# Patient Record
Sex: Male | Born: 1970 | Race: Black or African American | Hispanic: No | Marital: Single | Smoking: Former smoker
Health system: Southern US, Community
[De-identification: ages and names within clinical notes are randomized; demographics above are authoritative.]

## PROBLEM LIST (undated history)

## (undated) DIAGNOSIS — R7989 Other specified abnormal findings of blood chemistry: Secondary | ICD-10-CM

## (undated) DIAGNOSIS — F1010 Alcohol abuse, uncomplicated: Secondary | ICD-10-CM

## (undated) DIAGNOSIS — F39 Unspecified mood [affective] disorder: Secondary | ICD-10-CM

## (undated) DIAGNOSIS — M069 Rheumatoid arthritis, unspecified: Secondary | ICD-10-CM

## (undated) DIAGNOSIS — F329 Major depressive disorder, single episode, unspecified: Secondary | ICD-10-CM

## (undated) DIAGNOSIS — S069XAA Unspecified intracranial injury with loss of consciousness status unknown, initial encounter: Secondary | ICD-10-CM

## (undated) DIAGNOSIS — F32A Depression, unspecified: Secondary | ICD-10-CM

## (undated) DIAGNOSIS — I209 Angina pectoris, unspecified: Secondary | ICD-10-CM

## (undated) DIAGNOSIS — E669 Obesity, unspecified: Secondary | ICD-10-CM

## (undated) DIAGNOSIS — S069X9A Unspecified intracranial injury with loss of consciousness of unspecified duration, initial encounter: Secondary | ICD-10-CM

## (undated) DIAGNOSIS — N529 Male erectile dysfunction, unspecified: Secondary | ICD-10-CM

## (undated) DIAGNOSIS — I2510 Atherosclerotic heart disease of native coronary artery without angina pectoris: Secondary | ICD-10-CM

## (undated) DIAGNOSIS — R945 Abnormal results of liver function studies: Secondary | ICD-10-CM

## (undated) DIAGNOSIS — E785 Hyperlipidemia, unspecified: Secondary | ICD-10-CM

## (undated) DIAGNOSIS — I10 Essential (primary) hypertension: Secondary | ICD-10-CM

## (undated) HISTORY — DX: Unspecified mood (affective) disorder: F39

## (undated) HISTORY — DX: Male erectile dysfunction, unspecified: N52.9

## (undated) HISTORY — PX: FRACTURE SURGERY: SHX138

## (undated) HISTORY — DX: Other specified abnormal findings of blood chemistry: R79.89

## (undated) HISTORY — DX: Hyperlipidemia, unspecified: E78.5

## (undated) HISTORY — DX: Angina pectoris, unspecified: I20.9

## (undated) HISTORY — PX: FOREARM FRACTURE SURGERY: SHX649

## (undated) HISTORY — DX: Alcohol abuse, uncomplicated: F10.10

## (undated) HISTORY — PX: CORONARY ANGIOPLASTY: SHX604

## (undated) HISTORY — DX: Obesity, unspecified: E66.9

## (undated) HISTORY — DX: Abnormal results of liver function studies: R94.5

---

## 2004-07-12 ENCOUNTER — Inpatient Hospital Stay (HOSPITAL_COMMUNITY): Admission: EM | Admit: 2004-07-12 | Discharge: 2004-07-22 | Payer: Self-pay

## 2004-07-22 ENCOUNTER — Ambulatory Visit: Payer: Self-pay | Admitting: Physical Medicine & Rehabilitation

## 2004-07-22 ENCOUNTER — Inpatient Hospital Stay (HOSPITAL_COMMUNITY)
Admission: RE | Admit: 2004-07-22 | Discharge: 2004-08-11 | Payer: Self-pay | Admitting: Physical Medicine & Rehabilitation

## 2004-08-18 ENCOUNTER — Encounter
Admission: RE | Admit: 2004-08-18 | Discharge: 2004-11-16 | Payer: Self-pay | Admitting: Physical Medicine & Rehabilitation

## 2004-09-15 ENCOUNTER — Encounter
Admission: RE | Admit: 2004-09-15 | Discharge: 2004-12-14 | Payer: Self-pay | Admitting: Physical Medicine & Rehabilitation

## 2004-09-17 ENCOUNTER — Ambulatory Visit: Payer: Self-pay | Admitting: Physical Medicine & Rehabilitation

## 2004-10-07 ENCOUNTER — Encounter: Admission: RE | Admit: 2004-10-07 | Discharge: 2005-01-05 | Payer: Self-pay | Admitting: Sports Medicine

## 2004-10-29 ENCOUNTER — Ambulatory Visit: Payer: Self-pay | Admitting: Physical Medicine & Rehabilitation

## 2005-01-23 ENCOUNTER — Encounter
Admission: RE | Admit: 2005-01-23 | Discharge: 2005-04-23 | Payer: Self-pay | Admitting: Physical Medicine & Rehabilitation

## 2005-01-27 ENCOUNTER — Ambulatory Visit: Payer: Self-pay | Admitting: Physical Medicine & Rehabilitation

## 2005-03-27 ENCOUNTER — Ambulatory Visit: Payer: Self-pay | Admitting: Physical Medicine & Rehabilitation

## 2005-06-24 ENCOUNTER — Encounter
Admission: RE | Admit: 2005-06-24 | Discharge: 2005-09-22 | Payer: Self-pay | Admitting: Physical Medicine & Rehabilitation

## 2005-06-24 ENCOUNTER — Ambulatory Visit: Payer: Self-pay | Admitting: Physical Medicine & Rehabilitation

## 2005-09-22 ENCOUNTER — Encounter
Admission: RE | Admit: 2005-09-22 | Discharge: 2005-12-21 | Payer: Self-pay | Admitting: Physical Medicine & Rehabilitation

## 2005-09-22 ENCOUNTER — Ambulatory Visit: Payer: Self-pay | Admitting: Physical Medicine & Rehabilitation

## 2006-03-08 ENCOUNTER — Encounter
Admission: RE | Admit: 2006-03-08 | Discharge: 2006-06-06 | Payer: Self-pay | Admitting: Physical Medicine & Rehabilitation

## 2006-03-08 ENCOUNTER — Ambulatory Visit: Payer: Self-pay | Admitting: Physical Medicine & Rehabilitation

## 2006-06-24 ENCOUNTER — Encounter
Admission: RE | Admit: 2006-06-24 | Discharge: 2006-09-22 | Payer: Self-pay | Admitting: Physical Medicine & Rehabilitation

## 2006-06-24 ENCOUNTER — Ambulatory Visit: Payer: Self-pay | Admitting: Physical Medicine & Rehabilitation

## 2006-09-24 ENCOUNTER — Encounter
Admission: RE | Admit: 2006-09-24 | Discharge: 2006-12-23 | Payer: Self-pay | Admitting: Physical Medicine & Rehabilitation

## 2006-09-24 ENCOUNTER — Ambulatory Visit: Payer: Self-pay | Admitting: Physical Medicine & Rehabilitation

## 2007-01-03 ENCOUNTER — Ambulatory Visit: Payer: Self-pay | Admitting: Physical Medicine & Rehabilitation

## 2007-01-03 ENCOUNTER — Encounter
Admission: RE | Admit: 2007-01-03 | Discharge: 2007-04-03 | Payer: Self-pay | Admitting: Physical Medicine & Rehabilitation

## 2007-02-28 ENCOUNTER — Ambulatory Visit: Payer: Self-pay | Admitting: Physical Medicine & Rehabilitation

## 2007-06-28 ENCOUNTER — Ambulatory Visit: Payer: Self-pay | Admitting: Physical Medicine & Rehabilitation

## 2007-06-28 ENCOUNTER — Encounter
Admission: RE | Admit: 2007-06-28 | Discharge: 2007-09-26 | Payer: Self-pay | Admitting: Physical Medicine & Rehabilitation

## 2007-08-22 ENCOUNTER — Ambulatory Visit: Payer: Self-pay | Admitting: Physical Medicine & Rehabilitation

## 2007-12-22 ENCOUNTER — Ambulatory Visit: Payer: Self-pay | Admitting: Physical Medicine & Rehabilitation

## 2007-12-22 ENCOUNTER — Encounter
Admission: RE | Admit: 2007-12-22 | Discharge: 2007-12-23 | Payer: Self-pay | Admitting: Physical Medicine & Rehabilitation

## 2008-06-15 ENCOUNTER — Encounter
Admission: RE | Admit: 2008-06-15 | Discharge: 2008-06-18 | Payer: Self-pay | Admitting: Physical Medicine & Rehabilitation

## 2008-06-18 ENCOUNTER — Ambulatory Visit: Payer: Self-pay | Admitting: Physical Medicine & Rehabilitation

## 2008-12-06 ENCOUNTER — Encounter
Admission: RE | Admit: 2008-12-06 | Discharge: 2008-12-07 | Payer: Self-pay | Admitting: Physical Medicine & Rehabilitation

## 2008-12-07 ENCOUNTER — Ambulatory Visit: Payer: Self-pay | Admitting: Physical Medicine & Rehabilitation

## 2009-06-12 ENCOUNTER — Ambulatory Visit: Payer: Self-pay | Admitting: Physical Medicine & Rehabilitation

## 2009-06-12 ENCOUNTER — Encounter
Admission: RE | Admit: 2009-06-12 | Discharge: 2009-06-12 | Payer: Self-pay | Admitting: Physical Medicine & Rehabilitation

## 2009-10-03 ENCOUNTER — Encounter
Admission: RE | Admit: 2009-10-03 | Discharge: 2009-11-26 | Payer: Self-pay | Admitting: Physical Medicine & Rehabilitation

## 2009-10-04 ENCOUNTER — Ambulatory Visit: Payer: Self-pay | Admitting: Physical Medicine & Rehabilitation

## 2009-12-31 ENCOUNTER — Encounter
Admission: RE | Admit: 2009-12-31 | Discharge: 2010-01-01 | Payer: Self-pay | Admitting: Physical Medicine & Rehabilitation

## 2010-01-01 ENCOUNTER — Ambulatory Visit: Payer: Self-pay | Admitting: Physical Medicine & Rehabilitation

## 2010-04-25 ENCOUNTER — Encounter
Admission: RE | Admit: 2010-04-25 | Discharge: 2010-04-30 | Payer: Self-pay | Admitting: Physical Medicine & Rehabilitation

## 2010-04-30 ENCOUNTER — Ambulatory Visit: Payer: Self-pay | Admitting: Physical Medicine & Rehabilitation

## 2010-10-14 ENCOUNTER — Encounter
Admission: RE | Admit: 2010-10-14 | Discharge: 2010-10-22 | Payer: Self-pay | Admitting: Physical Medicine & Rehabilitation

## 2010-10-22 ENCOUNTER — Ambulatory Visit: Payer: Self-pay | Admitting: Physical Medicine & Rehabilitation

## 2011-04-14 NOTE — Assessment & Plan Note (Signed)
Logan Sanchez is back regarding his brain injury.  He has been doing well from a  cognitive standpoint.  He continues taking his ritalin on a daily basis  as it helps his attention substantially.  He is on 10 mg b.i.d.  His  mood has been good.  He continues on Paxil 20 mg daily.  The patient  recently was laid off, however, he is hoping he will back at work in the  next couple of weeks.  He states he has lost some weight, but not  dramatically so.  He last worked on December 03, 2008.   REVIEW OF SYSTEMS:  Notable for occasional confusion and some weight  loss.  Other pertinent positives as above.  Full review is written out  in history section in the chart.   SOCIAL HISTORY:  The patient is single, still smokes one-pack per day.  He has a girlfriend and seems to be doing well in that area.   PHYSICAL EXAMINATION:  VITAL SIGNS:  Blood pressure is 172/95, pulse is  96, respiratory rate 18, and saturating 100% on room air.  GENERAL:  The patient is pleasant, alert, and oriented x3.  Affect is  quite appropriate.  Strength is 5/5.  He has good sensation.  Insight  and awareness are excellent.  He continues occasionally have problems  with focus, but is at baseline and seems to be compensating quite well.  HEART:  Regular.  CHEST:  Clear.  ABDOMEN:  Soft and nontender.  He has lost little bit of weight.   ASSESSMENT:  1. Traumatic brain injury.  2. Left partial rotator cuff tear.  3. Hypertension.   PLAN:  1. Continue Ritalin 10 b.i.d. and Paxil 20 mg daily.  These were      scheduled and not p.r.n. medications.  2. I will see him back in 6 months here for followup.  I have not pick      up refills in approximately 2 months' time.  I encouraged him      during his downtime to work on exercise and weight loss, which will      help lower his blood pressure.  If he has persistent blood pressure      elevation, he is to establish a PCP.      Ranelle Oyster, M.D.  Electronically  Signed     ZTS/MedQ  D:  12/07/2008 12:00:40  T:  12/08/2008 01:46:35  Job #:  [id number]

## 2011-04-14 NOTE — Assessment & Plan Note (Signed)
Logan Sanchez is here for follow up of his TBI.  He is having some problems with  anxiety and ongoing concerns of his memory.  He gets anxious over the  fact that he might have forgotten something.  He has in fact overdrafted  his bank account on several occasions.  He obsesses sometimes over  issues such whether he left his keys in the car or not or he left  something on in the house perhaps.  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  He is not having any pain at this point.  He is  employer is apparently happy with his work.   REVIEW OF SYSTEMS:  Notable for the above.  He continues to have  problems with his weight.  Full review is in the written health history  section of the chart.   SOCIAL HISTORY:  As noted above.  The patient is still smoking.  He  lives alone.   PHYSICAL EXAMINATION:  VITAL SIGNS:  Blood pressure 137/83, pulse 88,  respirations 18, sat 97% on room air.  GENERAL:  The patient is pleasant, alert and oriented x3.  NEUROLOGIC:  Affect is bright and appropriate.  Gait is generally  stable.  He is a bit anxious overall.  The sensory exam is within normal  limits.  Reflexes are 2+.  Motor functions 5/5.  The patient does have  problems with details and loses focus easily.  HEART:  Regular.  CHEST:  Clear.  ABDOMEN:  Soft and nontender.   ASSESSMENT:  1. Status post traumatic brain injury with ongoing memory and      attention deficits.  2. History of left partial rotator cuff tear.  3. Hypertension.   PLAN:  1. Continue to discuss issues to organize his life.  I think Ehsan has      to have a organization log or calendar that he keeps with him on a      regular basis.  He needs to set himself for success rather than      trying to wing things as he once did.  We discussed potential      options today.  2. Refilled Ritalin 10 mg twice a day.  3. We will begin trial Paxil 28 mg 1 p.o. q.a.m. for mood and anxiety.  4. I will see him back in about two  months.      Ranelle Oyster, M.D.  Electronically Signed     ZTS/MedQ  D:  06/29/2007 11:32:32  T:  06/30/2007 09:43:58  Job #:  [id number]
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
## 2011-04-14 NOTE — Assessment & Plan Note (Signed)
Logan Sanchez is back regarding his TBI. He is here today on 08/24/2007. Lyman  has done really well with the addition of the Paxil. It has decreased  his anxiety and depression quite significantly and it seems to be  working well with the Ritalin to give him better focus and better people  skills at work. He denies pain today. He is keeping a memory  book/calendar which is helping as well. He has had occasional diarrhea  he feels through the Paxil, but this is more than tolerable and really  minimal at this time. Cadan states that he is happy in his job and  appears to like it much more than he had previously. He is still working  30 hours per week.   REVIEW OF SYSTEMS:  Notable for the above. He does have occasional  tingling in the extremities. Full review is in the written health and  history section of the chart.   SOCIAL HISTORY:  The patient is living alone. He is still smoking a half  pack of cigarettes per day and work history is as noted above.   PHYSICAL EXAMINATION:  VITAL SIGNS:  Blood pressure 153/84, pulse 100,  respiratory rate 18. He is saturating 97% on room air.  The patient is pleasant. Awake, alert, and oriented x3. Affect is bright  and appropriate. Gait is stable. His mood is very good. Focus is  improved. He is still very pleasant to speak with.  HEART:  Regular rhythm, slightly tachycardic.  CHEST:  Clear.  ABDOMEN:  Soft and nontender.   ASSESSMENT:  1. Status post TBI.  2. History of left partial rotator cuff tear.  3. Hypertension.  4. Anxiety/depression.   PLAN:  Continue Ritalin and Paxil as ordered. I refilled Ritalin twice.  He will come back in two months to refill the Ritalin again and I will  see him in four months in the office for follow up.      Ranelle Oyster, M.D.  Electronically Signed     ZTS/MedQ  D:  08/24/2007 12:07:17  T:  08/25/2007 00:41:10  Job #:  [id number]
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
## 2012-08-15 ENCOUNTER — Encounter (HOSPITAL_COMMUNITY): Payer: Self-pay | Admitting: *Deleted

## 2012-08-15 ENCOUNTER — Emergency Department (HOSPITAL_COMMUNITY): Payer: Medicare Other

## 2012-08-15 ENCOUNTER — Emergency Department (HOSPITAL_COMMUNITY)
Admission: EM | Admit: 2012-08-15 | Discharge: 2012-08-15 | Disposition: A | Discharge status: Home / Self Care | Payer: Medicare Other | Attending: Emergency Medicine | Admitting: Emergency Medicine

## 2012-08-15 DIAGNOSIS — M79606 Pain in leg, unspecified: Secondary | ICD-10-CM

## 2012-08-15 DIAGNOSIS — F17200 Nicotine dependence, unspecified, uncomplicated: Secondary | ICD-10-CM | POA: Insufficient documentation

## 2012-08-15 DIAGNOSIS — M79609 Pain in unspecified limb: Secondary | ICD-10-CM | POA: Insufficient documentation

## 2012-08-15 DIAGNOSIS — I10 Essential (primary) hypertension: Secondary | ICD-10-CM | POA: Insufficient documentation

## 2012-08-15 HISTORY — DX: Essential (primary) hypertension: I10

## 2012-08-15 HISTORY — DX: Unspecified intracranial injury with loss of consciousness of unspecified duration, initial encounter: S06.9X9A

## 2012-08-15 HISTORY — DX: Unspecified intracranial injury with loss of consciousness status unknown, initial encounter: S06.9XAA

## 2012-08-15 MED ORDER — IBUPROFEN 400 MG PO TABS
800.0000 mg | ORAL_TABLET | Freq: Once | ORAL | Status: AC
  Administered 2012-08-15: 800 mg via ORAL
  Filled 2012-08-15: qty 2
  Filled 2012-08-15: qty 1

## 2012-08-15 NOTE — ED Provider Notes (Addendum)
History  This chart was scribed for Richardean Canal, MD by Bennett Scrape. This patient was seen in room [facility] and the patient's care was started at 2011.  CSN: [id number]  Arrival date & time 08/15/12  1745   First MD Initiated Contact with Patient 08/15/12 2011      Chief Complaint  Patient presents with  . Leg Pain    The history is provided by the patient. No language interpreter was used.    Logan Sanchez is a 41 y.o. male who presents to the Emergency Department complaining of 3 weeks of left buttock pain that radiates into his posterior left thigh, left knee and left calf that noticed at work. He denies any recent falls or injuries. He reports taking tylenol with no improvement in his symptoms. He denies having prior episodes of similar symptoms. He states that he has been going to [facility] for high cholesterol and told to come to the ED for possible DVT. He denies fever, nausea, emesis, chills and rash as associated symptoms. He has a h/o brain injury and HTN, last visit to PCP was one year ago.  He is a current everyday smoker and occasional alcohol user.  Past Medical History  Diagnosis Date  . Brain injury 2005  . Hypertension     History reviewed. No pertinent past surgical history.  No family history on file.  History  Substance Use Topics  . Smoking status: Current Every Day Smoker  . Smokeless tobacco: Not on file  . Alcohol Use: Yes      Review of Systems  Constitutional: Negative for fever and chills.  Gastrointestinal: Negative for nausea and vomiting.  Musculoskeletal: Negative for back pain.       Positive for left buttock, left thigh, left knee and left calf pain  Skin: Negative for rash.    Allergies  Review of patient's allergies indicates no known allergies.  Home Medications   Current Outpatient Rx  Name Route Sig Dispense Refill  . OMEGA-3 FATTY ACIDS 1000 MG PO CAPS Oral Take 1 g by mouth daily.    Marland Kitchen GNP GINGKO BILOBA EXTRACT  PO Oral Take 1 capsule by mouth daily.    Marland Kitchen GINSENG COMPLEX PO Oral Take 1 tablet by mouth daily.      Triage Vitals: BP 148/104  Pulse 114  Temp 98.6 F (37 C) (Oral)  Resp 18  SpO2 99%  Physical Exam  Nursing note and vitals reviewed. Constitutional: He is oriented to person, place, and time. He appears well-developed and well-nourished. No distress.  HENT:  Head: Normocephalic and atraumatic.  Eyes: EOM are normal.  Neck: Neck supple. No tracheal deviation present.  Cardiovascular: Normal rate.   Pulmonary/Chest: Effort normal. No respiratory distress.  Musculoskeletal: Normal range of motion. He exhibits tenderness.       Mild tenderness along the left posterior calf, mild tenderness along the left anterior thigh, no pain with ROM of hip or left knee  Neurological: He is alert and oriented to person, place, and time.  Skin: Skin is warm and dry.  Psychiatric: He has a normal mood and affect. His behavior is normal.    ED Course  Procedures (including critical care time)  DIAGNOSTIC STUDIES: Oxygen Saturation is 99% on room air, normal by my interpretation.    COORDINATION OF CARE: 2017-Discussed treatment plan which includes DVT procedures and x-rays with pt at bedside and pt agreed to plan.   Labs Reviewed - No data to display  Dg Hip Complete Left  08/15/2012  *RADIOLOGY REPORT*  Clinical Data: Left hip pain.  LEFT HIP - COMPLETE 2+ VIEW  Comparison: None.  Findings: Imaged bones, joints and soft tissues appear normal.  IMPRESSION: Negative exam.   Original Report Authenticated By: Bernadene Bell. D'ALESSIO, M.D.      1. Leg pain       MDM  Logan Sanchez is a 41 y.o. male here with chronic L leg pain likely MSK. Xrays showed no fracture. Low suspicion for DVT given patient has no other risk factors. Since vascular lab is closed, I ordered duplex for AM. I offered lovenox shot but patient refused. Patient will come in AM to get duplex of L leg to r/o DVT. Patient's HR  decreased to 97 on discharge as per nurse (triage HR elevated likely due to pain).   This document was completed by the scribe at my direction and I have reviewed its accuracy. I have personally examined the patient and agrees with the above document.   Chaney Malling, MD        Richardean Canal, MD 08/15/12 2134  Richardean Canal, MD 08/15/12 2209

## 2012-08-15 NOTE — ED Notes (Signed)
The pt has had lt leg pain for 2 months.  No known injury.  He has insurance so he wanted to check it out.  Past brain injury

## 2012-08-16 ENCOUNTER — Ambulatory Visit (HOSPITAL_COMMUNITY)
Admission: RE | Admit: 2012-08-16 | Discharge: 2012-08-16 | Disposition: A | Discharge status: Home / Self Care | Payer: Medicare Other | Source: Ambulatory Visit | Attending: Emergency Medicine | Admitting: Emergency Medicine

## 2012-08-16 DIAGNOSIS — M79609 Pain in unspecified limb: Secondary | ICD-10-CM

## 2012-08-16 NOTE — Progress Notes (Signed)
*  Preliminary Results* Left lower extremity venous duplex completed. Left lower extremity is negative for deep vein thrombosis.  08/16/2012 10:29 AM Gertie Fey, RDMS, RDCS

## 2012-12-28 ENCOUNTER — Ambulatory Visit: Payer: Medicare Other | Admitting: Family

## 2013-01-02 ENCOUNTER — Encounter: Payer: Self-pay | Admitting: Family

## 2013-01-02 ENCOUNTER — Ambulatory Visit (INDEPENDENT_AMBULATORY_CARE_PROVIDER_SITE_OTHER): Payer: Medicare Other | Admitting: Family

## 2013-01-02 VITALS — BP 176/102 | HR 103 | Temp 98.6°F | Ht 68.0 in | Wt 310.0 lb

## 2013-01-02 DIAGNOSIS — E669 Obesity, unspecified: Secondary | ICD-10-CM

## 2013-01-02 DIAGNOSIS — M545 Low back pain, unspecified: Secondary | ICD-10-CM

## 2013-01-02 DIAGNOSIS — M5430 Sciatica, unspecified side: Secondary | ICD-10-CM

## 2013-01-02 DIAGNOSIS — I10 Essential (primary) hypertension: Secondary | ICD-10-CM

## 2013-01-02 LAB — POCT URINALYSIS DIPSTICK
Blood, UA: NEGATIVE
Glucose, UA: NEGATIVE
Ketones, UA: NEGATIVE
Spec Grav, UA: >=1.03
Urobilinogen, UA: 1
pH, UA: 5.5

## 2013-01-02 LAB — COMPREHENSIVE METABOLIC PANEL
ALT: 43 U/L (ref 0–53)
Albumin: 3.8 g/dL (ref 3.5–5.2)
Alkaline Phosphatase: 92 U/L (ref 39–117)
BUN: 13 mg/dL (ref 6–23)
CO2: 26 mEq/L (ref 19–32)
Chloride: 102 mEq/L (ref 96–112)
GFR: 91.2 mL/min (ref >60.00)
Glucose, Bld: 112 mg/dL — ABNORMAL HIGH (ref 70–99)
Potassium: 4 mEq/L (ref 3.5–5.1)
Total Bilirubin: 0.9 mg/dL (ref 0.3–1.2)

## 2013-01-02 LAB — CBC WITH DIFFERENTIAL/PLATELET
Basophils Absolute: 0.1 10*3/uL (ref 0.0–0.1)
Basophils Relative: 1.1 % (ref 0.0–3.0)
Eosinophils Relative: 4.1 % (ref 0.0–5.0)
HCT: 44.4 % (ref 39.0–52.0)
Lymphocytes Relative: 36.9 % (ref 12.0–46.0)
Lymphs Abs: 2.5 10*3/uL (ref 0.7–4.0)
MCHC: 35 g/dL (ref 30.0–36.0)
Monocytes Absolute: 0.4 10*3/uL (ref 0.1–1.0)
Neutro Abs: 3.5 10*3/uL (ref 1.4–7.7)
Neutrophils Relative %: 51.4 % (ref 43.0–77.0)

## 2013-01-02 LAB — LIPID PANEL: Triglycerides: 597 mg/dL — ABNORMAL HIGH (ref 0.0–149.0)

## 2013-01-02 LAB — LDL CHOLESTEROL, DIRECT: Direct LDL: 112.5 mg/dL

## 2013-01-02 LAB — TSH: TSH: 1.1 u[IU]/mL (ref 0.35–5.50)

## 2013-01-02 MED ORDER — DICLOFENAC SODIUM 75 MG PO TBEC: 75.0000 mg | DELAYED_RELEASE_TABLET | Freq: Two times a day (BID) | ORAL | Status: DC

## 2013-01-02 MED ORDER — LISINOPRIL-HYDROCHLOROTHIAZIDE 20-25 MG PO TABS: 1.0000 | ORAL_TABLET | Freq: Every day | ORAL | Status: DC

## 2013-01-02 MED ORDER — CYCLOBENZAPRINE HCL 10 MG PO TABS: 10.0000 mg | ORAL_TABLET | Freq: Three times a day (TID) | ORAL | Status: DC | PRN

## 2013-01-02 NOTE — Patient Instructions (Addendum)
Sciatica Sciatica is pain, weakness, numbness, or tingling along the path of the sciatic nerve. The nerve starts in the lower back and runs down the back of each leg. The nerve controls the muscles in the lower leg and in the back of the knee, while also providing sensation to the back of the thigh, lower leg, and the sole of your foot. Sciatica is a symptom of another medical condition. For instance, nerve damage or certain conditions, such as a herniated disk or bone spur on the spine, pinch or put pressure on the sciatic nerve. This causes the pain, weakness, or other sensations normally associated with sciatica. Generally, sciatica only affects one side of the body. CAUSES   Herniated or slipped disc.  Degenerative disk disease.  A pain disorder involving the narrow muscle in the buttocks (piriformis syndrome).  Pelvic injury or fracture.  Pregnancy.  Tumor (rare). SYMPTOMS  Symptoms can vary from mild to very severe. The symptoms usually travel from the low back to the buttocks and down the back of the leg. Symptoms can include:  Mild tingling or dull aches in the lower back, leg, or hip.  Numbness in the back of the calf or sole of the foot.  Burning sensations in the lower back, leg, or hip.  Sharp pains in the lower back, leg, or hip.  Leg weakness.  Severe back pain inhibiting movement. These symptoms may get worse with coughing, sneezing, laughing, or prolonged sitting or standing. Also, being overweight may worsen symptoms. DIAGNOSIS  Your caregiver will perform a physical exam to look for common symptoms of sciatica. He or she may ask you to do certain movements or activities that would trigger sciatic nerve pain. Other tests may be performed to find the cause of the sciatica. These may include:  Blood tests.  X-rays.  Imaging tests, such as an MRI or CT scan. TREATMENT  Treatment is directed at the cause of the sciatic pain. Sometimes, treatment is not necessary  and the pain and discomfort goes away on its own. If treatment is needed, your caregiver may suggest:  Over-the-counter medicines to relieve pain.  Prescription medicines, such as anti-inflammatory medicine, muscle relaxants, or narcotics.  Applying heat or ice to the painful area.  Steroid injections to lessen pain, irritation, and inflammation around the nerve.  Reducing activity during periods of pain.  Exercising and stretching to strengthen your abdomen and improve flexibility of your spine. Your caregiver may suggest losing weight if the extra weight makes the back pain worse.  Physical therapy.  Surgery to eliminate what is pressing or pinching the nerve, such as a bone spur or part of a herniated disk. HOME CARE INSTRUCTIONS   Only take over-the-counter or prescription medicines for pain or discomfort as directed by your caregiver.  Apply ice to the affected area for 20 minutes, 3 4 times a day for the first 48 72 hours. Then try heat in the same way.  Exercise, stretch, or perform your usual activities if these do not aggravate your pain.  Attend physical therapy sessions as directed by your caregiver.  Keep all follow-up appointments as directed by your caregiver.  Do not wear high heels or shoes that do not provide proper support.  Check your mattress to see if it is too soft. A firm mattress may lessen your pain and discomfort. SEEK IMMEDIATE MEDICAL CARE IF:   You lose control of your bowel or bladder (incontinence).  You have increasing weakness in the lower back,   pelvis, buttocks, or legs.  You have redness or swelling of your back.  You have a burning sensation when you urinate.  You have pain that gets worse when you lie down or awakens you at night.  Your pain is worse than you have experienced in the past.  Your pain is lasting longer than 4 weeks.  You are suddenly losing weight without reason. MAKE SURE YOU:  Understand these  instructions.  Will watch your condition.  Will get help right away if you are not doing well or get worse. Document Released: 11/10/2001 Document Revised: 05/17/2012 Document Reviewed: 03/27/2012 [facility] Patient Information 2013 [place], [place].   Exercise to Lose Weight Exercise and a healthy diet may help you lose weight. Your doctor may suggest specific exercises. EXERCISE IDEAS AND TIPS  Choose low-cost things you enjoy doing, such as walking, bicycling, or exercising to workout videos.  Take stairs instead of the elevator.  Walk during your lunch break.  Park your car further away from work or school.  Go to a gym or an exercise class.  Start with 5 to 10 minutes of exercise each day. Build up to 30 minutes of exercise 4 to 6 days a week.  Wear shoes with good support and comfortable clothes.  Stretch before and after working out.  Work out until you breathe harder and your heart beats faster.  Drink extra water when you exercise.  Do not do so much that you hurt yourself, feel dizzy, or get very short of breath. Exercises that burn about 150 calories:  Running 1  miles in 15 minutes.  Playing volleyball for 45 to 60 minutes.  Washing and waxing a car for 45 to 60 minutes.  Playing touch football for 45 minutes.  Walking 1  miles in 35 minutes.  Pushing a stroller 1  miles in 30 minutes.  Playing basketball for 30 minutes.  Raking leaves for 30 minutes.  Bicycling 5 miles in 30 minutes.  Walking 2 miles in 30 minutes.  Dancing for 30 minutes.  Shoveling snow for 15 minutes.  Swimming laps for 20 minutes.  Walking up stairs for 15 minutes.  Bicycling 4 miles in 15 minutes.  Gardening for 30 to 45 minutes.  Jumping rope for 15 minutes.  Washing windows or floors for 45 to 60 minutes. Document Released: 12/19/2010 Document Revised: 02/08/2012 Document Reviewed: 12/19/2010 [facility] Patient Information 2013 [place], [place].

## 2013-01-02 NOTE — Progress Notes (Signed)
  Subjective:    Patient ID: Logan Sanchez, male    DOB: [birth year], 42 y.o.   MRN: [id number]  HPI 42 year old AAM, nonsmoker, is in today to be established. He has a history of Hypertension and Obesity. Has been on blood pressure meds in the past but has been off x 2 years. He has c/o low back pain that radiates down his left leg x 1-2 months. Has been taking Aleve with no relief.  Rates the pain as 7/10, constant. Worse with movement. Reports weight gain over the last several months.    Review of Systems  Constitutional: Negative.   Respiratory: Negative.   Cardiovascular: Negative.   Musculoskeletal: Positive for back pain.       Left leg pain  Skin: Negative.   Neurological: Negative.   Hematological: Negative.   Psychiatric/Behavioral: Negative.    Past Medical History  Diagnosis Date  . Brain injury   . Hypertension     History   Social History  . Marital Status: Single    Spouse Name: N/A    Number of Children: N/A  . Years of Education: N/A   Occupational History  . Not on file.   Social History Main Topics  . Smoking status: Current Every Day Smoker  . Smokeless tobacco: Not on file  . Alcohol Use: Yes  . Drug Use:   . Sexually Active:    Other Topics Concern  . Not on file   Social History Narrative  . No narrative on file    No past surgical history on file.  No family history on file.  No Known Allergies  Current Outpatient Prescriptions on File Prior to Visit  Medication Sig Dispense Refill  . fish oil-omega-3 fatty acids 1000 MG capsule Take 1 g by mouth daily.      . Ginkgo Biloba (GNP GINGKO BILOBA EXTRACT PO) Take 1 capsule by mouth daily.      Marland Kitchen lisinopril-hydrochlorothiazide (PRINZIDE,ZESTORETIC) 20-25 MG per tablet Take 1 tablet by mouth daily.  30 tablet  3  . Misc Natural Products (GINSENG COMPLEX PO) Take 1 tablet by mouth daily.        BP 176/102  Pulse 103  Temp 98.6 F (37 C) (Oral)  Ht 5\' 8"  (1.727 m)  Wt 310 lb (140.615  kg)  BMI 47.14 kg/m2  SpO2 98%chart    Objective:   Physical Exam  Constitutional: He is oriented to person, place, and time. He appears well-developed and well-nourished.       Obese  HENT:  Right Ear: External ear normal.  Left Ear: External ear normal.  Nose: Nose normal.  Mouth/Throat: Oropharynx is clear and moist.  Neck: Normal range of motion. Neck supple.  Cardiovascular: Normal rate, regular rhythm and normal heart sounds.   Pulmonary/Chest: Effort normal and breath sounds normal.  Abdominal: Soft. Bowel sounds are normal.  Musculoskeletal: Normal range of motion.  Neurological: He is alert and oriented to person, place, and time. He has normal reflexes.  Skin: Skin is warm and dry.  Psychiatric: He has a normal mood and affect.          Assessment & Plan:  Assessment:  1. Hypertension-Uncontrolled 2. Obesity 3. Low back pain 4. Sciatica  Plan: Flexeril 10mg  three times a day. Voltaren 75mg  twice a day with food. Xray lspine.  Start Lisinopril HCT 20/25 once a day. Exercise to reduce weight. Labs sent. Return in 3 weeks for CPX

## 2013-01-04 ENCOUNTER — Other Ambulatory Visit: Payer: Self-pay | Admitting: Family

## 2013-01-04 MED ORDER — OMEGA-3-ACID ETHYL ESTERS 1 G PO CAPS: 2.0000 g | ORAL_CAPSULE | Freq: Two times a day (BID) | ORAL | Status: DC

## 2013-01-09 ENCOUNTER — Ambulatory Visit (HOSPITAL_COMMUNITY)
Admission: RE | Admit: 2013-01-09 | Discharge: 2013-01-09 | Disposition: A | Discharge status: Home / Self Care | Payer: Medicare Other | Source: Ambulatory Visit | Attending: Family | Admitting: Family

## 2013-01-09 DIAGNOSIS — M545 Low back pain, unspecified: Secondary | ICD-10-CM | POA: Insufficient documentation

## 2013-01-09 DIAGNOSIS — M5430 Sciatica, unspecified side: Secondary | ICD-10-CM

## 2013-01-23 ENCOUNTER — Ambulatory Visit (INDEPENDENT_AMBULATORY_CARE_PROVIDER_SITE_OTHER): Payer: Medicare Other | Admitting: Family

## 2013-01-23 ENCOUNTER — Encounter: Payer: Self-pay | Admitting: Family

## 2013-01-23 VITALS — BP 114/80 | HR 108 | Wt 299.5 lb

## 2013-01-23 DIAGNOSIS — M545 Low back pain: Secondary | ICD-10-CM

## 2013-01-23 MED ORDER — LOSARTAN POTASSIUM-HCTZ 100-12.5 MG PO TABS: 1.0000 | ORAL_TABLET | Freq: Every day | ORAL | Status: DC

## 2013-01-23 NOTE — Progress Notes (Signed)
Subjective:    Patient ID: Logan Sanchez, male    DOB: 05/16/1971, 42 y.o.   MRN: [id number]  HPI 42 year old [place] American male, nonsmoker is in for recheck of hypertension. He's currently on hydrochlorothiazide and lisinopril 20/25 once daily. He tolerates the medication well except having erectile dysfunction. He now has difficulty achieving and maintaining an erection. And denies any lightheadedness, dizziness, chest pain, palpitations, shortness of breath or edema.   Review of Systems  Constitutional: Negative.   HENT: Negative.   Respiratory: Negative.   Cardiovascular: Negative.   Endocrine: Negative.   Genitourinary: Negative.   Musculoskeletal: Negative.   Skin: Negative.   Neurological: Negative.   Hematological: Negative.   Psychiatric/Behavioral: Negative.    Past Medical History  Diagnosis Date  . Brain injury   . Hypertension     History   Social History  . Marital Status: Single    Spouse Name: N/A    Number of Children: N/A  . Years of Education: N/A   Occupational History  . Not on file.   Social History Main Topics  . Smoking status: Current Every Day Smoker  . Smokeless tobacco: Not on file  . Alcohol Use: Yes  . Drug Use:   . Sexually Active:    Other Topics Concern  . Not on file   Social History Narrative  . No narrative on file    History reviewed. No pertinent past surgical history.  No family history on file.  No Known Allergies  Current Outpatient Prescriptions on File Prior to Visit  Medication Sig Dispense Refill  . cyclobenzaprine (FLEXERIL) 10 MG tablet Take 1 tablet (10 mg total) by mouth 3 (three) times daily as needed for muscle spasms.  30 tablet  0  . diclofenac (VOLTAREN) 75 MG EC tablet Take 1 tablet (75 mg total) by mouth 2 (two) times daily.  60 tablet  1  . fish oil-omega-3 fatty acids 1000 MG capsule Take 1 g by mouth daily.      . Ginkgo Biloba (GNP GINGKO BILOBA EXTRACT PO) Take 1 capsule by mouth daily.       . Misc Natural Products (GINSENG COMPLEX PO) Take 1 tablet by mouth daily.      Marland Kitchen omega-3 acid ethyl esters (LOVAZA) 1 G capsule Take 2 capsules (2 g total) by mouth 2 (two) times daily.  120 capsule  3   No current facility-administered medications on file prior to visit.    BP 114/80  Pulse 108  Wt 299 lb 8 oz (135.852 kg)  BMI 45.55 kg/m2  SpO2 94%chart    Objective:   Physical Exam  Constitutional: He is oriented to person, place, and time. He appears well-developed and well-nourished.  HENT:  Right Ear: External ear normal.  Left Ear: External ear normal.  Nose: Nose normal.  Mouth/Throat: Oropharynx is clear and moist.  Neck: Normal range of motion. Neck supple. No thyromegaly present.  Cardiovascular: Normal rate, regular rhythm and normal heart sounds.   Pulmonary/Chest: Effort normal and breath sounds normal.  Abdominal: Soft. Bowel sounds are normal.  Musculoskeletal: Normal range of motion.  Neurological: He is alert and oriented to person, place, and time.  Skin: Skin is warm and dry.  Psychiatric: He has a normal mood and affect.          Assessment & Plan:  Assessment: 1. Hypertension 2. Erectile Dysfunction-uncontrolled  Plan: Testosterone level sent. DC lisinopril HCT. Try Hyzaar 100/12.5 once daily. Recheck in 6 weeks since  he was to continue on his current prescription. Sample of Cialis 5 mg once daily was given. We'll follow with patient in 6 weeks and sooner as needed.

## 2013-01-23 NOTE — Patient Instructions (Addendum)

## 2013-02-08 ENCOUNTER — Other Ambulatory Visit: Payer: Self-pay

## 2013-02-08 MED ORDER — CYCLOBENZAPRINE HCL 10 MG PO TABS: 10.0000 mg | ORAL_TABLET | Freq: Three times a day (TID) | ORAL | Status: DC | PRN

## 2013-03-06 ENCOUNTER — Ambulatory Visit (INDEPENDENT_AMBULATORY_CARE_PROVIDER_SITE_OTHER): Payer: Medicare Other | Admitting: Family

## 2013-03-06 ENCOUNTER — Encounter: Payer: Self-pay | Admitting: Family

## 2013-03-06 VITALS — BP 122/88 | HR 108 | Wt 303.0 lb

## 2013-03-06 DIAGNOSIS — N529 Male erectile dysfunction, unspecified: Secondary | ICD-10-CM

## 2013-03-06 DIAGNOSIS — M5430 Sciatica, unspecified side: Secondary | ICD-10-CM

## 2013-03-06 DIAGNOSIS — I10 Essential (primary) hypertension: Secondary | ICD-10-CM

## 2013-03-06 DIAGNOSIS — M5432 Sciatica, left side: Secondary | ICD-10-CM

## 2013-03-06 MED ORDER — LOSARTAN POTASSIUM-HCTZ 100-25 MG PO TABS: 1.0000 | ORAL_TABLET | Freq: Every day | ORAL | Status: DC

## 2013-03-06 NOTE — Progress Notes (Signed)
Subjective:    Patient ID: Logan Sanchez, male    DOB: 10-13-1971, 42 y.o.   MRN: [id number]  HPI 42 year old [place] American male, patient of Dr. Orvan Falconer that is in today for a blood pressure follow-up. He verbalizes he has gained a few pounds since last visit, attributing it it to cheating a little on his diet. Reports he has not refrained from adding salt to foods and has started drinking sodas again. He reports he consumes a bottle of red wine weekly. Denies headaches or edema in extremities. Plans to engage in walking activities when weather breaks.  Tolerating BP medications well, ED stable.  He has a history of sciatica and reports that the pain in his left buttocks and leg have improved with medications, reports pain only in the buttocks region, taking Voltaren 75mg  as needed,     Review of Systems  Constitutional: Negative.   HENT: Negative.   Eyes: Negative.   Respiratory: Negative.   Cardiovascular: Negative.   Gastrointestinal: Negative.   Endocrine: Negative.   Genitourinary: Negative.   Musculoskeletal: Positive for back pain.       Sciatica pain improved, pain only in buttocks region, no longer having pain in left leg,   Skin: Negative.   Allergic/Immunologic: Negative.   Neurological: Negative.   Hematological: Negative.   Psychiatric/Behavioral: Negative.    Past Medical History  Diagnosis Date  . Brain injury   . Hypertension     History   Social History  . Marital Status: Single    Spouse Name: N/A    Number of Children: N/A  . Years of Education: N/A   Occupational History  . Not on file.   Social History Main Topics  . Smoking status: Current Every Day Smoker  . Smokeless tobacco: Not on file  . Alcohol Use: Yes  . Drug Use:   . Sexually Active:    Other Topics Concern  . Not on file   Social History Narrative  . No narrative on file    History reviewed. No pertinent past surgical history.  No family history on file.  No Known  Allergies  Current Outpatient Prescriptions on File Prior to Visit  Medication Sig Dispense Refill  . cyclobenzaprine (FLEXERIL) 10 MG tablet Take 1 tablet (10 mg total) by mouth 3 (three) times daily as needed for muscle spasms.  30 tablet  0  . diclofenac (VOLTAREN) 75 MG EC tablet Take 1 tablet (75 mg total) by mouth 2 (two) times daily.  60 tablet  1  . fish oil-omega-3 fatty acids 1000 MG capsule Take 1 g by mouth daily.      . Ginkgo Biloba (GNP GINGKO BILOBA EXTRACT PO) Take 1 capsule by mouth daily.      . Misc Natural Products (GINSENG COMPLEX PO) Take 1 tablet by mouth daily.      Marland Kitchen omega-3 acid ethyl esters (LOVAZA) 1 G capsule Take 2 capsules (2 g total) by mouth 2 (two) times daily.  120 capsule  3   No current facility-administered medications on file prior to visit.    BP 122/88  Pulse 108  Wt 303 lb (137.44 kg)  BMI 46.08 kg/m2  SpO2 94%chart     Objective:   Physical Exam  Vitals reviewed. Constitutional: He is oriented to person, place, and time. He appears well-nourished. No distress.  HENT:  Head: Normocephalic.  Right Ear: External ear normal.  Left Ear: External ear normal.  Nose: Nose normal.  Mouth/Throat:  No oropharyngeal exudate.  Eyes: Pupils are equal, round, and reactive to light.  Neck: Normal range of motion. Neck supple. No JVD present. No tracheal deviation present. No thyromegaly present.  Cardiovascular: Normal rate, regular rhythm, normal heart sounds and intact distal pulses.   Pulmonary/Chest: Effort normal and breath sounds normal. No stridor.  Abdominal: Soft. Bowel sounds are normal.  Neurological: He is alert and oriented to person, place, and time.  Skin: Skin is warm and dry. He is not diaphoretic.  Psychiatric: He has a normal mood and affect. His behavior is normal. Judgment and thought content normal.          Assessment & Plan:  Assessment: 1. Essential Hypertension 2.  Sciatica left buttocks 3. Erectile  Dysfunction  Plan: Discussed weight loss needs with patient, encouraged decreased sodium intake by not adding salt to foods, cutting out sodas with high sodium content. Encouraged increase in exercise starting with increasing walking for 30 minutes a day. Increased losartan-hydrochlorothiazide to 100/25mg  po daily. Follow up in 3 mos for blood pressure and medication re-check  or sooner if needed.

## 2013-03-06 NOTE — Patient Instructions (Addendum)

## 2013-04-17 ENCOUNTER — Encounter: Payer: Self-pay | Admitting: Family

## 2013-04-17 ENCOUNTER — Ambulatory Visit (INDEPENDENT_AMBULATORY_CARE_PROVIDER_SITE_OTHER): Payer: Medicare Other | Admitting: Family

## 2013-04-17 VITALS — BP 122/84 | HR 91 | Wt 297.0 lb

## 2013-04-17 DIAGNOSIS — R739 Hyperglycemia, unspecified: Secondary | ICD-10-CM

## 2013-04-17 DIAGNOSIS — I10 Essential (primary) hypertension: Secondary | ICD-10-CM

## 2013-04-17 DIAGNOSIS — N529 Male erectile dysfunction, unspecified: Secondary | ICD-10-CM

## 2013-04-17 DIAGNOSIS — E7800 Pure hypercholesterolemia, unspecified: Secondary | ICD-10-CM

## 2013-04-17 DIAGNOSIS — R7309 Other abnormal glucose: Secondary | ICD-10-CM

## 2013-04-17 LAB — LIPID PANEL
Total CHOL/HDL Ratio: 9
Triglycerides: 292 mg/dL — ABNORMAL HIGH (ref 0.0–149.0)

## 2013-04-17 LAB — HEPATIC FUNCTION PANEL
AST: 24 U/L (ref 0–37)
Albumin: 3.8 g/dL (ref 3.5–5.2)
Total Bilirubin: 0.5 mg/dL (ref 0.3–1.2)

## 2013-04-17 LAB — LDL CHOLESTEROL, DIRECT: Direct LDL: 135 mg/dL

## 2013-04-17 NOTE — Progress Notes (Signed)
Subjective:    Patient ID: Logan Sanchez, male    DOB: 1971/11/03, 42 y.o.   MRN: [id number]  HPI  42 year old African American male, smoker, is in for recheck of hypertension, hyperlipidemia, erectile dysfunction, and obesity. She's currently taking losartan HCT and tolerating it well. Denies any concerns. Was previously on lisinopril that caused erectile dysfunction. He feels that has improved. He currently on Lovaza for cholesterol reduction. He tolerates the medication well. He is down 6 pounds from his last office visit. Reports exercising more recently. At his most recent labs, he was found to be hyperglycemic. Denies any polyuria or polydipsia.  Review of Systems  Constitutional: Negative.   HENT: Negative.   Respiratory: Negative.   Cardiovascular: Negative.   Gastrointestinal: Negative.   Endocrine: Negative.   Musculoskeletal: Negative.   Skin: Negative.   Allergic/Immunologic: Negative.   Neurological: Negative.   Hematological: Negative.   Psychiatric/Behavioral: Negative.    Past Medical History  Diagnosis Date  . Brain injury   . Hypertension     History   Social History  . Marital Status: Single    Spouse Name: N/A    Number of Children: N/A  . Years of Education: N/A   Occupational History  . Not on file.   Social History Main Topics  . Smoking status: Current Every Day Smoker  . Smokeless tobacco: Not on file  . Alcohol Use: Yes  . Drug Use:   . Sexually Active:    Other Topics Concern  . Not on file   Social History Narrative  . No narrative on file    History reviewed. No pertinent past surgical history.  No family history on file.  No Known Allergies  Current Outpatient Prescriptions on File Prior to Visit  Medication Sig Dispense Refill  . cyclobenzaprine (FLEXERIL) 10 MG tablet Take 1 tablet (10 mg total) by mouth 3 (three) times daily as needed for muscle spasms.  30 tablet  0  . diclofenac (VOLTAREN) 75 MG EC tablet Take 1 tablet  (75 mg total) by mouth 2 (two) times daily.  60 tablet  1  . fish oil-omega-3 fatty acids 1000 MG capsule Take 1 g by mouth daily.      . Ginkgo Biloba (GNP GINGKO BILOBA EXTRACT PO) Take 1 capsule by mouth daily.      Marland Kitchen losartan-hydrochlorothiazide (HYZAAR) 100-25 MG per tablet Take 1 tablet by mouth daily.  30 tablet  3  . Misc Natural Products (GINSENG COMPLEX PO) Take 1 tablet by mouth daily.      Marland Kitchen omega-3 acid ethyl esters (LOVAZA) 1 G capsule Take 2 capsules (2 g total) by mouth 2 (two) times daily.  120 capsule  3   No current facility-administered medications on file prior to visit.    BP 122/84  Pulse 91  Wt 297 lb (134.718 kg)  BMI 45.17 kg/m2  SpO2 97%chart     Objective:   Physical Exam  Constitutional: He is oriented to person, place, and time. He appears well-developed and well-nourished.  Neck: Normal range of motion. Neck supple. No thyromegaly present.  Cardiovascular: Normal rate, regular rhythm and normal heart sounds.   Pulmonary/Chest: Effort normal and breath sounds normal.  Abdominal: Soft. Bowel sounds are normal.  Musculoskeletal: Normal range of motion.  Neurological: He is alert and oriented to person, place, and time.  Skin: Skin is warm and dry.  Psychiatric: He has a normal mood and affect.  Assessment & Plan:  Assessment:  1. hypertension 2. Erectile dysfunction-improving 3. Obesity 4. Hyperlipidemia 5. Hyperglycemia  Plan: Lab sent to include BMP, lipids, LFTs, A1c will notify patient pending results. Continue healthy diet and exercise, continue weight reduction. We'll follow with patient in 4 months and sooner as needed.

## 2013-04-17 NOTE — Patient Instructions (Addendum)

## 2013-06-07 ENCOUNTER — Other Ambulatory Visit: Payer: Self-pay | Admitting: Family

## 2013-07-04 ENCOUNTER — Other Ambulatory Visit: Payer: Self-pay | Admitting: Family

## 2013-08-18 ENCOUNTER — Ambulatory Visit (INDEPENDENT_AMBULATORY_CARE_PROVIDER_SITE_OTHER): Payer: Medicare Other | Admitting: Family

## 2013-08-18 ENCOUNTER — Telehealth: Payer: Self-pay | Admitting: Family

## 2013-08-18 ENCOUNTER — Encounter: Payer: Self-pay | Admitting: Family

## 2013-08-18 VITALS — BP 120/90 | Temp 98.5°F | Wt 301.0 lb

## 2013-08-18 DIAGNOSIS — E7800 Pure hypercholesterolemia, unspecified: Secondary | ICD-10-CM

## 2013-08-18 DIAGNOSIS — I10 Essential (primary) hypertension: Secondary | ICD-10-CM

## 2013-08-18 DIAGNOSIS — N529 Male erectile dysfunction, unspecified: Secondary | ICD-10-CM

## 2013-08-18 NOTE — Progress Notes (Signed)
  Subjective:    Patient ID: Logan Sanchez, male    DOB: Jan 22, 1971, 42 y.o.   MRN: [id number]  HPI  42 year old African American male, nonsmoker is in today for recheck of hypertension, hypercholesterolemia, obesity, and erectile dysfunction. He reports he still in well. Denies any concerns today. He is not fasting.  Review of Systems  Constitutional: Negative.   HENT: Negative.   Respiratory: Negative.   Cardiovascular: Negative.   Gastrointestinal: Negative.   Endocrine: Negative.   Genitourinary: Negative.   Musculoskeletal: Negative.   Skin: Negative.   Neurological: Negative.   Hematological: Negative.   Psychiatric/Behavioral: Negative.    Past Medical History  Diagnosis Date  . Brain injury   . Hypertension     History   Social History  . Marital Status: Single    Spouse Name: N/A    Number of Children: N/A  . Years of Education: N/A   Occupational History  . Not on file.   Social History Main Topics  . Smoking status: Current Every Day Smoker  . Smokeless tobacco: Not on file  . Alcohol Use: Yes  . Drug Use:   . Sexual Activity:    Other Topics Concern  . Not on file   Social History Narrative  . No narrative on file    History reviewed. No pertinent past surgical history.  No family history on file.  No Known Allergies  Current Outpatient Prescriptions on File Prior to Visit  Medication Sig Dispense Refill  . fish oil-omega-3 fatty acids 1000 MG capsule Take 1 g by mouth daily.      . Ginkgo Biloba (GNP GINGKO BILOBA EXTRACT PO) Take 1 capsule by mouth daily.      Marland Kitchen losartan-hydrochlorothiazide (HYZAAR) 100-25 MG per tablet TAKE 1 TABLET BY MOUTH DAILY.  30 tablet  3  . Misc Natural Products (GINSENG COMPLEX PO) Take 1 tablet by mouth daily.      Marland Kitchen omega-3 acid ethyl esters (LOVAZA) 1 G capsule Take 2 capsules (2 g total) by mouth 2 (two) times daily.  120 capsule  3   No current facility-administered medications on file prior to visit.     BP 120/90  Temp(Src) 98.5 F (36.9 C) (Oral)  Wt 301 lb (136.533 kg)  BMI 45.78 kg/m2chart    Objective:   Physical Exam  Constitutional: He is oriented to person, place, and time. He appears well-developed and well-nourished.  HENT:  Right Ear: External ear normal.  Left Ear: External ear normal.  Nose: Nose normal.  Mouth/Throat: Oropharynx is clear and moist.  Neck: Normal range of motion. Neck supple. No thyromegaly present.  Cardiovascular: Normal rate, regular rhythm and normal heart sounds.   Pulmonary/Chest: Effort normal and breath sounds normal.  Abdominal: Soft. Bowel sounds are normal.  Musculoskeletal: Normal range of motion.  Neurological: He is alert and oriented to person, place, and time.  Skin: Skin is warm and dry.  Psychiatric: He has a normal mood and affect.          Assessment & Plan:  Assessment: 1. Hypertension 2 Hypercholesterolemia 3. Morbidly obese 4. Erectile dysfunction  Plan: We'll obtain lipids and liver function tests and lab only visit. Recheck in 6 months for office visit. Encouraged healthy diet, exercise, weight reduction. We'll followup with patient as needed sooner.

## 2013-08-18 NOTE — Telephone Encounter (Signed)
Opened in error

## 2013-08-21 ENCOUNTER — Other Ambulatory Visit (INDEPENDENT_AMBULATORY_CARE_PROVIDER_SITE_OTHER): Payer: Medicare Other

## 2013-08-21 DIAGNOSIS — E7800 Pure hypercholesterolemia, unspecified: Secondary | ICD-10-CM

## 2013-08-21 LAB — LIPID PANEL
HDL: 28.7 mg/dL — ABNORMAL LOW (ref >39.00)
Triglycerides: 459 mg/dL — ABNORMAL HIGH (ref 0.0–149.0)

## 2013-09-01 ENCOUNTER — Encounter: Payer: Self-pay | Admitting: Family

## 2013-09-26 ENCOUNTER — Other Ambulatory Visit: Payer: Self-pay

## 2013-09-26 MED ORDER — OMEGA-3-ACID ETHYL ESTERS 1 G PO CAPS: 2.0000 g | ORAL_CAPSULE | Freq: Two times a day (BID) | ORAL | Status: DC

## 2013-10-05 ENCOUNTER — Other Ambulatory Visit: Payer: Self-pay

## 2014-02-15 ENCOUNTER — Ambulatory Visit: Payer: Self-pay | Admitting: Family

## 2014-02-15 ENCOUNTER — Other Ambulatory Visit: Payer: Medicare Other

## 2014-02-19 ENCOUNTER — Encounter: Payer: Self-pay | Admitting: Family

## 2014-02-19 ENCOUNTER — Ambulatory Visit (INDEPENDENT_AMBULATORY_CARE_PROVIDER_SITE_OTHER): Payer: Commercial Managed Care - HMO | Admitting: Family

## 2014-02-19 VITALS — BP 142/92 | HR 71 | Wt 302.0 lb

## 2014-02-19 DIAGNOSIS — I10 Essential (primary) hypertension: Secondary | ICD-10-CM

## 2014-02-19 DIAGNOSIS — N529 Male erectile dysfunction, unspecified: Secondary | ICD-10-CM

## 2014-02-19 DIAGNOSIS — E785 Hyperlipidemia, unspecified: Secondary | ICD-10-CM

## 2014-02-19 LAB — COMPREHENSIVE METABOLIC PANEL
ALBUMIN: 4.1 g/dL (ref 3.5–5.2)
ALT: 38 U/L (ref 0–53)
AST: 32 U/L (ref 0–37)
Alkaline Phosphatase: 69 U/L (ref 39–117)
BILIRUBIN TOTAL: 0.7 mg/dL (ref 0.3–1.2)
BUN: 12 mg/dL (ref 6–23)
CHLORIDE: 104 meq/L (ref 96–112)
CO2: 27 meq/L (ref 19–32)
CREATININE: 1 mg/dL (ref 0.4–1.5)
Calcium: 9.2 mg/dL (ref 8.4–10.5)
GFR: 109.28 mL/min (ref >60.00)
Glucose, Bld: 93 mg/dL (ref 70–99)
POTASSIUM: 3.6 meq/L (ref 3.5–5.1)
Sodium: 137 mEq/L (ref 135–145)
TOTAL PROTEIN: 7.2 g/dL (ref 6.0–8.3)

## 2014-02-19 LAB — LIPID PANEL
CHOLESTEROL: 250 mg/dL — AB (ref 0–200)
HDL: 35 mg/dL — AB (ref >39.00)
LDL CALC: 112 mg/dL — AB (ref 0–99)
Total CHOL/HDL Ratio: 7
Triglycerides: 513 mg/dL — ABNORMAL HIGH (ref 0.0–149.0)
VLDL: 102.6 mg/dL — AB (ref 0.0–40.0)

## 2014-02-19 MED ORDER — SILDENAFIL CITRATE 100 MG PO TABS: 50.0000 mg | ORAL_TABLET | Freq: Every day | ORAL | Status: DC | PRN

## 2014-02-19 NOTE — Progress Notes (Signed)
Pre visit review using our clinic review tool, if applicable. No additional management support is needed unless otherwise documented below in the visit note. 

## 2014-02-19 NOTE — Progress Notes (Signed)
Subjective:    Patient ID: Logan Sanchez, male    DOB: [birth year], 43 y.o.   MRN: [id number]  HPI 43 y.o. Black male presents today for medication follow up for hypertension and hyperlipidemia. Pt currently takes Losartan-Hydrochlorothiazide 100-25mg  po, he states that he takes his medication most days but dose miss doses occasionally. Pt states that he has a "bad diet, I eat fried food a lot" he also states he has replaced drinking sodas with drinking juice. Pt states he is very concerned about not being able to loose weight, he denies exercising as well. Acknowledges erectile dysfunction which is very distressing for him. Denies headaches, fever, chills, fatigue and change in appetite.     Review of Systems  Constitutional: Negative.   HENT: Negative.   Eyes: Negative.   Respiratory: Negative.   Cardiovascular: Negative.   Gastrointestinal: Negative.   Endocrine: Negative.   Genitourinary:       Acknowledges erectile dysfunction.   Musculoskeletal: Negative.   Allergic/Immunologic: Negative.   Neurological: Negative.   Hematological: Negative.   Psychiatric/Behavioral: Negative.    Past Medical History  Diagnosis Date  . Brain injury   . Hypertension     History   Social History  . Marital Status: Single    Spouse Name: N/A    Number of Children: N/A  . Years of Education: N/A   Occupational History  . Not on file.   Social History Main Topics  . Smoking status: Current Every Day Smoker  . Smokeless tobacco: Not on file  . Alcohol Use: Yes  . Drug Use:   . Sexual Activity:    Other Topics Concern  . Not on file   Social History Narrative  . No narrative on file    No past surgical history on file.  No family history on file.  No Known Allergies  Current Outpatient Prescriptions on File Prior to Visit  Medication Sig Dispense Refill  . fish oil-omega-3 fatty acids 1000 MG capsule Take 1 g by mouth daily.      . Ginkgo Biloba (GNP GINGKO BILOBA  EXTRACT PO) Take 1 capsule by mouth daily.      Marland Kitchen. losartan-hydrochlorothiazide (HYZAAR) 100-25 MG per tablet TAKE 1 TABLET BY MOUTH DAILY.  30 tablet  3  . Misc Natural Products (GINSENG COMPLEX PO) Take 1 tablet by mouth daily.      Marland Kitchen. omega-3 acid ethyl esters (LOVAZA) 1 G capsule Take 2 capsules (2 g total) by mouth 2 (two) times daily.  120 capsule  3   No current facility-administered medications on file prior to visit.    BP 142/92  Pulse 71  Wt 302 lb (136.986 kg)  SpO2 98%chart    Objective:   Physical Exam  Constitutional: He is oriented to person, place, and time. He appears well-developed and well-nourished. He is active.  Cardiovascular: Normal rate, regular rhythm and normal heart sounds.   Pulmonary/Chest: Effort normal and breath sounds normal.  Neurological: He is alert and oriented to person, place, and time.  Skin: Skin is warm, dry and intact.  Psychiatric: He has a normal mood and affect. His speech is normal and behavior is normal. Thought content normal.          Assessment & Plan:  Arlys JohnBrian was seen today for follow-up.  Diagnoses and associated orders for this visit:  Unspecified essential hypertension - CMP  Morbid obesity - Ambulatory referral to diabetic education - CMP - Lipid Panel  Other and  unspecified hyperlipidemia - CMP - Lipid Panel  Erectile dysfunction  Other Orders - sildenafil (VIAGRA) 100 MG tablet; Take 0.5-1 tablets (50-100 mg total) by mouth daily as needed for erectile dysfunction.   Education: Pt given extensive education on diet: eliminate juices and sodas, stop eating fried foods and start adding vegetables and fruits to meals, try shopping on outside of grocery store isles. Pt also educated about importance of exercising daily, try enrolling in group exercise for motivation.   Follow up: 6 months for physical

## 2014-02-19 NOTE — Patient Instructions (Signed)
Exercise to Lose Weight Exercise and a healthy diet may help you lose weight. Your doctor may suggest specific exercises. EXERCISE IDEAS AND TIPS Choose low-cost things you enjoy doing, such as walking, bicycling, oFat and Cholesterol Control Diet Fat and cholesterol levels in your blood and organs are influenced by your diet. High levels of fat and cholesterol may lead to diseases of the heart, small and large blood vessels, gallbladder, liver, and pancreas. CONTROLLING FAT AND CHOLESTEROL WITH DIET Although exercise and lifestyle factors are important, your diet is key. That is because certain foods are known to raise cholesterol and others to lower it. The goal is to balance foods for their effect on cholesterol and more importantly, to replace saturated and trans fat with other types of fat, such as monounsaturated fat, polyunsaturated fat, and omega-3 fatty acids. On average, a person should consume no more than 15 to 17 g of saturated fat daily. Saturated and trans fats are considered "bad" fats, and they will raise LDL cholesterol. Saturated fats are primarily found in animal products such as meats, butter, and cream. However, that does not mean you need to give up all your favorite foods. Today, there are good tasting, low-fat, low-cholesterol substitutes for most of the things you like to eat. Choose low-fat or nonfat alternatives. Choose round or loin cuts of red meat. These types of cuts are lowest in fat and cholesterol. Chicken (without the skin), fish, veal, and ground [place] breast are great choices. Eliminate fatty meats, such as hot dogs and salami. Even shellfish have little or no saturated fat. Have a 3 oz (85 g) portion when you eat lean meat, poultry, or fish. Trans fats are also called "partially hydrogenated oils." They are oils that have been scientifically manipulated so that they are solid at room temperature resulting in a longer shelf life and improved taste and texture of foods  in which they are added. Trans fats are found in stick margarine, some tub margarines, cookies, crackers, and baked goods.  When baking and cooking, oils are a great substitute for butter. The monounsaturated oils are especially beneficial since it is believed they lower LDL and raise HDL. The oils you should avoid entirely are saturated tropical oils, such as coconut and palm.  Remember to eat a lot from food groups that are naturally free of saturated and trans fat, including fish, fruit, vegetables, beans, grains (barley, rice, couscous, bulgur wheat), and pasta (without cream sauces).  IDENTIFYING FOODS THAT LOWER FAT AND CHOLESTEROL  Soluble fiber may lower your cholesterol. This type of fiber is found in fruits such as apples, vegetables such as broccoli, potatoes, and carrots, legumes such as beans, peas, and lentils, and grains such as barley. Foods fortified with plant sterols (phytosterol) may also lower cholesterol. You should eat at least 2 g per day of these foods for a cholesterol lowering effect.  Read package labels to identify low-saturated fats, trans fat free, and low-fat foods at the supermarket. Select cheeses that have only 2 to 3 g saturated fat per ounce. Use a heart-healthy tub margarine that is free of trans fats or partially hydrogenated oil. When buying baked goods (cookies, crackers), avoid partially hydrogenated oils. Breads and muffins should be made from whole grains (whole-wheat or whole oat flour, instead of "flour" or "enriched flour"). Buy non-creamy canned soups with reduced salt and no added fats.  FOOD PREPARATION TECHNIQUES  Never deep-fry. If you must fry, either stir-fry, which uses very little fat, or use non-stick  cooking sprays. When possible, broil, bake, or roast meats, and steam vegetables. Instead of putting butter or margarine on vegetables, use lemon and herbs, applesauce, and cinnamon (for squash and sweet potatoes). Use nonfat yogurt, salsa, and low-fat  dressings for salads.  LOW-SATURATED FAT / LOW-FAT FOOD SUBSTITUTES Meats / Saturated Fat (g) Avoid: Steak, marbled (3 oz/85 g) / 11 g Choose: Steak, lean (3 oz/85 g) / 4 g Avoid: Hamburger (3 oz/85 g) / 7 g Choose: Hamburger, lean (3 oz/85 g) / 5 g Avoid: Ham (3 oz/85 g) / 6 g Choose: Ham, lean cut (3 oz/85 g) / 2.4 g Avoid: Chicken, with skin, dark meat (3 oz/85 g) / 4 g Choose: Chicken, skin removed, dark meat (3 oz/85 g) / 2 g Avoid: Chicken, with skin, light meat (3 oz/85 g) / 2.5 g Choose: Chicken, skin removed, light meat (3 oz/85 g) / 1 g Dairy / Saturated Fat (g) Avoid: Whole milk (1 cup) / 5 g Choose: Low-fat milk, 2% (1 cup) / 3 g Choose: Low-fat milk, 1% (1 cup) / 1.5 g Choose: Skim milk (1 cup) / 0.3 g Avoid: Hard cheese (1 oz/28 g) / 6 g Choose: Skim milk cheese (1 oz/28 g) / 2 to 3 g Avoid: Cottage cheese, 4% fat (1 cup) / 6.5 g Choose: Low-fat cottage cheese, 1% fat (1 cup) / 1.5 g Avoid: Ice cream (1 cup) / 9 g Choose: Sherbet (1 cup) / 2.5 g Choose: Nonfat frozen yogurt (1 cup) / 0.3 g Choose: Frozen fruit bar / trace Avoid: Whipped cream (1 tbs) / 3.5 g Choose: Nondairy whipped topping (1 tbs) / 1 g Condiments / Saturated Fat (g) Avoid: Mayonnaise (1 tbs) / 2 g Choose: Low-fat mayonnaise (1 tbs) / 1 g Avoid: Butter (1 tbs) / 7 g Choose: Extra light margarine (1 tbs) / 1 g Avoid: Coconut oil (1 tbs) / 11.8 g Choose: Olive oil (1 tbs) / 1.8 g Choose: Corn oil (1 tbs) / 1.7 g Choose: Safflower oil (1 tbs) / 1.2 g Choose: Sunflower oil (1 tbs) / 1.4 g Choose: Soybean oil (1 tbs) / 2.4 g Choose: Canola oil (1 tbs) / 1 g Document Released: 11/16/2005 Document Revised: 03/13/2013 Document Reviewed: 05/07/2011 [employer] Patient Information 2014 [place], [place].    Stretch before and after working out.  Work out until you breathe harder and your heart beats faster.  Drink extra water when you exercise.  Do not do so much that you hurt yourself, feel dizzy,  or get very short of breath. Exercises that burn about 150 calories:  Running 1  miles in 15 minutes.  Playing volleyball for 45 to 60 minutes.  Washing and waxing a car for 45 to 60 minutes.  Playing touch football for 45 minutes.  Walking 1  miles in 35 minutes.  Pushing a stroller 1  miles in 30 minutes.  Playing basketball for 30 minutes.  Raking leaves for 30 minutes.  Bicycling 5 miles in 30 minutes.  Walking 2 miles in 30 minutes.  Dancing for 30 minutes.  Shoveling snow for 15 minutes.  Swimming laps for 20 minutes.  Walking up stairs for 15 minutes.  Bicycling 4 miles in 15 minutes.  Gardening for 30 to 45 minutes.  Jumping rope for 15 minutes.  Washing windows or floors for 45 to 60 minutes. Document Released: 12/19/2010 Document Revised: 02/08/2012 Document Reviewed: 12/19/2010 [facility] Patient Information 2014 [place], [place].

## 2014-02-20 ENCOUNTER — Telehealth: Payer: Self-pay | Admitting: Family

## 2014-02-20 ENCOUNTER — Other Ambulatory Visit: Payer: Self-pay | Admitting: Family

## 2014-02-20 MED ORDER — OMEGA-3-ACID ETHYL ESTERS 1 G PO CAPS
2.0000 g | ORAL_CAPSULE | Freq: Two times a day (BID) | ORAL | Status: DC
Start: 2014-02-20 — End: 2014-09-26

## 2014-02-20 NOTE — Telephone Encounter (Signed)
Relevant patient education assigned to patient using Emmi. ° °

## 2014-03-07 ENCOUNTER — Encounter: Payer: Self-pay | Admitting: Family

## 2014-03-07 ENCOUNTER — Telehealth: Payer: Self-pay | Admitting: Family

## 2014-03-07 ENCOUNTER — Ambulatory Visit (INDEPENDENT_AMBULATORY_CARE_PROVIDER_SITE_OTHER): Payer: Commercial Managed Care - HMO | Admitting: Family

## 2014-03-07 VITALS — BP 142/92 | HR 110 | Wt 300.0 lb

## 2014-03-07 DIAGNOSIS — F4010 Social phobia, unspecified: Secondary | ICD-10-CM

## 2014-03-07 DIAGNOSIS — I10 Essential (primary) hypertension: Secondary | ICD-10-CM

## 2014-03-07 DIAGNOSIS — E669 Obesity, unspecified: Secondary | ICD-10-CM

## 2014-03-07 MED ORDER — BUPROPION HCL ER (XL) 150 MG PO TB24: 150.0000 mg | ORAL_TABLET | Freq: Every day | ORAL | Status: DC

## 2014-03-07 NOTE — Patient Instructions (Signed)
Social Anxiety Disorder Social anxiety disorder, previously called social phobia, is a mental disorder. People with social anxiety disorder frequently feel nervous, afraid, or embarrassed when around other people in social situations. They constantly worry that other people are judging or criticizing them for how they look, what they say, or how they act. They may worry that other people might reject them because of their appearance or behavior. Social anxiety disorder is more than just occasional shyness or self-consciousness. It can cause severe emotional distress. It can interfere with daily life activities. Social anxiety disorder also may lead to excessive alcohol or drug use and even suicide.  Social anxiety disorder is actually one of the most common mental disorders. It can develop at any time but usually starts in the teenage years. Women are more commonly affected than men. Social anxiety disorder is also more common in people who have family members with anxiety disorders. It also is more common in people who have physical deformities or conditions with characteristics that are obvious to others, such as stuttered speech or movement abnormalities (Parkinson disease).  SYMPTOMS  In addition to feeling anxious or fearful in social situations, people with social anxiety disorder frequently have physical symptoms. Examples include:  Red face (blushing).  Racing heart.  Sweating.  Shaky hands or voice.  Confusion.  Lightheadedness.  Upset stomach and diarrhea. DIAGNOSIS  Social anxiety disorder is diagnosed through an assessment by your caregiver. Your caregiver will ask you questions about your mood, thoughts, and reactions in social situations. Your caregiver may ask you about your medical history and use of alcohol or drugs, including prescription medications. Certain medical conditions and the use of certain substances, including caffeine, can cause symptoms similar to social anxiety  disorder. Your caregiver may refer you to a mental health specialist for further evaluation or treatment. The criteria for diagnosis of social anxiety disorder are:  Marked fear or anxiety in one or more social situations in which you may be closely watched or studied by others. Examples of such situations include:  Interacting socially (having a conversation with others, going to a party, or meeting strangers).  Being observed (eating or drinking in public or being called on in class).  Performing in front of others (giving a speech).  The social situations of concern almost always cause fear or anxiety, not just occasionally.  People with social anxiety disorder fear that they will be viewed negatively in a way that will be embarrassing, will lead to rejection, or will offend others. This fear is out of proportion to the actual threat posed by the social situation.  Often the triggering social situations are avoided, or they are endured with intense fear or anxiety. The fear, anxiety, or avoidance is persistent and lasts for 6 months or longer.  The anxiety causes difficulty functioning in at least some parts of your daily life. TREATMENT  Several types of treatment are available for social anxiety disorder. These treatments are often used in combination and include:   Talk therapy. Group talk therapy allows you to see that you are not alone with these problems. Individual talk therapy helps you address your specific anxiety issues with a caring professional. The most effective forms of talk therapy for social anxiety disorder are cognitive behavioral therapy and exposure therapy. Cognitive behavioral therapy helps you to identify and change negative thoughts and beliefs that are at the root of the disorder. Exposure therapy allows you to gradually face the situations that you fear most.  Relaxation   and coping techniques. These include deep breathing, self-talk, meditation, visual imagery,  and yoga. Relaxation techniques help to keep you calm in social situations.  Social skills training.Social skills can be learned on your own or with the help of a talk therapist. They can help you feel more confident and comfortable in social situations.  Medication. For anxiety limited to performance situations (performance anxiety), medication called beta blockers can help by reducing or preventing the physical symptoms of social anxiety disorder. For more persistent and generalized social anxiety, antidepressant medication may be prescribed to help control symptoms. In severe cases of social anxiety disorder, strong antianxiety medication, called benzodiazepines, may be prescribed on a limited basis and for a short time. Document Released: 10/15/2005 Document Revised: 03/13/2013 Document Reviewed: 02/14/2013 [employer] Patient Information 2014 [employer].  

## 2014-03-07 NOTE — Progress Notes (Signed)
   Subjective:    Patient ID: Logan Sanchez, male    DOB: January 01, 1971, 43 y.o.   MRN: [id number]  HPI 43 year old [place], smoker, then today with complaints of social anxiety. Reports over the last year he found himself staying in the house because he gets anxious being around a lot of people, particularly men. Has found himself not wanting to eat and have company at home anymore. Reports drinking alcohol every one to 2 days and drinking about 1 gallon of liquor weekly. Also reports taking [employer]. John's wort to help him calm down. Has a maternal grandmother as well his mother that are currently being treated for anxiety. Does no routine exercise. Denies any helplessness hopelessness, no thoughts of death or dying.   Review of Systems  Constitutional: Negative.   Respiratory: Negative.   Cardiovascular: Negative.   Gastrointestinal: Negative.   Endocrine: Negative.   Genitourinary: Negative.   Musculoskeletal: Negative.   Skin: Negative.   Neurological: Negative.   Hematological: Negative.   Psychiatric/Behavioral: The patient is nervous/anxious.    Past Medical History  Diagnosis Date  . Brain injury   . Hypertension     History   Social History  . Marital Status: Single    Spouse Name: N/A    Number of Children: N/A  . Years of Education: N/A   Occupational History  . Not on file.   Social History Main Topics  . Smoking status: Current Every Day Smoker  . Smokeless tobacco: Not on file  . Alcohol Use: Yes  . Drug Use:   . Sexual Activity:    Other Topics Concern  . Not on file   Social History Narrative  . No narrative on file    History reviewed. No pertinent past surgical history.  No family history on file.  No Known Allergies  Current Outpatient Prescriptions on File Prior to Visit  Medication Sig Dispense Refill  . fish oil-omega-3 fatty acids 1000 MG capsule Take 1 g by mouth daily.      . Ginkgo Biloba (GNP GINGKO BILOBA EXTRACT PO)  Take 1 capsule by mouth daily.      Marland Kitchen. losartan-hydrochlorothiazide (HYZAAR) 100-25 MG per tablet TAKE 1 TABLET BY MOUTH DAILY.  30 tablet  3  . Misc Natural Products (GINSENG COMPLEX PO) Take 1 tablet by mouth daily.      Marland Kitchen. omega-3 acid ethyl esters (LOVAZA) 1 G capsule Take 2 capsules (2 g total) by mouth 2 (two) times daily.  120 capsule  3  . sildenafil (VIAGRA) 100 MG tablet Take 0.5-1 tablets (50-100 mg total) by mouth daily as needed for erectile dysfunction.  3 tablet  0-   No current facility-administered medications on file prior to visit.    BP 142/92  Pulse 110  Wt 300 lb (136.079 kg)  SpO2 95%chart    Objective:   Physical Exam  Constitutional: He is oriented to person, place, and time. He appears well-developed and well-nourished.  Neck: Normal range of motion. Neck supple.  Cardiovascular: Normal rate, regular rhythm and normal heart sounds.   Pulmonary/Chest: Effort normal and breath sounds normal.  Musculoskeletal: Normal range of motion.  Neurological: He is alert and oriented to person, place, and time.  Skin: Skin is warm and dry.  Psychiatric: He has a normal mood and affect.          Assessment & Plan:    Recommend psychotherapy. Start Wellbutrin. Recheck in 4 weeks. Exercise daily. Decrease alcohol consumption

## 2014-03-21 ENCOUNTER — Ambulatory Visit (INDEPENDENT_AMBULATORY_CARE_PROVIDER_SITE_OTHER): Payer: Medicare HMO | Admitting: Licensed Clinical Social Worker

## 2014-03-21 DIAGNOSIS — F331 Major depressive disorder, recurrent, moderate: Secondary | ICD-10-CM

## 2014-04-04 ENCOUNTER — Ambulatory Visit (INDEPENDENT_AMBULATORY_CARE_PROVIDER_SITE_OTHER): Payer: Commercial Managed Care - HMO | Admitting: Family

## 2014-04-04 ENCOUNTER — Encounter: Payer: Self-pay | Admitting: Family

## 2014-04-04 VITALS — BP 130/90 | HR 88 | Temp 99.3°F | Wt 301.0 lb

## 2014-04-04 DIAGNOSIS — F32A Depression, unspecified: Secondary | ICD-10-CM

## 2014-04-04 DIAGNOSIS — F329 Major depressive disorder, single episode, unspecified: Secondary | ICD-10-CM

## 2014-04-04 DIAGNOSIS — F3289 Other specified depressive episodes: Secondary | ICD-10-CM

## 2014-04-04 DIAGNOSIS — F411 Generalized anxiety disorder: Secondary | ICD-10-CM

## 2014-04-04 MED ORDER — BUPROPION HCL ER (XL) 300 MG PO TB24: 300.0000 mg | ORAL_TABLET | Freq: Every day | ORAL | Status: DC

## 2014-04-04 NOTE — Progress Notes (Signed)
Pre visit review using our clinic review tool, if applicable. No additional management support is needed unless otherwise documented below in the visit note. 

## 2014-04-04 NOTE — Progress Notes (Signed)
Subjective:    Patient ID: Logan Sanchez, male    DOB: May 19, 1971, 43 y.o.   MRN: [id number]  HPI 43 year old [place] male, presents today for recheck of anxiety and depression. He has been seeing Judithe ModestSusan Bond for therapy who suggested that he be on an anti-anxiety antidepressant medication for which he is tolerating well. He is taking Wellbutrin 150 mg once daily. He is able to get out and exercise more. However, still continues to battle with anxiety. Also continues to drink heavily. He feels that his mood is gradually improving. Denies any feelings of helplessness, hopelessness, but the death or dying.   Review of Systems  Constitutional: Negative.   HENT: Negative.   Respiratory: Negative.   Cardiovascular: Negative.   Gastrointestinal: Negative.   Endocrine: Negative.   Musculoskeletal: Negative.   Neurological: Negative.   Hematological: Negative.   Psychiatric/Behavioral: Positive for sleep disturbance. Negative for suicidal ideas and hallucinations. The patient is nervous/anxious. The patient is not hyperactive.    Past Medical History  Diagnosis Date  . Brain injury   . Hypertension     History   Social History  . Marital Status: Single    Spouse Name: N/A    Number of Children: N/A  . Years of Education: N/A   Occupational History  . Not on file.   Social History Main Topics  . Smoking status: Current Every Day Smoker  . Smokeless tobacco: Not on file  . Alcohol Use: Yes  . Drug Use:   . Sexual Activity:    Other Topics Concern  . Not on file   Social History Narrative  . No narrative on file    History reviewed. No pertinent past surgical history.  No family history on file.  No Known Allergies  Current Outpatient Prescriptions on File Prior to Visit  Medication Sig Dispense Refill  . fish oil-omega-3 fatty acids 1000 MG capsule Take 1 g by mouth daily.      . Ginkgo Biloba (GNP GINGKO BILOBA EXTRACT PO) Take 1 capsule by mouth daily.       Marland Kitchen. losartan-hydrochlorothiazide (HYZAAR) 100-25 MG per tablet TAKE 1 TABLET BY MOUTH DAILY.  30 tablet  3  . Misc Natural Products (GINSENG COMPLEX PO) Take 1 tablet by mouth daily.      Marland Kitchen. omega-3 acid ethyl esters (LOVAZA) 1 G capsule Take 2 capsules (2 g total) by mouth 2 (two) times daily.  120 capsule  3  . sildenafil (VIAGRA) 100 MG tablet Take 0.5-1 tablets (50-100 mg total) by mouth daily as needed for erectile dysfunction.  3 tablet  0-   No current facility-administered medications on file prior to visit.    BP 130/90  Pulse 88  Temp(Src) 99.3 F (37.4 C) (Oral)  Wt 301 lb (136.533 kg)chart    Objective:   Physical Exam  Constitutional: He is oriented to person, place, and time. He appears well-developed and well-nourished.  Neck: Normal range of motion. Neck supple.  Cardiovascular: Normal rate, regular rhythm and normal heart sounds.   Pulmonary/Chest: Effort normal and breath sounds normal.  Abdominal: Soft. Bowel sounds are normal.  Musculoskeletal: Normal range of motion.  Neurological: He is alert and oriented to person, place, and time.  Skin: Skin is warm and dry.  Psychiatric: He has a normal mood and affect.          Assessment & Plan:  Arlys JohnBrian was seen today for follow-up.  Diagnoses and associated orders for this visit:  Depression  Generalized anxiety disorder  Other Orders - buPROPion (WELLBUTRIN XL) 300 MG 24 hr tablet; Take 1 tablet (300 mg total) by mouth daily.   Call the office with any questions or concerns. Recheck in 1 months and sooner as needed.

## 2014-04-04 NOTE — Patient Instructions (Signed)

## 2014-04-05 ENCOUNTER — Ambulatory Visit (INDEPENDENT_AMBULATORY_CARE_PROVIDER_SITE_OTHER): Payer: Medicare HMO | Admitting: Licensed Clinical Social Worker

## 2014-04-05 DIAGNOSIS — F331 Major depressive disorder, recurrent, moderate: Secondary | ICD-10-CM

## 2014-04-19 ENCOUNTER — Ambulatory Visit: Payer: Commercial Managed Care - HMO | Admitting: Licensed Clinical Social Worker

## 2014-04-26 ENCOUNTER — Ambulatory Visit (INDEPENDENT_AMBULATORY_CARE_PROVIDER_SITE_OTHER): Payer: Medicare HMO | Admitting: Licensed Clinical Social Worker

## 2014-04-26 DIAGNOSIS — F331 Major depressive disorder, recurrent, moderate: Secondary | ICD-10-CM

## 2014-05-07 ENCOUNTER — Telehealth: Payer: Self-pay | Admitting: Family

## 2014-05-07 ENCOUNTER — Encounter: Payer: Self-pay | Admitting: Family

## 2014-05-07 ENCOUNTER — Ambulatory Visit (INDEPENDENT_AMBULATORY_CARE_PROVIDER_SITE_OTHER): Payer: Commercial Managed Care - HMO | Admitting: Family

## 2014-05-07 VITALS — BP 160/100 | HR 95 | Temp 99.2°F | Wt 306.0 lb

## 2014-05-07 DIAGNOSIS — F3289 Other specified depressive episodes: Secondary | ICD-10-CM

## 2014-05-07 DIAGNOSIS — I10 Essential (primary) hypertension: Secondary | ICD-10-CM

## 2014-05-07 DIAGNOSIS — F329 Major depressive disorder, single episode, unspecified: Secondary | ICD-10-CM

## 2014-05-07 DIAGNOSIS — F32A Depression, unspecified: Secondary | ICD-10-CM

## 2014-05-07 NOTE — Progress Notes (Signed)
Pre visit review using our clinic review tool, if applicable. No additional management support is needed unless otherwise documented below in the visit note. 
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
## 2014-05-07 NOTE — Telephone Encounter (Signed)
Relevant patient education assigned to patient using Emmi. ° °

## 2014-05-07 NOTE — Patient Instructions (Signed)

## 2014-05-09 ENCOUNTER — Ambulatory Visit (INDEPENDENT_AMBULATORY_CARE_PROVIDER_SITE_OTHER): Payer: Commercial Managed Care - HMO | Admitting: Licensed Clinical Social Worker

## 2014-05-09 DIAGNOSIS — F331 Major depressive disorder, recurrent, moderate: Secondary | ICD-10-CM

## 2014-05-23 ENCOUNTER — Ambulatory Visit (INDEPENDENT_AMBULATORY_CARE_PROVIDER_SITE_OTHER): Payer: Medicare HMO | Admitting: Licensed Clinical Social Worker

## 2014-05-23 DIAGNOSIS — F331 Major depressive disorder, recurrent, moderate: Secondary | ICD-10-CM

## 2014-06-06 ENCOUNTER — Ambulatory Visit (INDEPENDENT_AMBULATORY_CARE_PROVIDER_SITE_OTHER): Payer: Medicare HMO | Admitting: Licensed Clinical Social Worker

## 2014-06-06 DIAGNOSIS — F331 Major depressive disorder, recurrent, moderate: Secondary | ICD-10-CM

## 2014-06-18 ENCOUNTER — Encounter: Payer: Self-pay | Admitting: Family

## 2014-06-18 ENCOUNTER — Telehealth: Payer: Self-pay | Admitting: Family

## 2014-06-18 ENCOUNTER — Ambulatory Visit (INDEPENDENT_AMBULATORY_CARE_PROVIDER_SITE_OTHER): Payer: Commercial Managed Care - HMO | Admitting: Family

## 2014-06-18 VITALS — BP 130/82 | HR 105 | Wt 298.0 lb

## 2014-06-18 DIAGNOSIS — F329 Major depressive disorder, single episode, unspecified: Secondary | ICD-10-CM

## 2014-06-18 DIAGNOSIS — F3289 Other specified depressive episodes: Secondary | ICD-10-CM

## 2014-06-18 DIAGNOSIS — F32A Depression, unspecified: Secondary | ICD-10-CM

## 2014-06-18 DIAGNOSIS — F4010 Social phobia, unspecified: Secondary | ICD-10-CM

## 2014-06-18 DIAGNOSIS — F411 Generalized anxiety disorder: Secondary | ICD-10-CM

## 2014-06-18 NOTE — Progress Notes (Signed)
Pre visit review using our clinic review tool, if applicable. No additional management support is needed unless otherwise documented below in the visit note. 

## 2014-06-18 NOTE — Progress Notes (Signed)
Subjective:    Patient ID: Logan Sanchez, male    DOB: 09/07/1971, 43 y.o.   MRN: [id number]  HPI 43 y.o. African American male presents today for follow up for depression, obesity, anxiety and hypertension. Pt was started on 300 of Bupropion XR and encouraged to start exercising more often and modify diet. Pt reports that his mood is much better, he feels like he is having more energy and his anxiety has decreased. Pt also reports that he has joined the [facility] and is trying to go more often. Pt has decreased his eating out and eating unhealthy foods. Pt denies fever, chills, fatigue, SOB and change in appetite.     Review of Systems  Constitutional: Negative.   Respiratory: Negative.   Cardiovascular: Negative.   Gastrointestinal: Negative.   Endocrine: Negative.   Musculoskeletal: Negative.   Skin: Negative.   Neurological: Negative.   Psychiatric/Behavioral: Negative.    Past Medical History  Diagnosis Date  . Brain injury   . Hypertension     History   Social History  . Marital Status: Single    Spouse Name: N/A    Number of Children: N/A  . Years of Education: N/A   Occupational History  . Not on file.   Social History Main Topics  . Smoking status: Current Every Day Smoker  . Smokeless tobacco: Not on file  . Alcohol Use: Yes  . Drug Use:   . Sexual Activity:    Other Topics Concern  . Not on file   Social History Narrative  . No narrative on file    No past surgical history on file.  No family history on file.  No Known Allergies  Current Outpatient Prescriptions on File Prior to Visit  Medication Sig Dispense Refill  . buPROPion (WELLBUTRIN XL) 300 MG 24 hr tablet Take 1 tablet (300 mg total) by mouth daily.  30 tablet  3  . fish oil-omega-3 fatty acids 1000 MG capsule Take 1 g by mouth daily.      . Ginkgo Biloba (GNP GINGKO BILOBA EXTRACT PO) Take 1 capsule by mouth daily.      Marland Kitchen losartan-hydrochlorothiazide (HYZAAR) 100-25 MG per tablet TAKE 1  TABLET BY MOUTH DAILY.  30 tablet  3  . Misc Natural Products (GINSENG COMPLEX PO) Take 1 tablet by mouth daily.      Marland Kitchen omega-3 acid ethyl esters (LOVAZA) 1 G capsule Take 2 capsules (2 g total) by mouth 2 (two) times daily.  120 capsule  3  . sildenafil (VIAGRA) 100 MG tablet Take 0.5-1 tablets (50-100 mg total) by mouth daily as needed for erectile dysfunction.  3 tablet  0-   No current facility-administered medications on file prior to visit.    BP 130/82  Pulse 105  Wt 298 lb (135.172 kg)chart    Objective:   Physical Exam  Constitutional: He is oriented to person, place, and time. He appears well-developed and well-nourished. He is active.  Cardiovascular: Normal rate, regular rhythm, normal heart sounds and normal pulses.   Pulmonary/Chest: Effort normal and breath sounds normal.  Abdominal: Soft. Normal appearance and bowel sounds are normal.  Neurological: He is alert and oriented to person, place, and time.  Skin: Skin is warm, dry and intact.  Psychiatric: His speech is normal and behavior is normal. Thought content normal. His mood appears not anxious. He does not exhibit a depressed mood. He expresses no homicidal and no suicidal ideation.  Assessment & Plan:  Logan Sanchez was seen today for follow-up.  Diagnoses and associated orders for this visit:  Depression  Generalized anxiety disorder  Morbid obesity  Social phobia   Education: Pt encouraged to increase exercise to AT LEAST 30 minutes, 3 times per week to start. He was given information about exercise including walking, swimming and elliptical. Healthy diet also encouraged.   Follow up: Physical in 2 months.

## 2014-06-18 NOTE — Telephone Encounter (Signed)
Relevant patient education assigned to patient using Emmi. ° °

## 2014-06-18 NOTE — Patient Instructions (Signed)
Please be sure to exercise AT LEAST 30 minutes per day, 5 times per week. Doing exercise like [facility], elliptical and swimming are great exercises to increase your heart rate and help with weight loss.   Exercise to Lose Weight Exercise and a healthy diet may help you lose weight. Your doctor may suggest specific exercises. EXERCISE IDEAS AND TIPS  Choose low-cost things you enjoy doing, such as walking, bicycling, or exercising to workout videos.  Take stairs instead of the elevator.  Walk during your lunch break.  Park your car further away from work or school.  Go to a gym or an exercise class.  Start with 5 to 10 minutes of exercise each day. Build up to 30 minutes of exercise 4 to 6 days a week.  Wear shoes with good support and comfortable clothes.  Stretch before and after working out.  Work out until you breathe harder and your heart beats faster.  Drink extra water when you exercise.  Do not do so much that you hurt yourself, feel dizzy, or get very short of breath. Exercises that burn about 150 calories:  Running 1  miles in 15 minutes.  Playing volleyball for 45 to 60 minutes.  Washing and waxing a car for 45 to 60 minutes.  Playing touch football for 45 minutes.  Walking 1  miles in 35 minutes.  Pushing a stroller 1  miles in 30 minutes.  Playing basketball for 30 minutes.  Raking leaves for 30 minutes.  Bicycling 5 miles in 30 minutes.  Walking 2 miles in 30 minutes.  Dancing for 30 minutes.  Shoveling snow for 15 minutes.  Swimming laps for 20 minutes.  Walking up stairs for 15 minutes.  Bicycling 4 miles in 15 minutes.  Gardening for 30 to 45 minutes.  Jumping rope for 15 minutes.  Washing windows or floors for 45 to 60 minutes. Document Released: 12/19/2010 Document Revised: 02/08/2012 Document Reviewed: 12/19/2010 [facility] Patient Information 2015 [place]. This information is not intended to replace advice  given to you by your health care provider. Make sure you discuss any questions you have with your health care provider.

## 2014-06-20 ENCOUNTER — Ambulatory Visit (INDEPENDENT_AMBULATORY_CARE_PROVIDER_SITE_OTHER): Payer: Medicare HMO | Admitting: Licensed Clinical Social Worker

## 2014-06-20 DIAGNOSIS — F331 Major depressive disorder, recurrent, moderate: Secondary | ICD-10-CM

## 2014-07-11 ENCOUNTER — Ambulatory Visit (INDEPENDENT_AMBULATORY_CARE_PROVIDER_SITE_OTHER): Payer: Medicare HMO | Admitting: Licensed Clinical Social Worker

## 2014-07-11 DIAGNOSIS — F331 Major depressive disorder, recurrent, moderate: Secondary | ICD-10-CM

## 2014-08-02 ENCOUNTER — Ambulatory Visit (INDEPENDENT_AMBULATORY_CARE_PROVIDER_SITE_OTHER): Payer: Commercial Managed Care - HMO | Admitting: Licensed Clinical Social Worker

## 2014-08-02 DIAGNOSIS — F331 Major depressive disorder, recurrent, moderate: Secondary | ICD-10-CM

## 2014-08-06 ENCOUNTER — Other Ambulatory Visit: Payer: Self-pay | Admitting: Family

## 2014-08-13 ENCOUNTER — Encounter: Payer: Self-pay | Admitting: Family

## 2014-08-13 ENCOUNTER — Ambulatory Visit (INDEPENDENT_AMBULATORY_CARE_PROVIDER_SITE_OTHER): Payer: Commercial Managed Care - HMO | Admitting: Family

## 2014-08-13 VITALS — BP 120/90 | HR 100 | Ht 67.0 in | Wt 301.5 lb

## 2014-08-13 DIAGNOSIS — F3289 Other specified depressive episodes: Secondary | ICD-10-CM

## 2014-08-13 DIAGNOSIS — Z23 Encounter for immunization: Secondary | ICD-10-CM

## 2014-08-13 DIAGNOSIS — Z0000 Encounter for general adult medical examination without abnormal findings: Secondary | ICD-10-CM

## 2014-08-13 DIAGNOSIS — F329 Major depressive disorder, single episode, unspecified: Secondary | ICD-10-CM

## 2014-08-13 DIAGNOSIS — I10 Essential (primary) hypertension: Secondary | ICD-10-CM

## 2014-08-13 DIAGNOSIS — E7800 Pure hypercholesterolemia, unspecified: Secondary | ICD-10-CM

## 2014-08-13 DIAGNOSIS — F32A Depression, unspecified: Secondary | ICD-10-CM

## 2014-08-13 LAB — CBC WITH DIFFERENTIAL/PLATELET
Basophils Absolute: 0.1 10*3/uL (ref 0.0–0.1)
Basophils Relative: 1.4 % (ref 0.0–3.0)
EOS PCT: 4 % (ref 0.0–5.0)
Eosinophils Absolute: 0.2 10*3/uL (ref 0.0–0.7)
HEMATOCRIT: 44.5 % (ref 39.0–52.0)
Hemoglobin: 15.3 g/dL (ref 13.0–17.0)
Lymphocytes Relative: 41.6 % (ref 12.0–46.0)
Lymphs Abs: 2.5 10*3/uL (ref 0.7–4.0)
MCHC: 34.5 g/dL (ref 30.0–36.0)
MCV: 97.3 fl (ref 78.0–100.0)
Monocytes Absolute: 0.4 10*3/uL (ref 0.1–1.0)
Monocytes Relative: 6.2 % (ref 3.0–12.0)
NEUTROS ABS: 2.9 10*3/uL (ref 1.4–7.7)
Neutrophils Relative %: 46.8 % (ref 43.0–77.0)
Platelets: 279 10*3/uL (ref 150.0–400.0)
RBC: 4.57 Mil/uL (ref 4.22–5.81)
RDW: 14.2 % (ref 11.5–15.5)
WBC: 6.1 10*3/uL (ref 4.0–10.5)

## 2014-08-13 LAB — POCT URINALYSIS DIPSTICK
BILIRUBIN UA: NEGATIVE
Blood, UA: NEGATIVE
Glucose, UA: NEGATIVE
KETONES UA: NEGATIVE
LEUKOCYTES UA: NEGATIVE
Nitrite, UA: NEGATIVE
PH UA: 6.5
Protein, UA: NEGATIVE
SPEC GRAV UA: 1.01
Urobilinogen, UA: 0.2

## 2014-08-13 LAB — TSH: TSH: 0.79 u[IU]/mL (ref 0.35–4.50)

## 2014-08-13 LAB — COMPREHENSIVE METABOLIC PANEL
ALT: 80 U/L — ABNORMAL HIGH (ref 0–53)
AST: 66 U/L — AB (ref 0–37)
Albumin: 3.6 g/dL (ref 3.5–5.2)
Alkaline Phosphatase: 82 U/L (ref 39–117)
BILIRUBIN TOTAL: 1 mg/dL (ref 0.2–1.2)
BUN: 11 mg/dL (ref 6–23)
CO2: 25 mEq/L (ref 19–32)
Calcium: 8.5 mg/dL (ref 8.4–10.5)
Chloride: 99 mEq/L (ref 96–112)
Creatinine, Ser: 1.2 mg/dL (ref 0.4–1.5)
GFR: 82.89 mL/min (ref >60.00)
GLUCOSE: 94 mg/dL (ref 70–99)
Potassium: 3 mEq/L — ABNORMAL LOW (ref 3.5–5.1)
SODIUM: 136 meq/L (ref 135–145)
Total Protein: 7.1 g/dL (ref 6.0–8.3)

## 2014-08-13 LAB — LIPID PANEL
Cholesterol: 220 mg/dL — ABNORMAL HIGH (ref 0–200)
HDL: 29.1 mg/dL — AB (ref >39.00)
NonHDL: 190.9
Total CHOL/HDL Ratio: 8
Triglycerides: 478 mg/dL — ABNORMAL HIGH (ref 0.0–149.0)
VLDL: 95.6 mg/dL — ABNORMAL HIGH (ref 0.0–40.0)

## 2014-08-13 LAB — LDL CHOLESTEROL, DIRECT: Direct LDL: 129.3 mg/dL

## 2014-08-13 NOTE — Patient Instructions (Signed)
Alcohol Use Disorder Alcohol use disorder is a mental disorder. It is not a one-time incident of heavy drinking. Alcohol use disorder is the excessive and uncontrollable use of alcohol over time that leads to problems with functioning in one or more areas of daily living. People with this disorder risk harming themselves and others when they drink to excess. Alcohol use disorder also can cause other mental disorders, such as mood and anxiety disorders, and serious physical problems. People with alcohol use disorder often misuse other drugs.  Alcohol use disorder is common and widespread. Some people with this disorder drink alcohol to cope with or escape from negative life events. Others drink to relieve chronic pain or symptoms of mental illness. People with a family history of alcohol use disorder are at higher risk of losing control and using alcohol to excess.  SYMPTOMS  Signs and symptoms of alcohol use disorder may include the following:   Consumption ofalcohol inlarger amounts or over a longer period of time than intended.  Multiple unsuccessful attempts to cutdown or control alcohol use.   A great deal of time spent obtaining alcohol, using alcohol, or recovering from the effects of alcohol (hangover).  A strong desire or urge to use alcohol (cravings).   Continued use of alcohol despite problems at work, school, or home because of alcohol use.   Continued use of alcohol despite problems in relationships because of alcohol use.  Continued use of alcohol in situations when it is physically hazardous, such as driving a car.  Continued use of alcohol despite awareness of a physical or psychological problem that is likely related to alcohol use. Physical problems related to alcohol use can involve the brain, heart, liver, stomach, and intestines. Psychological problems related to alcohol use include intoxication, depression, anxiety, psychosis, delirium, and dementia.   The need for  increased amounts of alcohol to achieve the same desired effect, or a decreased effect from the consumption of the same amount of alcohol (tolerance).  Withdrawal symptoms upon reducing or stopping alcohol use, or alcohol use to reduce or avoid withdrawal symptoms. Withdrawal symptoms include:  Racing heart.  Hand tremor.  Difficulty sleeping.  Nausea.  Vomiting.  Hallucinations.  Restlessness.  Seizures. DIAGNOSIS Alcohol use disorder is diagnosed through an assessment by your health care provider. Your health care provider may start by asking three or four questions to screen for excessive or problematic alcohol use. To confirm a diagnosis of alcohol use disorder, at least two symptoms must be present within a 12-month period. The severity of alcohol use disorder depends on the number of symptoms:  Mild--two or three.  Moderate--four or five.  Severe--six or more. Your health care provider may perform a physical exam or use results from lab tests to see if you have physical problems resulting from alcohol use. Your health care provider may refer you to a mental health professional for evaluation. TREATMENT  Some people with alcohol use disorder are able to reduce their alcohol use to low-risk levels. Some people with alcohol use disorder need to quit drinking alcohol. When necessary, mental health professionals with specialized training in substance use treatment can help. Your health care provider can help you decide how severe your alcohol use disorder is and what type of treatment you need. The following forms of treatment are available:   Detoxification. Detoxification involves the use of prescription medicines to prevent alcohol withdrawal symptoms in the first week after quitting. This is important for people with a history of symptoms   of withdrawal and for heavy drinkers who are likely to have withdrawal symptoms. Alcohol withdrawal can be dangerous and, in severe cases, cause  death. Detoxification is usually provided in a hospital or in-patient substance use treatment facility.  Counseling or talk therapy. Talk therapy is provided by substance use treatment counselors. It addresses the reasons people use alcohol and ways to keep them from drinking again. The goals of talk therapy are to help people with alcohol use disorder find healthy activities and ways to cope with life stress, to identify and avoid triggers for alcohol use, and to handle cravings, which can cause relapse.  Medicines.Different medicines can help treat alcohol use disorder through the following actions:  Decrease alcohol cravings.  Decrease the positive reward response felt from alcohol use.  Produce an uncomfortable physical reaction when alcohol is used (aversion therapy).  Support groups. Support groups are run by people who have quit drinking. They provide emotional support, advice, and guidance. These forms of treatment are often combined. Some people with alcohol use disorder benefit from intensive combination treatment provided by specialized substance use treatment centers. Both inpatient and outpatient treatment programs are available. Document Released: 12/24/2004 Document Revised: 04/02/2014 Document Reviewed: 02/23/2013 [employer] Patient Information 2015 [employer]. This information is not intended to replace advice given to you by your health care provider. Make sure you discuss any questions you have with your health care provider.  

## 2014-08-13 NOTE — Progress Notes (Signed)
   Subjective:    Patient ID: Logan Sanchez, male    DOB: 01-Aug-1971, 43 y.o.   MRN: [id number]  HPI  43 year old African American male, recently stopped smoking, with a history of obesity, hypertension, hypercholesterolemia, alcohol abuse, depression is in today for complete physical exam. Not currently smoking but drinking a 40 ounce beer every other day and a fifth of liquor once a week. Weight is up 3 pounds. His routinely exercise. Is attempting to cut back on his alcohol intake.   Review of Systems  Constitutional: Negative.   HENT: Negative.   Eyes: Negative.   Respiratory: Negative.   Cardiovascular: Negative.   Gastrointestinal: Negative.   Endocrine: Negative.   Genitourinary: Negative.   Musculoskeletal: Negative.   Skin: Negative.   Allergic/Immunologic: Negative.   Neurological: Negative.   Hematological: Negative.   Psychiatric/Behavioral: Negative.        Objective:   Physical Exam  Constitutional: He is oriented to person, place, and time. He appears well-developed and well-nourished.  HENT:  Head: Normocephalic and atraumatic.  Right Ear: External ear normal.  Left Ear: External ear normal.  Nose: Nose normal.  Mouth/Throat: Oropharynx is clear and moist.  Eyes: Conjunctivae and EOM are normal. Pupils are equal, round, and reactive to light.  Neck: Normal range of motion. Neck supple. No thyromegaly present.  Cardiovascular: Normal rate, regular rhythm and normal heart sounds.   Pulmonary/Chest: Effort normal and breath sounds normal.  Abdominal: Soft. Bowel sounds are normal.  Genitourinary: Rectum normal, prostate normal and penis normal. Guaiac negative stool. No penile tenderness.  Musculoskeletal: Normal range of motion.  Neurological: He is alert and oriented to person, place, and time. He has normal reflexes. No cranial nerve deficit.  Skin: Skin is warm and dry.  Psychiatric: He has a normal mood and affect.          Assessment & Plan:    Paulette was seen today for annual exam.  Diagnoses and associated orders for this visit:  Preventative health care - CBC with Differential - TSH - POC Urinalysis Dipstick  Depression  Unspecified essential hypertension - CMP - EKG 12-Lead  Pure hypercholesterolemia - Lipid Panel - EKG 12-Lead  Morbid obesity - TSH  Need for prophylactic vaccination and inoculation against influenza - Flu Vaccine QUAD 36+ mos PF IM (Fluarix Quad PF)    call the office with any questions or concerns. Recheck pending labs, in 4 months, and sooner if needed. Encouraged exercise 3-4 times per week. Decrease alcohol consumption. Continue therapy.

## 2014-08-13 NOTE — Progress Notes (Signed)
Pre visit review using our clinic review tool, if applicable. No additional management support is needed unless otherwise documented below in the visit note. 

## 2014-08-14 ENCOUNTER — Other Ambulatory Visit: Payer: Self-pay | Admitting: Family

## 2014-08-14 MED ORDER — POTASSIUM CHLORIDE ER 10 MEQ PO TBCR: 10.0000 meq | EXTENDED_RELEASE_TABLET | Freq: Every day | ORAL | Status: DC

## 2014-08-23 ENCOUNTER — Ambulatory Visit (INDEPENDENT_AMBULATORY_CARE_PROVIDER_SITE_OTHER): Payer: Medicare HMO | Admitting: Licensed Clinical Social Worker

## 2014-08-23 DIAGNOSIS — F331 Major depressive disorder, recurrent, moderate: Secondary | ICD-10-CM

## 2014-09-12 ENCOUNTER — Ambulatory Visit (INDEPENDENT_AMBULATORY_CARE_PROVIDER_SITE_OTHER): Payer: Medicare HMO | Admitting: Licensed Clinical Social Worker

## 2014-09-12 DIAGNOSIS — F330 Major depressive disorder, recurrent, mild: Secondary | ICD-10-CM

## 2014-09-13 ENCOUNTER — Ambulatory Visit: Payer: Commercial Managed Care - HMO | Admitting: Licensed Clinical Social Worker

## 2014-09-26 ENCOUNTER — Other Ambulatory Visit: Payer: Self-pay | Admitting: Family

## 2014-10-04 ENCOUNTER — Ambulatory Visit (INDEPENDENT_AMBULATORY_CARE_PROVIDER_SITE_OTHER): Payer: Medicare HMO | Admitting: Licensed Clinical Social Worker

## 2014-10-04 DIAGNOSIS — F332 Major depressive disorder, recurrent severe without psychotic features: Secondary | ICD-10-CM

## 2015-01-13 ENCOUNTER — Other Ambulatory Visit: Payer: Self-pay | Admitting: Family

## 2015-02-11 ENCOUNTER — Ambulatory Visit: Payer: Commercial Managed Care - HMO | Admitting: Family

## 2015-04-25 ENCOUNTER — Ambulatory Visit (INDEPENDENT_AMBULATORY_CARE_PROVIDER_SITE_OTHER): Payer: Commercial Managed Care - HMO | Admitting: Family Medicine

## 2015-04-25 ENCOUNTER — Encounter: Payer: Self-pay | Admitting: Family Medicine

## 2015-04-25 VITALS — BP 142/90 | Temp 98.3°F | Ht 66.75 in | Wt 321.1 lb

## 2015-04-25 DIAGNOSIS — F39 Unspecified mood [affective] disorder: Secondary | ICD-10-CM

## 2015-04-25 DIAGNOSIS — I10 Essential (primary) hypertension: Secondary | ICD-10-CM | POA: Diagnosis not present

## 2015-04-25 DIAGNOSIS — R748 Abnormal levels of other serum enzymes: Secondary | ICD-10-CM

## 2015-04-25 DIAGNOSIS — E669 Obesity, unspecified: Secondary | ICD-10-CM | POA: Diagnosis not present

## 2015-04-25 DIAGNOSIS — E785 Hyperlipidemia, unspecified: Secondary | ICD-10-CM

## 2015-04-25 DIAGNOSIS — F1010 Alcohol abuse, uncomplicated: Secondary | ICD-10-CM

## 2015-04-25 MED ORDER — AMLODIPINE BESYLATE 5 MG PO TABS: 5.0000 mg | ORAL_TABLET | Freq: Every day | ORAL | Status: DC

## 2015-04-25 NOTE — Patient Instructions (Signed)
BEFORE YOU LEAVE: -set up lab appointment in 1 month  -follow up appointment with me in 6 weeks  Quit drinking - please see resources with provided for you if you need help and seek care at the emergency department if you have withdrawal   Please start norvasc 5 mg daily  Set up appointment with your counselor  We recommend the following healthy lifestyle measures: - eat a healthy diet consisting of lots of vegetables, fruits, beans, nuts, seeds, healthy meats such as white chicken and fish  - avoid fried foods, starches, sweet, fast food, processed foods, sodas, red meet and other fattening foods.  - get a least 150 minutes of aerobic exercise per week.

## 2015-04-25 NOTE — Progress Notes (Signed)
Pre visit review using our clinic review tool, if applicable. No additional management support is needed unless otherwise documented below in the visit note.   HPI:  Logan Sanchez is here to establish care. Used to see Logan Sanchez, whom left this practice.  Has the following chronic problems that require follow up and concerns today:  Obesity/HLD: -chronic -no exercise, poor diet -sig alcohol use  -fish oil, lovaza, not on statin of fibrate  HTN: -chronic -meds: losartan-hctz 100-25, potassium - reports he misses his BP medication 1-2 days per week -denies: CP, SOB, DOE, palpitations, swelling  Depression/GAD: -meds: wellbutrin  - take once per day -reports: sees Logan Sanchez -denies: SI, worsening depression  Elevated liver enzymes: -mild, for at least 2 years -heavy alcohol use and hld  -denies: nausea, vomiting, weight loss, bleeding, change in bowels, syncope  ED: -meds: viagra prn  Alcohol Abuse: -drinking 1 pint of vodka per day -reports he can quit easily without withdrawal symptoms, as has done in the past -lost his license due to drinking -denies: nausea, vomiting, weight loss   ROS negative for unless reported above: fevers, unintentional weight loss, hearing or vision loss, chest pain, palpitations, struggling to breath, hemoptysis, melena, hematochezia, hematuria, falls, loc, si, thoughts of self harm  Past Medical History  Diagnosis Date  . Brain injury     report on disability for this, sees Logan Sanchez  . Hypertension   . Obesity   . Hyperlipemia   . Mood disorder     depression and GAD    No past surgical history on file.  No family history on file.  History   Social History  . Marital Status: Single    Spouse Name: N/A  . Number of Children: N/A  . Years of Education: N/A   Social History Main Topics  . Smoking status: Current Every Day Smoker  . Smokeless tobacco: Not on file  . Alcohol Use: Yes  . Drug Use: Not on file  . Sexual  Activity: Not on file   Other Topics Concern  . None   Social History Narrative     Current outpatient prescriptions:  .  amLODipine (NORVASC) 5 MG tablet, Take 1 tablet (5 mg total) by mouth daily., Disp: 90 tablet, Rfl: 3 .  buPROPion (WELLBUTRIN XL) 300 MG 24 hr tablet, TAKE 1 TABLET EVERY DAY, Disp: 30 tablet, Rfl: 3 .  fish oil-omega-3 fatty acids 1000 MG capsule, Take 1 g by mouth daily., Disp: , Rfl:  .  Ginkgo Biloba (GNP GINGKO BILOBA EXTRACT PO), Take 1 capsule by mouth daily., Disp: , Rfl:  .  losartan-hydrochlorothiazide (HYZAAR) 100-25 MG per tablet, TAKE 1 TABLET BY MOUTH DAILY., Disp: 30 tablet, Rfl: 3 .  Misc Natural Products (GINSENG COMPLEX PO), Take 1 tablet by mouth daily., Disp: , Rfl:  .  omega-3 acid ethyl esters (LOVAZA) 1 G capsule, TAKE 2 CAPSULES (2 G TOTAL) BY MOUTH 2 (TWO) TIMES DAILY. (Patient not taking: Reported on 04/25/2015), Disp: 120 capsule, Rfl: 1 .  potassium chloride (K-DUR) 10 MEQ tablet, Take 1 tablet (10 mEq total) by mouth daily. (Patient not taking: Reported on 04/25/2015), Disp: 30 tablet, Rfl: 0 .  sildenafil (VIAGRA) 100 MG tablet, Take 0.5-1 tablets (50-100 mg total) by mouth daily as needed for erectile dysfunction. (Patient not taking: Reported on 04/25/2015), Disp: 3 tablet, Rfl: 0-  EXAM:  Filed Vitals:   04/25/15 1119  BP: 142/90  Temp: 98.3 F (36.8 C)    Body  mass index is 50.7 kg/(m^2).  GENERAL: vitals reviewed and listed above, alert, oriented, appears well hydrated and in no acute distress  HEENT: atraumatic, conjunttiva clear, no obvious abnormalities on inspection of external nose and ears  NECK: no obvious masses on inspection  LUNGS: clear to auscultation bilaterally, no wheezes, rales or rhonchi, good air movement  CV: HRRR, no peripheral edema  MS: moves all extremities without noticeable abnormality  PSYCH: pleasant and cooperative, no obvious depression or anxiety  ASSESSMENT AND PLAN:  Discussed the  following assessment and plan:  Hypertension, uncontrolled - Plan: amLODipine (NORVASC) 5 MG tablet, CMP  Obesity  Morbid obesity - Plan: Lipid Panel, Hemoglobin A1c  Mood disorder  Elevated liver enzymes - Plan: CMP  Hyperlipemia  Alcohol abuse  -We reviewed the PMH, PSH, FH, SH, Meds and Allergies. -We provided refills for any medications we will prescribe as needed. -We addressed current concerns per orders and patient instructions. -We have asked for records for pertinent exams, studies, vaccines and notes from previous providers. -We have advised patient to follow up per instructions below. -add norvasc after discussion risks/benefits -advised to quit drinking and to follow a healthy lifestyle - resources of outpt and inpat help provided -sees Logan Sanchez and advised he schedule appt and see psych if needed -repeat labs - advised to schedule lab vist: CMP, HIV screen?, lipids, hgba1c - discussed if LFT elevated --> RUQ US, Hep panel; if cholesterol elevated and trig under 500 start statin. -Tdap offered -follow up in 6 weeks  -Patient advised to return or notify a doctor immediately if symptoms worsen or persist or new concerns arise.  Patient Instructions  BEFORE YOU LEAVE: -set up lab appointment in 1 month  -follow up appointment with me in 6 weeks  Quit drinking - please see resources with provided for you if you need help and seek care at the emergency department if you have withdrawal   Please start norvasc 5 mg daily  Set up appointment with your counselor  We recommend the following healthy lifestyle measures: - eat a healthy diet consisting of lots of vegetables, fruits, beans, nuts, seeds, healthy meats such as white chicken and fish  - avoid fried foods, starches, sweet, fast food, processed foods, sodas, red meet and other fattening foods.  - get a least 150 minutes of aerobic exercise per week.       Kriste BasqueKIM, HANNAH R.

## 2015-05-07 ENCOUNTER — Ambulatory Visit (INDEPENDENT_AMBULATORY_CARE_PROVIDER_SITE_OTHER): Payer: Commercial Managed Care - HMO | Admitting: Licensed Clinical Social Worker

## 2015-05-07 DIAGNOSIS — F332 Major depressive disorder, recurrent severe without psychotic features: Secondary | ICD-10-CM

## 2015-05-22 ENCOUNTER — Ambulatory Visit (INDEPENDENT_AMBULATORY_CARE_PROVIDER_SITE_OTHER): Payer: Medicare HMO | Admitting: Licensed Clinical Social Worker

## 2015-05-22 DIAGNOSIS — F332 Major depressive disorder, recurrent severe without psychotic features: Secondary | ICD-10-CM

## 2015-05-27 ENCOUNTER — Other Ambulatory Visit (INDEPENDENT_AMBULATORY_CARE_PROVIDER_SITE_OTHER): Payer: Commercial Managed Care - HMO

## 2015-05-27 ENCOUNTER — Other Ambulatory Visit: Payer: Self-pay | Admitting: *Deleted

## 2015-05-27 DIAGNOSIS — R748 Abnormal levels of other serum enzymes: Secondary | ICD-10-CM | POA: Diagnosis not present

## 2015-05-27 DIAGNOSIS — I10 Essential (primary) hypertension: Secondary | ICD-10-CM

## 2015-05-27 DIAGNOSIS — R7989 Other specified abnormal findings of blood chemistry: Secondary | ICD-10-CM

## 2015-05-27 LAB — COMPREHENSIVE METABOLIC PANEL
ALT: 39 U/L (ref 0–53)
AST: 27 U/L (ref 0–37)
Albumin: 3.8 g/dL (ref 3.5–5.2)
Alkaline Phosphatase: 57 U/L (ref 39–117)
BILIRUBIN TOTAL: 0.4 mg/dL (ref 0.2–1.2)
BUN: 14 mg/dL (ref 6–23)
CO2: 29 mEq/L (ref 19–32)
Calcium: 9.1 mg/dL (ref 8.4–10.5)
Chloride: 103 mEq/L (ref 96–112)
Creatinine, Ser: 1.08 mg/dL (ref 0.40–1.50)
GFR: 95.95 mL/min (ref >60.00)
Glucose, Bld: 89 mg/dL (ref 70–99)
Potassium: 3.7 mEq/L (ref 3.5–5.1)
SODIUM: 137 meq/L (ref 135–145)
TOTAL PROTEIN: 6.8 g/dL (ref 6.0–8.3)

## 2015-05-27 LAB — LIPID PANEL
CHOLESTEROL: 197 mg/dL (ref 0–200)
HDL: 26 mg/dL — AB (ref >39.00)
NonHDL: 171
Total CHOL/HDL Ratio: 8
Triglycerides: 249 mg/dL — ABNORMAL HIGH (ref 0.0–149.0)
VLDL: 49.8 mg/dL — ABNORMAL HIGH (ref 0.0–40.0)

## 2015-05-27 LAB — HEMOGLOBIN A1C: Hgb A1c MFr Bld: 5.6 % (ref 4.6–6.5)

## 2015-05-27 LAB — LDL CHOLESTEROL, DIRECT: Direct LDL: 114 mg/dL

## 2015-05-27 MED ORDER — OMEGA-3-ACID ETHYL ESTERS 1 G PO CAPS: ORAL_CAPSULE | ORAL | Status: DC

## 2015-06-06 ENCOUNTER — Ambulatory Visit (INDEPENDENT_AMBULATORY_CARE_PROVIDER_SITE_OTHER): Payer: Commercial Managed Care - HMO | Admitting: Family Medicine

## 2015-06-06 ENCOUNTER — Encounter: Payer: Self-pay | Admitting: Family Medicine

## 2015-06-06 VITALS — BP 138/100 | HR 106 | Temp 98.5°F | Ht 66.75 in | Wt 312.1 lb

## 2015-06-06 DIAGNOSIS — F1010 Alcohol abuse, uncomplicated: Secondary | ICD-10-CM

## 2015-06-06 DIAGNOSIS — M7918 Myalgia, other site: Secondary | ICD-10-CM

## 2015-06-06 DIAGNOSIS — E669 Obesity, unspecified: Secondary | ICD-10-CM | POA: Diagnosis not present

## 2015-06-06 DIAGNOSIS — M791 Myalgia: Secondary | ICD-10-CM

## 2015-06-06 DIAGNOSIS — E785 Hyperlipidemia, unspecified: Secondary | ICD-10-CM

## 2015-06-06 DIAGNOSIS — I10 Essential (primary) hypertension: Secondary | ICD-10-CM | POA: Diagnosis not present

## 2015-06-06 MED ORDER — PRAVASTATIN SODIUM 20 MG PO TABS: 20.0000 mg | ORAL_TABLET | Freq: Every day | ORAL | Status: DC

## 2015-06-06 NOTE — Progress Notes (Signed)
HPI:  Logan Sanchez is here to establish care. Used to see [name], whom left this practice.  Has the following chronic problems that require follow up and concerns today:  Obesity/HLD: -chronic -no exercise, poor diet -sig alcohol use -fish oil, stopped the lovaza due to cost and he wants to do a statin instead  HTN: -chronic -meds: losartan-hctz 100-25, potassium - reports he misses his BP medication 1-2 days per week - missed BP meds today -denies: CP, SOB, DOE, palpitations, swelling  Depression/GAD: -meds: wellbutrin - take once per day -reports: sees susan bond -denies: SI, worsening depression  ED: -meds: viagra prn  Alcohol Abuse: -drinking 1 pint of vodka per day initially, agreed to quit or cut back last visit significantly and now reports two regular shots twice per week -reports he quit easily without withdrawal symptoms, as has done in the past -lost his license due to drinking -denies: nausea, vomiting, weight loss  R buttock pain: -started about 2 years ago, worse recently -right where he sits on a very large wallet -denies: weakness, numbness, bowel or bladder incontinence  ROS negative for unless reported above: fevers, unintentional weight loss, hearing or vision loss, chest pain, palpitations, struggling to breath, hemoptysis, melena, hematochezia, hematuria, falls, loc, si, thoughts of self harm   ROS: See pertinent positives and negatives per HPI.  Past Medical History  Diagnosis Date  . Brain injury     report on disability for this, sees Dr. Arvilla Market  . Hypertension   . Obesity   . Hyperlipemia   . Mood disorder     depression and GAD  . Alcohol abuse   . Elevated LFTs   . Erectile dysfunction     No past surgical history on file.  No family history on file.  History   Social History  . Marital Status: Single    Spouse Name: N/A  . Number of Children: N/A  . Years of Education: N/A   Social History Main Topics  .  Smoking status: Former Games developer  . Smokeless tobacco: Not on file  . Alcohol Use: 0.0 oz/week    0 Standard drinks or equivalent per week  . Drug Use: Not on file  . Sexual Activity: Not on file   Other Topics Concern  . None   Social History Narrative   Work or School: on disability for brain injury      Home Situation:       Spiritual Beliefs:      Lifestyle: no regular exercise, poor diet           Current outpatient prescriptions:  .  amLODipine (NORVASC) 5 MG tablet, Take 1 tablet (5 mg total) by mouth daily., Disp: 90 tablet, Rfl: 3 .  buPROPion (WELLBUTRIN XL) 300 MG 24 hr tablet, TAKE 1 TABLET EVERY DAY, Disp: 30 tablet, Rfl: 3 .  fish oil-omega-3 fatty acids 1000 MG capsule, Take 1 g by mouth daily., Disp: , Rfl:  .  Ginkgo Biloba (GNP GINGKO BILOBA EXTRACT PO), Take 1 capsule by mouth daily., Disp: , Rfl:  .  losartan-hydrochlorothiazide (HYZAAR) 100-25 MG per tablet, TAKE 1 TABLET BY MOUTH DAILY., Disp: 30 tablet, Rfl: 3 .  Misc Natural Products (GINSENG COMPLEX PO), Take 1 tablet by mouth daily., Disp: , Rfl:  .  omega-3 acid ethyl esters (LOVAZA) 1 G capsule, TAKE 2 CAPSULES (2 G TOTAL) BY MOUTH 2 (TWO) TIMES DAILY., Disp: 120 capsule, Rfl: 1 .  potassium chloride (K-DUR) 10 MEQ tablet, Take  1 tablet (10 mEq total) by mouth daily., Disp: 30 tablet, Rfl: 0 .  sildenafil (VIAGRA) 100 MG tablet, Take 0.5-1 tablets (50-100 mg total) by mouth daily as needed for erectile dysfunction., Disp: 3 tablet, Rfl: 0- .  pravastatin (PRAVACHOL) 20 MG tablet, Take 1 tablet (20 mg total) by mouth daily., Disp: 90 tablet, Rfl: 3  EXAM:  Filed Vitals:   06/06/15 1518  BP: 138/100  Pulse: 106  Temp: 98.5 F (36.9 C)    Body mass index is 49.27 kg/(m^2).  GENERAL: vitals reviewed and listed above, alert, oriented, appears well hydrated and in no acute distress  HEENT: atraumatic, conjunttiva clear, no obvious abnormalities on inspection of external nose and ears  NECK: no  obvious masses on inspection  LUNGS: clear to auscultation bilaterally, no wheezes, rales or rhonchi, good air movement  CV: HRRR, no peripheral edema  MS: moves all extremities without noticeable abnormality Normal Gait Normal inspection of back, no obvious scoliosis or leg length descrepancy No bony TTP Soft tissue TTP at: R glutes in area of piriformis -/+ tests: neg trendelenburg,-facet loading, -SLRT, -CLRT, -FABER, -FADIR Normal muscle strength, sensation to light touch and DTRs in LEs bilaterally  PSYCH: pleasant and cooperative, no obvious depression or anxiety  ASSESSMENT AND PLAN:  Discussed the following assessment and plan:  Hypertension, uncontrolled  Obesity  Morbid obesity  Alcohol abuse  Right buttock pain  Hyperlipemia - Plan: pravastatin (PRAVACHOL) 20 MG tablet  -Patient advised to return or notify a doctor immediately if symptoms worsen or persist or new concerns arise.  Patient Instructions  Follow in about 3 month, call in one month if the pain remains  Do not put your wallet in your back pocket  Start the cholesterol medications  Take your medications every day  We recommend the following healthy lifestyle measures: - eat a healthy diet consisting of lots of vegetables, fruits, beans, nuts, seeds, healthy meats such as white chicken and fish and whole grains.  - avoid fried foods, fast food, processed foods, sodas, red meet and other fattening foods.  - get a least 150 minutes of aerobic exercise per week.       Kriste BasqueKIM, HANNAH R.

## 2015-06-06 NOTE — Progress Notes (Signed)
Pre visit review using our clinic review tool, if applicable. No additional management support is needed unless otherwise documented below in the visit note. 

## 2015-06-06 NOTE — Patient Instructions (Addendum)
Follow in about 3 month, call in one month if the pain remains  Do not put your wallet in your back pocket  Start the cholesterol medications  Take your medications every day  We recommend the following healthy lifestyle measures: - eat a healthy diet consisting of lots of vegetables, fruits, beans, nuts, seeds, healthy meats such as white chicken and fish and whole grains.  - avoid fried foods, fast food, processed foods, sodas, red meet and other fattening foods.  - get a least 150 minutes of aerobic exercise per week.

## 2015-06-18 ENCOUNTER — Ambulatory Visit (INDEPENDENT_AMBULATORY_CARE_PROVIDER_SITE_OTHER): Payer: Medicare HMO | Admitting: Licensed Clinical Social Worker

## 2015-06-18 DIAGNOSIS — F332 Major depressive disorder, recurrent severe without psychotic features: Secondary | ICD-10-CM

## 2015-07-24 ENCOUNTER — Ambulatory Visit (INDEPENDENT_AMBULATORY_CARE_PROVIDER_SITE_OTHER): Payer: Commercial Managed Care - HMO | Admitting: Licensed Clinical Social Worker

## 2015-07-24 DIAGNOSIS — F332 Major depressive disorder, recurrent severe without psychotic features: Secondary | ICD-10-CM

## 2015-07-26 ENCOUNTER — Encounter (HOSPITAL_COMMUNITY): Payer: Self-pay | Admitting: Emergency Medicine

## 2015-07-26 ENCOUNTER — Emergency Department (HOSPITAL_COMMUNITY)
Admission: EM | Admit: 2015-07-26 | Discharge: 2015-07-26 | Disposition: A | Discharge status: Home / Self Care | Payer: Commercial Managed Care - HMO | Attending: Emergency Medicine | Admitting: Emergency Medicine

## 2015-07-26 DIAGNOSIS — Z87820 Personal history of traumatic brain injury: Secondary | ICD-10-CM | POA: Diagnosis not present

## 2015-07-26 DIAGNOSIS — M25551 Pain in right hip: Secondary | ICD-10-CM | POA: Diagnosis not present

## 2015-07-26 DIAGNOSIS — E785 Hyperlipidemia, unspecified: Secondary | ICD-10-CM | POA: Insufficient documentation

## 2015-07-26 DIAGNOSIS — Z87891 Personal history of nicotine dependence: Secondary | ICD-10-CM | POA: Diagnosis not present

## 2015-07-26 DIAGNOSIS — N529 Male erectile dysfunction, unspecified: Secondary | ICD-10-CM | POA: Insufficient documentation

## 2015-07-26 DIAGNOSIS — E669 Obesity, unspecified: Secondary | ICD-10-CM | POA: Insufficient documentation

## 2015-07-26 DIAGNOSIS — Z79899 Other long term (current) drug therapy: Secondary | ICD-10-CM | POA: Insufficient documentation

## 2015-07-26 DIAGNOSIS — I10 Essential (primary) hypertension: Secondary | ICD-10-CM | POA: Insufficient documentation

## 2015-07-26 MED ORDER — MELOXICAM 7.5 MG PO TABS: 7.5000 mg | ORAL_TABLET | Freq: Every day | ORAL | Status: DC

## 2015-07-26 NOTE — ED Notes (Signed)
Patient c/o right hip, buttock and leg pain.  Patient states his PMD told him he needs to lose weight and he understands that, but when he tries to exercise he hurts.

## 2015-07-26 NOTE — Discharge Instructions (Signed)
Arthritis, Nonspecific °Arthritis is inflammation of a joint. This usually means pain, redness, warmth or swelling are present. One or more joints may be involved. There are a number of types of arthritis. Your caregiver may not be able to tell what type of arthritis you have right away. °CAUSES  °The most common cause of arthritis is the wear and tear on the joint (osteoarthritis). This causes damage to the cartilage, which can break down over time. The knees, hips, back and neck are most often affected by this type of arthritis. °Other types of arthritis and common causes of joint pain include: °· Sprains and other injuries near the joint. Sometimes minor sprains and injuries cause pain and swelling that develop hours later. °· Rheumatoid arthritis. This affects hands, feet and knees. It usually affects both sides of your body at the same time. It is often associated with chronic ailments, fever, weight loss and general weakness. °· Crystal arthritis. Gout and pseudo gout can cause occasional acute severe pain, redness and swelling in the foot, ankle, or knee. °· Infectious arthritis. Bacteria can get into a joint through a break in overlying skin. This can cause infection of the joint. Bacteria and viruses can also spread through the blood and affect your joints. °· Drug, infectious and allergy reactions. Sometimes joints can become mildly painful and slightly swollen with these types of illnesses. °SYMPTOMS  °· Pain is the main symptom. °· Your joint or joints can also be red, swollen and warm or hot to the touch. °· You may have a fever with certain types of arthritis, or even feel overall ill. °· The joint with arthritis will hurt with movement. Stiffness is present with some types of arthritis. °DIAGNOSIS  °Your caregiver will suspect arthritis based on your description of your symptoms and on your exam. Testing may be needed to find the type of arthritis: °· Blood and sometimes urine tests. °· X-ray tests  and sometimes CT or MRI scans. °· Removal of fluid from the joint (arthrocentesis) is done to check for bacteria, crystals or other causes. Your caregiver (or a specialist) will numb the area over the joint with a local anesthetic, and use a needle to remove joint fluid for examination. This procedure is only minimally uncomfortable. °· Even with these tests, your caregiver may not be able to tell what kind of arthritis you have. Consultation with a specialist (rheumatologist) may be helpful. °TREATMENT  °Your caregiver will discuss with you treatment specific to your type of arthritis. If the specific type cannot be determined, then the following general recommendations may apply. °Treatment of severe joint pain includes: °· Rest. °· Elevation. °· Anti-inflammatory medication (for example, ibuprofen) may be prescribed. Avoiding activities that cause increased pain. °· Only take over-the-counter or prescription medicines for pain and discomfort as recommended by your caregiver. °· Cold packs over an inflamed joint may be used for 10 to 15 minutes every hour. Hot packs sometimes feel better, but do not use overnight. Do not use hot packs if you are diabetic without your caregiver's permission. °· A cortisone shot into arthritic joints may help reduce pain and swelling. °· Any acute arthritis that gets worse over the next 1 to 2 days needs to be looked at to be sure there is no joint infection. °Long-term arthritis treatment involves modifying activities and lifestyle to reduce joint stress jarring. This can include weight loss. Also, exercise is needed to nourish the joint cartilage and remove waste. This helps keep the muscles   around the joint strong. °HOME CARE INSTRUCTIONS  °· Do not take aspirin to relieve pain if gout is suspected. This elevates uric acid levels. °· Only take over-the-counter or prescription medicines for pain, discomfort or fever as directed by your caregiver. °· Rest the joint as much as  possible. °· If your joint is swollen, keep it elevated. °· Use crutches if the painful joint is in your leg. °· Drinking plenty of fluids may help for certain types of arthritis. °· Follow your caregiver's dietary instructions. °· Try low-impact exercise such as: °¨ Swimming. °¨ Water aerobics. °¨ Biking. °¨ Walking. °· Morning stiffness is often relieved by a warm shower. °· Put your joints through regular range-of-motion. °SEEK MEDICAL CARE IF:  °· You do not feel better in 24 hours or are getting worse. °· You have side effects to medications, or are not getting better with treatment. °SEEK IMMEDIATE MEDICAL CARE IF:  °· You have a fever. °· You develop severe joint pain, swelling or redness. °· Many joints are involved and become painful and swollen. °· There is severe back pain and/or leg weakness. °· You have loss of bowel or bladder control. °Document Released: 12/24/2004 Document Revised: 02/08/2012 Document Reviewed: 01/09/2009 °Exit[employer]® Patient Information ©2015 [employer]. This information is not intended to replace advice given to you by your health care provider. Make sure you discuss any questions you have with your health care provider. ° °

## 2015-07-26 NOTE — ED Provider Notes (Signed)
CSN: [id number]     Arrival date & time 07/26/15  1014 History   First MD Initiated Contact with Patient 07/26/15 1051     Chief Complaint  Patient presents with  . Hip Pain     (Consider location/radiation/quality/duration/timing/severity/associated sxs/prior Treatment) Patient is a 44 y.o. male presenting with hip pain. The history is provided by the patient. No language interpreter was used.  Hip Pain This is a new problem. The current episode started today. The problem occurs constantly. The problem has been unchanged. Pertinent negatives include no fever or joint swelling. Nothing aggravates the symptoms. He has tried nothing for the symptoms. The treatment provided moderate relief.  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  Pt reports his Md told him he needs weight loss and exercise.  Pt reports he is unable to exercise due to    Past Medical History  Diagnosis Date  . Brain injury     report on disability for this, sees Dr. Arvilla Market  . Hypertension   . Obesity   . Hyperlipemia   . Mood disorder     depression and GAD  . Alcohol abuse   . Elevated LFTs   . Erectile dysfunction    History reviewed. No pertinent past surgical history. No family history on file. Social History  Substance Use Topics  . Smoking status: Former Games developer  . Smokeless tobacco: None  . Alcohol Use: 0.0 oz/week    0 Standard drinks or equivalent per week    Review of Systems  Constitutional: Negative for fever.  Musculoskeletal: Negative for joint swelling.  All other systems reviewed and are negative.     Allergies  Review of patient's allergies indicates no known allergies.  Home Medications   Prior to Admission medications   Medication Sig Start Date End Date Taking? Authorizing Provider  amLODipine (NORVASC) 5 MG tablet Take 1 tablet (5 mg total) by mouth daily. 04/25/15   Terressa Koyanagi, DO  buPROPion (WELLBUTRIN XL) 300 MG 24 hr tablet TAKE 1 TABLET EVERY DAY 09/26/14   Eulis Foster, FNP  fish oil-omega-3 fatty acids 1000 MG capsule Take 1 g by mouth daily.    Historical Provider, MD  Ginkgo Biloba (GNP GINGKO BILOBA EXTRACT PO) Take 1 capsule by mouth daily.    Historical Provider, MD  losartan-hydrochlorothiazide (HYZAAR) 100-25 MG per tablet TAKE 1 TABLET BY MOUTH DAILY. 01/14/15   Eulis Foster, FNP  meloxicam (MOBIC) 7.5 MG tablet Take 1 tablet (7.5 mg total) by mouth daily. 07/26/15   Elson Areas, PA-C  Misc Natural Products [facility] COMPLEX PO) Take 1 tablet by mouth daily.    Historical Provider, MD  omega-3 acid ethyl esters (LOVAZA) 1 G capsule TAKE 2 CAPSULES (2 G TOTAL) BY MOUTH 2 (TWO) TIMES DAILY. 05/27/15   Terressa Koyanagi, DO  potassium chloride (K-DUR) 10 MEQ tablet Take 1 tablet (10 mEq total) by mouth daily. 08/14/14   Eulis Foster, FNP  pravastatin (PRAVACHOL) 20 MG tablet Take 1 tablet (20 mg total) by mouth daily. 06/06/15   Terressa Koyanagi, DO  sildenafil (VIAGRA) 100 MG tablet Take 0.5-1 tablets (50-100 mg total) by mouth daily as needed for erectile dysfunction. 02/19/14   Eulis Foster, FNP   BP 155/101 mmHg  Pulse 106  Temp(Src) 98.2 F (36.8 C) (Oral)  Resp 20  SpO2 95% Physical Exam  Constitutional: He is oriented to person, place, and time. He appears well-developed and well-nourished.  HENT:  Head: Normocephalic.  Eyes: Pupils are equal, round, and reactive to light.  Neck: Normal range of motion.  Cardiovascular: Normal rate and normal heart sounds.   Pulmonary/Chest: Effort normal and breath sounds normal.  Abdominal: Soft.  Neurological: He is alert and oriented to person, place, and time. He has normal reflexes.  Skin: Skin is warm.  Psychiatric: He has a normal mood and affect.  Nursing note and vitals reviewed.   ED Course  Procedures (including critical care time) Labs Review Labs Reviewed - No data to display  Imaging Review No results found. I have personally reviewed and evaluated these images and lab results as  part of my medical decision-making.   EKG Interpretation None      MDM  Pt reports he wants to try and arthritis medication again.   I will refer pt to Orthopaedist and have him try meloxicam    Final diagnoses:  Hip pain, acute, right    Results for orders placed or performed in visit on 05/27/15  Lipid Panel  Result Value Ref Range   Cholesterol 197 0 - 200 mg/dL   Triglycerides 130.8 (H) 0.0 - 149.0 mg/dL   HDL 65.78 (L) >46.96 mg/dL   VLDL 29.5 (H) 0.0 - 28.4 mg/dL   Total CHOL/HDL Ratio 8    NonHDL 171.00   CMP  Result Value Ref Range   Sodium 137 135 - 145 mEq/L   Potassium 3.7 3.5 - 5.1 mEq/L   Chloride 103 96 - 112 mEq/L   CO2 29 19 - 32 mEq/L   Glucose, Bld 89 70 - 99 mg/dL   BUN 14 6 - 23 mg/dL   Creatinine, Ser 1.32 0.40 - 1.50 mg/dL   Total Bilirubin 0.4 0.2 - 1.2 mg/dL   Alkaline Phosphatase 57 39 - 117 U/L   AST 27 0 - 37 U/L   ALT 39 0 - 53 U/L   Total Protein 6.8 6.0 - 8.3 g/dL   Albumin 3.8 3.5 - 5.2 g/dL   Calcium 9.1 8.4 - 44.0 mg/dL   GFR 10.27 >25.36 mL/min  Hemoglobin A1c  Result Value Ref Range   Hgb A1c MFr Bld 5.6 4.6 - 6.5 %  LDL cholesterol, direct  Result Value Ref Range   Direct LDL 114.0 mg/dL   No results found.     Lonia Skinner [place], PA-C 07/26/15 1558  Azalia Bilis, MD 07/26/15 [phone]

## 2015-08-20 ENCOUNTER — Ambulatory Visit (INDEPENDENT_AMBULATORY_CARE_PROVIDER_SITE_OTHER): Payer: Medicare HMO | Admitting: Licensed Clinical Social Worker

## 2015-08-20 DIAGNOSIS — F332 Major depressive disorder, recurrent severe without psychotic features: Secondary | ICD-10-CM | POA: Diagnosis not present

## 2015-09-06 ENCOUNTER — Ambulatory Visit (INDEPENDENT_AMBULATORY_CARE_PROVIDER_SITE_OTHER): Payer: Commercial Managed Care - HMO | Admitting: Family Medicine

## 2015-09-06 ENCOUNTER — Encounter: Payer: Self-pay | Admitting: Family Medicine

## 2015-09-06 VITALS — BP 120/88 | HR 90 | Temp 99.1°F | Ht 66.75 in | Wt 316.7 lb

## 2015-09-06 DIAGNOSIS — I10 Essential (primary) hypertension: Secondary | ICD-10-CM

## 2015-09-06 DIAGNOSIS — S069X9S Unspecified intracranial injury with loss of consciousness of unspecified duration, sequela: Secondary | ICD-10-CM

## 2015-09-06 DIAGNOSIS — F3342 Major depressive disorder, recurrent, in full remission: Secondary | ICD-10-CM

## 2015-09-06 DIAGNOSIS — E785 Hyperlipidemia, unspecified: Secondary | ICD-10-CM | POA: Diagnosis not present

## 2015-09-06 DIAGNOSIS — M791 Myalgia: Secondary | ICD-10-CM

## 2015-09-06 DIAGNOSIS — S069XAA Unspecified intracranial injury with loss of consciousness status unknown, initial encounter: Secondary | ICD-10-CM | POA: Insufficient documentation

## 2015-09-06 DIAGNOSIS — S069X9A Unspecified intracranial injury with loss of consciousness of unspecified duration, initial encounter: Secondary | ICD-10-CM | POA: Insufficient documentation

## 2015-09-06 DIAGNOSIS — M7918 Myalgia, other site: Secondary | ICD-10-CM

## 2015-09-06 DIAGNOSIS — F1010 Alcohol abuse, uncomplicated: Secondary | ICD-10-CM

## 2015-09-06 MED ORDER — NAPROXEN 500 MG PO TABS: ORAL_TABLET | ORAL | Status: DC

## 2015-09-06 NOTE — Progress Notes (Signed)
HPI:  Obesity/HLD: -chronic -no exercise, poor diet -sig alcohol use -fish oil, statin advised last visit  HTN: -chronic -meds: losartan-hctz 100-25, potassium - reported frequent missed doses last visit and advised to take daily -reports: now is taking his medications daily -denies: CP, SOB, DOE, palpitations, swelling  Depression/GAD: -meds: wellbutrin - take once per day -reports: sees susan bond -denies: SI, worsening depression -on disability for hx of brain injury  ED: -meds: viagra prn  Alcohol Abuse: -advised to quit and he had cut back per his report significantly at the last visit -today reports: back to 2 beers per day plus several shots several days per week -reports he quit easily without withdrawal symptoms, as has done in the past -lost his license due to drinking -denies: nausea, vomiting, weight loss -LFTs normal 05/2015  R buttock pain: -started about 2 years ago, we thought it may have been related to sitting on a very large wallet -at last visit, advised to remove wallet and call in 1 month if not resolved -on ROC wen to ED since and per notes, was started on meloxicam and referred to Ortho -plain films showed 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  Past Medical History  Diagnosis Date  . Brain injury [facility])     report on disability for this, sees Dr. Arvilla Market  . Hypertension   . Obesity   . Hyperlipemia   . Mood disorder (HCC)     depression and GAD  . Alcohol abuse   . Elevated LFTs   . Erectile dysfunction     No past surgical history on file.  No family history on file.  Social History   Social History  . Marital Status: Single    Spouse Name: N/A  . Number of  Children: N/A  . Years of Education: N/A   Social History Main Topics  . Smoking status: Former Games developer  . Smokeless tobacco: None  . Alcohol Use: 0.0 oz/week    0 Standard drinks or equivalent per week  . Drug Use: No  . Sexual Activity: Not Asked   Other Topics Concern  . None   Social History Narrative   Work or School: on disability for brain injury      Home Situation:       Spiritual Beliefs:      Lifestyle: no regular exercise, poor diet           Current outpatient prescriptions:  .  amLODipine (NORVASC) 5 MG tablet, Take 1 tablet (5 mg total) by mouth daily., Disp: 90 tablet, Rfl: 3 .  buPROPion (WELLBUTRIN XL) 300 MG 24 hr tablet, TAKE 1 TABLET EVERY DAY, Disp: 30 tablet, Rfl: 3 .  fish oil-omega-3 fatty acids 1000 MG capsule, Take 1 g by mouth daily., Disp: , Rfl:  .  Ginkgo Biloba (GNP GINGKO BILOBA EXTRACT PO), Take 1 capsule by mouth daily., Disp: , Rfl:  .  losartan-hydrochlorothiazide (HYZAAR) 100-25 MG per tablet, TAKE 1 TABLET BY MOUTH DAILY., Disp: 30 tablet, Rfl: 3 .  meloxicam (MOBIC) 7.5 MG tablet, Take 1 tablet (7.5 mg total) by mouth daily., Disp: 30 tablet, Rfl: 0 .  Misc Natural Products (GINSENG COMPLEX PO), Take 1 tablet by mouth daily., Disp: , Rfl:  .  potassium chloride (K-DUR) 10 MEQ tablet, Take 1 tablet (10 mEq total) by mouth daily., Disp: 30 tablet, Rfl: 0 .  pravastatin (PRAVACHOL) 20 MG tablet, Take 1 tablet (20 mg total) by mouth daily., Disp: 90 tablet, Rfl: 3 .  sildenafil (VIAGRA) 100 MG tablet, Take 0.5-1 tablets (50-100 mg total) by mouth daily as needed for erectile dysfunction., Disp: 3 tablet, Rfl: 0- .  naproxen (NAPROSYN) 500 MG tablet, Use on a limited basis for exercise and bad days., Disp: 30 tablet, Rfl: 0  EXAM:  Filed Vitals:   09/06/15 1331  BP: 120/88  Pulse: 90  Temp: 99.1 F (37.3 C)    Body mass index is 50 kg/(m^2).  GENERAL: vitals reviewed and listed above, alert, oriented, appears well hydrated and in  no acute distress  HEENT: atraumatic, conjunttiva clear, no obvious abnormalities on inspection of external nose and ears  NECK: no obvious masses on inspection  LUNGS: clear to auscultation bilaterally, no wheezes, rales or rhonchi, good air movement  CV: HRRR, no peripheral edema  MS: moves all extremities without noticeable abnormality  PSYCH: pleasant and cooperative, no obvious depression or anxiety  ASSESSMENT AND PLAN:  Discussed the following assessment and plan:  Recurrent major depressive disorder, in full remission (HCC)  Hypertension, uncontrolled  Morbid obesity, unspecified obesity type (HCC)  Hyperlipemia  Alcohol abuse  Brain injury, with loss of consciousness of unspecified duration, sequela (HCC)  Right buttock pain - Plan: Ambulatory referral to Orthopedic Surgery  -discussed risks/benefits of various pain treatment options - opted for naproxen for bad days -referred to ortho for further eval -lifestyle recs -advised again of serious risks with alcohol use and advised to quit -Patient advised to return or notify a doctor immediately if symptoms worsen or persist or new concerns arise.  Patient Instructions  BEFORE YOU LEAVE: -schedule follow up in 3 months  STOP drinking alcohol  Use naproxen as prescribed not more then 2 times daily for pain only on days that you exercise or on bad days, avoid daily use. Can use topical sports creams as well. Do not use any other pain medications in combination with this.  We placed a referral for you as discussed to the orthopedic doctor. It usually takes about 1-2 weeks to process and schedule this referral. If you have not heard from [place] regarding this appointment in 2 weeks please contact our office.  We recommend the following healthy lifestyle measures: - eat a healthy whole foods diet consisting of regular small meals composed of vegetables, fruits, beans, nuts, seeds, healthy meats such as white chicken and  fish and whole grains.  - avoid sweets, white starchy foods, fried foods, fast food, processed foods, sodas, red meet and other fattening foods.  - get a least 150-300 minutes of aerobic exercise per week.       Kriste Basque R.

## 2015-09-06 NOTE — Progress Notes (Signed)
Pre visit review using our clinic review tool, if applicable. No additional management support is needed unless otherwise documented below in the visit note. 

## 2015-09-06 NOTE — Patient Instructions (Signed)
BEFORE YOU LEAVE: -schedule follow up in 3 months  STOP drinking alcohol  Use naproxen as prescribed not more then 2 times daily for pain only on days that you exercise or on bad days, avoid daily use. Can use topical sports creams as well. Do not use any other pain medications in combination with this.  We placed a referral for you as discussed to the orthopedic doctor. It usually takes about 1-2 weeks to process and schedule this referral. If you have not heard from [place] regarding this appointment in 2 weeks please contact our office.  We recommend the following healthy lifestyle measures: - eat a healthy whole foods diet consisting of regular small meals composed of vegetables, fruits, beans, nuts, seeds, healthy meats such as white chicken and fish and whole grains.  - avoid sweets, white starchy foods, fried foods, fast food, processed foods, sodas, red meet and other fattening foods.  - get a least 150-300 minutes of aerobic exercise per week.

## 2015-09-12 ENCOUNTER — Other Ambulatory Visit: Payer: Self-pay | Admitting: Sports Medicine

## 2015-09-12 DIAGNOSIS — M5441 Lumbago with sciatica, right side: Secondary | ICD-10-CM

## 2015-09-17 ENCOUNTER — Ambulatory Visit (INDEPENDENT_AMBULATORY_CARE_PROVIDER_SITE_OTHER): Payer: Medicare HMO | Admitting: Licensed Clinical Social Worker

## 2015-09-17 DIAGNOSIS — F332 Major depressive disorder, recurrent severe without psychotic features: Secondary | ICD-10-CM | POA: Diagnosis not present

## 2015-09-28 ENCOUNTER — Ambulatory Visit
Admission: RE | Admit: 2015-09-28 | Discharge: 2015-09-28 | Disposition: A | Discharge status: Home / Self Care | Payer: Commercial Managed Care - HMO | Source: Ambulatory Visit | Attending: Sports Medicine | Admitting: Sports Medicine

## 2015-09-28 DIAGNOSIS — M5126 Other intervertebral disc displacement, lumbar region: Secondary | ICD-10-CM | POA: Diagnosis not present

## 2015-09-28 DIAGNOSIS — M5441 Lumbago with sciatica, right side: Secondary | ICD-10-CM

## 2015-10-02 ENCOUNTER — Other Ambulatory Visit: Payer: Self-pay | Admitting: Family

## 2015-10-03 DIAGNOSIS — M5441 Lumbago with sciatica, right side: Secondary | ICD-10-CM | POA: Diagnosis not present

## 2015-10-09 ENCOUNTER — Ambulatory Visit: Payer: Commercial Managed Care - HMO | Admitting: Licensed Clinical Social Worker

## 2015-10-16 ENCOUNTER — Ambulatory Visit (INDEPENDENT_AMBULATORY_CARE_PROVIDER_SITE_OTHER): Payer: Medicare HMO | Admitting: Licensed Clinical Social Worker

## 2015-10-16 DIAGNOSIS — F332 Major depressive disorder, recurrent severe without psychotic features: Secondary | ICD-10-CM | POA: Diagnosis not present

## 2015-12-09 ENCOUNTER — Ambulatory Visit (INDEPENDENT_AMBULATORY_CARE_PROVIDER_SITE_OTHER): Payer: Commercial Managed Care - HMO | Admitting: Interventional Cardiology

## 2015-12-09 ENCOUNTER — Encounter: Payer: Self-pay | Admitting: *Deleted

## 2015-12-09 ENCOUNTER — Ambulatory Visit (INDEPENDENT_AMBULATORY_CARE_PROVIDER_SITE_OTHER): Payer: Commercial Managed Care - HMO | Admitting: Family Medicine

## 2015-12-09 ENCOUNTER — Other Ambulatory Visit: Payer: Self-pay | Admitting: Interventional Cardiology

## 2015-12-09 ENCOUNTER — Encounter: Payer: Self-pay | Admitting: Family Medicine

## 2015-12-09 ENCOUNTER — Encounter: Payer: Self-pay | Admitting: Interventional Cardiology

## 2015-12-09 VITALS — BP 136/86 | HR 109 | Temp 98.4°F | Ht 66.75 in | Wt 322.4 lb

## 2015-12-09 DIAGNOSIS — I209 Angina pectoris, unspecified: Secondary | ICD-10-CM | POA: Insufficient documentation

## 2015-12-09 DIAGNOSIS — Z87891 Personal history of nicotine dependence: Secondary | ICD-10-CM

## 2015-12-09 DIAGNOSIS — E785 Hyperlipidemia, unspecified: Secondary | ICD-10-CM

## 2015-12-09 DIAGNOSIS — R079 Chest pain, unspecified: Secondary | ICD-10-CM

## 2015-12-09 DIAGNOSIS — S069X9S Unspecified intracranial injury with loss of consciousness of unspecified duration, sequela: Secondary | ICD-10-CM

## 2015-12-09 DIAGNOSIS — I10 Essential (primary) hypertension: Secondary | ICD-10-CM

## 2015-12-09 DIAGNOSIS — F1010 Alcohol abuse, uncomplicated: Secondary | ICD-10-CM

## 2015-12-09 DIAGNOSIS — Z720 Tobacco use: Secondary | ICD-10-CM

## 2015-12-09 HISTORY — DX: Angina pectoris, unspecified: I20.9

## 2015-12-09 LAB — CBC WITH DIFFERENTIAL/PLATELET
BASOS PCT: 1 % (ref 0–1)
Basophils Absolute: 0.1 10*3/uL (ref 0.0–0.1)
EOS ABS: 0.3 10*3/uL (ref 0.0–0.7)
Eosinophils Relative: 4 % (ref 0–5)
HCT: 41.3 % (ref 39.0–52.0)
HEMOGLOBIN: 14.6 g/dL (ref 13.0–17.0)
Lymphocytes Relative: 31 % (ref 12–46)
Lymphs Abs: 2.7 10*3/uL (ref 0.7–4.0)
MCH: 31.9 pg (ref 26.0–34.0)
MCHC: 35.4 g/dL (ref 30.0–36.0)
MCV: 90.4 fL (ref 78.0–100.0)
MONOS PCT: 9 % (ref 3–12)
MPV: 10 fL (ref 8.6–12.4)
Monocytes Absolute: 0.8 10*3/uL (ref 0.1–1.0)
NEUTROS ABS: 4.7 10*3/uL (ref 1.7–7.7)
NEUTROS PCT: 55 % (ref 43–77)
PLATELETS: 287 10*3/uL (ref 150–400)
RBC: 4.57 MIL/uL (ref 4.22–5.81)
RDW: 14.1 % (ref 11.5–15.5)
WBC: 8.6 10*3/uL (ref 4.0–10.5)

## 2015-12-09 LAB — PROTIME-INR
INR: 0.95 (ref <1.50)
PROTHROMBIN TIME: 12.8 s (ref 11.6–15.2)

## 2015-12-09 LAB — BASIC METABOLIC PANEL
BUN: 16 mg/dL (ref 7–25)
CALCIUM: 9.4 mg/dL (ref 8.6–10.3)
CO2: 27 mmol/L (ref 20–31)
CREATININE: 1.14 mg/dL (ref 0.60–1.35)
Chloride: 101 mmol/L (ref 98–110)
Glucose, Bld: 102 mg/dL — ABNORMAL HIGH (ref 65–99)
Potassium: 3.8 mmol/L (ref 3.5–5.3)
SODIUM: 138 mmol/L (ref 135–146)

## 2015-12-09 MED ORDER — ASPIRIN EC 81 MG PO TBEC: 81.0000 mg | DELAYED_RELEASE_TABLET | Freq: Every day | ORAL | Status: AC

## 2015-12-09 MED ORDER — NITROGLYCERIN 0.4 MG SL SUBL: 0.4000 mg | SUBLINGUAL_TABLET | SUBLINGUAL | Status: DC | PRN

## 2015-12-09 MED ORDER — ISOSORBIDE MONONITRATE ER 30 MG PO TB24: 30.0000 mg | ORAL_TABLET | Freq: Every day | ORAL | Status: DC

## 2015-12-09 NOTE — Progress Notes (Signed)
HPI:  Chest pain: -intermittent for last few weeks or so -dull ache in chest that occurs with activity and resolves with rest -worst episode was yesterday while walking in store -no symptoms currently -sometimes accompanied by indigestion/belching -denies: SOB, DOE, injury, malaise, indigestion following meals or at night, palpitations, radiation  Obesity/HLD: -chronic -no exercise, poor diet -sig alcohol use -meds: fish oil, statin - misses doses fairly frequently  HTN: -chronic -meds: losartan-hctz 100-25, potassium - reported frequent missed doses in the past and advised to take daily -denies:SOB, DOE, palpitations, swelling  Depression/GAD: -meds: wellbutrin - takes once per day -reports: sees susan bond for CBT -denies: SI, worsening depression -on disability for hx of brain injury  ED: -meds: viagra prn  Alcohol Abuse: -have advised to quit many time - still drinking 1-3 40s several times per week which he reports is a big improvement -reports he quits without withdrawal symptoms for several weeks, but then enjoys it som much goes back to it -lost his license due to drinking -denies: nausea, vomiting, weight loss -LFTs normal 05/2015  R buttock pain: -started about 2 years ago, we thought it may have been related to sitting on a very large wallet -referred to ortho, pars defect - reports doing much better -denies: weakness, numbness, bowel or bladder incontinence  ROS: See pertinent positives and negatives per HPI.  Past Medical History  Diagnosis Date  . Brain injury [facility](HCC)     report on disability for this, sees Dr. Arvilla MarketShwartz  . Hypertension   . Obesity   . Hyperlipemia   . Mood disorder (HCC)     depression and GAD  . Alcohol abuse   . Elevated LFTs   . Erectile dysfunction     No past surgical history on file.  No family history on file.  Social History   Social History  . Marital Status: Single    Spouse Name: N/A  . Number of Children:  N/A  . Years of Education: N/A   Social History Main Topics  . Smoking status: Former Games developermoker  . Smokeless tobacco: None  . Alcohol Use: 0.0 oz/week    0 Standard drinks or equivalent per week  . Drug Use: No  . Sexual Activity: Not Asked   Other Topics Concern  . None   Social History Narrative   Work or School: on disability for brain injury      Home Situation:       Spiritual Beliefs:      Lifestyle: no regular exercise, poor diet           Current outpatient prescriptions:  .  amLODipine (NORVASC) 5 MG tablet, Take 1 tablet (5 mg total) by mouth daily., Disp: 90 tablet, Rfl: 3 .  buPROPion (WELLBUTRIN XL) 300 MG 24 hr tablet, TAKE 1 TABLET EVERY DAY, Disp: 30 tablet, Rfl: 5 .  fish oil-omega-3 fatty acids 1000 MG capsule, Take 1 g by mouth daily., Disp: , Rfl:  .  Ginkgo Biloba (GNP GINGKO BILOBA EXTRACT PO), Take 1 capsule by mouth daily., Disp: , Rfl:  .  losartan-hydrochlorothiazide (HYZAAR) 100-25 MG per tablet, TAKE 1 TABLET BY MOUTH DAILY., Disp: 30 tablet, Rfl: 3 .  meloxicam (MOBIC) 7.5 MG tablet, Take 1 tablet (7.5 mg total) by mouth daily., Disp: 30 tablet, Rfl: 0 .  Misc Natural Products (GINSENG COMPLEX PO), Take 1 tablet by mouth daily., Disp: , Rfl:  .  naproxen (NAPROSYN) 500 MG tablet, Use on a limited basis for exercise and  bad days., Disp: 30 tablet, Rfl: 0 .  potassium chloride (K-DUR) 10 MEQ tablet, Take 1 tablet (10 mEq total) by mouth daily., Disp: 30 tablet, Rfl: 0 .  pravastatin (PRAVACHOL) 20 MG tablet, Take 1 tablet (20 mg total) by mouth daily., Disp: 90 tablet, Rfl: 3 .  sildenafil (VIAGRA) 100 MG tablet, Take 0.5-1 tablets (50-100 mg total) by mouth daily as needed for erectile dysfunction., Disp: 3 tablet, Rfl: 0-  EXAM:  Filed Vitals:   12/09/15 1343  BP: 136/86  Pulse: 109  Temp: 98.4 F (36.9 C)    Body mass index is 50.9 kg/(m^2).  GENERAL: vitals reviewed and listed above, alert, oriented, appears well hydrated and in no acute  distress  HEENT: atraumatic, conjunttiva clear, no obvious abnormalities on inspection of external nose and ears  NECK: no obvious masses on inspection  LUNGS: clear to auscultation bilaterally, no wheezes, rales or rhonchi, good air movement  CV: HRRR, no peripheral edema  MS: moves all extremities without noticeable abnormality  PSYCH: pleasant and cooperative, no obvious depression or anxiety  ASSESSMENT AND PLAN:  Discussed the following assessment and plan: > 40 minutes spent in caring for this patient with > 50% of visit spent face to face in counseling patient.  Chest pain, exertional - Plan: Ambulatory referral to Cardiology -no symptoms today, many risks factors with concerning report, EKG with NSR today - similar to prior -spoke with doc on call with cardiology - they will see pt at their office today, he is to leave here and go straight to their office - address, phone number, directions provided  Hypertension, uncontrolled - Plan: Ambulatory referral to Cardiology  Morbid obesity, unspecified obesity type (HCC) - Plan: Ambulatory referral to Cardiology -lifestyle recs  Hyperlipemia - Plan: Ambulatory referral to Cardiology -advised to take medications every day, wt loss, healthy diet, lifestyle recs - exercise once cardiology evaluation and cleared for exercise  Smoking history - Plan: Ambulatory referral to Cardiology -congratulated again that he has quit  Alcohol abuse -advised to quit, offered help, congratulated on progress, advised of sig life threatening risks of continued use and advise for resources to help  -Patient advised to return or notify a doctor immediately if symptoms worsen or persist or new concerns arise.  Patient Instructions  BEFORE YOU LEAVE: -EKG -schedule follow up in 4 weeks  -We placed a referral for you as discussed to the heart specialist. Please go straight to their offices today when you leave here.  In the interim if you are  having chest pain  please seek emergency care immediately.  -please stop drinking  -take your medications every day         Terressa Koyanagi.

## 2015-12-09 NOTE — Patient Instructions (Addendum)
BEFORE YOU LEAVE: -EKG -schedule follow up in 4 weeks  -We placed a referral for you as discussed to the heart specialist. Please go straight to their offices today when you leave here.  In the interim if you are having chest pain  please seek emergency care immediately.  -please stop drinking  -take your medications every day

## 2015-12-09 NOTE — Progress Notes (Signed)
Pre visit review using our clinic review tool, if applicable. No additional management support is needed unless otherwise documented below in the visit note. 

## 2015-12-09 NOTE — Patient Instructions (Addendum)
Medication Instructions:  Your physician has recommended you make the following change in your medication:  Start aspirin 81 mg by mouth daily. Start Imdur 30 mg daily. Use sublingual nitroglycerin as needed for chest pain.    Labwork: Lab work to be done today-BMP, CBC, PT  Testing/Procedures: Your physician has requested that you have a cardiac catheterization. Cardiac catheterization is used to diagnose and/or treat various heart conditions. Doctors may recommend this procedure for a number of different reasons. The most common reason is to evaluate chest pain. Chest pain can be a symptom of coronary artery disease (CAD), and cardiac catheterization can show whether plaque is narrowing or blocking your heart's arteries. This procedure is also used to evaluate the valves, as well as measure the blood flow and oxygen levels in different parts of your heart. For further information please visit [email or web address] Please follow instruction sheet, as given.  Scheduled for January 11,2017 with Dr. Eldridge Dace    Follow-Up: To be arranged after catheterization.  Any Other Special Instructions Will Be Listed Below (If Applicable).     If you need a refill on your cardiac medications before your next appointment, please call your pharmacy.  Coronary Angiogram A coronary angiogram, also called coronary angiography, is an X-ray procedure used to look at the arteries in the heart. In this procedure, a dye (contrast dye) is injected through a long, hollow tube (catheter). The catheter is about the size of a piece of cooked spaghetti and is inserted through your groin, wrist, or arm. The dye is injected into each artery, and X-rays are then taken to show if there is a blockage in the arteries of your heart. LET [facility] CARE PROVIDER KNOW ABOUT:  Any allergies you have, including allergies to shellfish or contrast dye.   All medicines you are taking, including vitamins, herbs, eye drops,  creams, and over-the-counter medicines.   Previous problems you or members of your family have had with the use of anesthetics.   Any blood disorders you have.   Previous surgeries you have had.  History of kidney problems or failure.   Other medical conditions you have. RISKS AND COMPLICATIONS  Generally, a coronary angiogram is a safe procedure. However, problems can occur and include:  Allergic reaction to the dye.  Bleeding from the access site or other locations.  Kidney injury, especially in people with impaired kidney function.  Stroke (rare).  Heart attack (rare). BEFORE THE PROCEDURE   Do not eat or drink anything after midnight the night before the procedure or as directed by your health care provider.   Ask your health care provider about changing or stopping your regular medicines. This is especially important if you are taking diabetes medicines or blood thinners. PROCEDURE  You may be given a medicine to help you relax (sedative) before the procedure. This medicine is given through an intravenous (IV) access tube that is inserted into one of your veins.   The area where the catheter will be inserted will be washed and shaved. This is usually done in the groin but may be done in the fold of your arm (near your elbow) or in the wrist.   A medicine will be given to numb the area where the catheter will be inserted (local anesthetic).   The health care provider will insert the catheter into an artery. The catheter will be guided by using a special type of X-ray (fluoroscopy) of the blood vessel being examined.   A special dye will  then be injected into the catheter, and X-rays will be taken. The dye will help to show where any narrowing or blockages are located in the heart arteries.  AFTER THE PROCEDURE   If the procedure is done through the leg, you will be kept in bed lying flat for several hours. You will be instructed to not bend or cross your  legs.  The insertion site will be checked frequently.   The pulse in your feet or wrist will be checked frequently.   Additional blood tests, X-rays, and an electrocardiogram may be done.    This information is not intended to replace advice given to you by your health care provider. Make sure you discuss any questions you have with your health care provider.   Document Released: 05/23/2003 Document Revised: 12/07/2014 Document Reviewed: 04/10/2013 Elsevier Interactive Patient Education 2016 Elsevier Inc.    Nitroglycerin sublingual tablets What is this medicine? NITROGLYCERIN (nye troe GLI ser in) is a type of vasodilator. It relaxes blood vessels, increasing the blood and oxygen supply to your heart. This medicine is used to relieve chest pain caused by angina. It is also used to prevent chest pain before activities like climbing stairs, going outdoors in cold weather, or sexual activity. This medicine may be used for other purposes; ask your health care provider or pharmacist if you have questions. What should I tell my health care provider before I take this medicine? They need to know if you have any of these conditions: -anemia -head injury, recent stroke, or bleeding in the brain -liver disease -previous heart attack -an unusual or allergic reaction to nitroglycerin, other medicines, foods, dyes, or preservatives -pregnant or trying to get pregnant -breast-feeding How should I use this medicine? Take this medicine by mouth as needed. At the first sign of an angina attack (chest pain or tightness) place one tablet under your tongue. You can also take this medicine 5 to 10 minutes before an event likely to produce chest pain. Follow the directions on the prescription label. Let the tablet dissolve under the tongue. Do not swallow whole. Replace the dose if you accidentally swallow it. It will help if your mouth is not dry. Saliva around the tablet will help it to dissolve more  quickly. Do not eat or drink, smoke or chew tobacco while a tablet is dissolving. If you are not better within 5 minutes after taking ONE dose of nitroglycerin, call 9-1-1 immediately to seek emergency medical care. Do not take more than 3 nitroglycerin tablets over 15 minutes. If you take this medicine often to relieve symptoms of angina, your doctor or health care professional may provide you with different instructions to manage your symptoms. If symptoms do not go away after following these instructions, it is important to call 9-1-1 immediately. Do not take more than 3 nitroglycerin tablets over 15 minutes. Talk to your pediatrician regarding the use of this medicine in children. Special care may be needed. Overdosage: If you think you have taken too much of this medicine contact a poison control center or emergency room at once. NOTE: This medicine is only for you. Do not share this medicine with others. What if I miss a dose? This does not apply. This medicine is only used as needed. What may interact with this medicine? Do not take this medicine with any of the following medications: -certain migraine medicines like ergotamine and dihydroergotamine (DHE) -medicines used to treat erectile dysfunction like sildenafil, tadalafil, and vardenafil -riociguat This medicine may  also interact with the following medications: -alteplase -aspirin -heparin -medicines for high blood pressure -medicines for mental depression -other medicines used to treat angina -phenothiazines like chlorpromazine, mesoridazine, prochlorperazine, thioridazine This list may not describe all possible interactions. Give your health care provider a list of all the medicines, herbs, non-prescription drugs, or dietary supplements you use. Also tell them if you smoke, drink alcohol, or use illegal drugs. Some items may interact with your medicine. What should I watch for while using this medicine? Tell your doctor or health  care professional if you feel your medicine is no longer working. Keep this medicine with you at all times. Sit or lie down when you take your medicine to prevent falling if you feel dizzy or faint after using it. Try to remain calm. This will help you to feel better faster. If you feel dizzy, take several deep breaths and lie down with your feet propped up, or bend forward with your head resting between your knees. You may get drowsy or dizzy. Do not drive, use machinery, or do anything that needs mental alertness until you know how this drug affects you. Do not stand or sit up quickly, especially if you are an older patient. This reduces the risk of dizzy or fainting spells. Alcohol can make you more drowsy and dizzy. Avoid alcoholic drinks. Do not treat yourself for coughs, colds, or pain while you are taking this medicine without asking your doctor or health care professional for advice. Some ingredients may increase your blood pressure. What side effects may I notice from receiving this medicine? Side effects that you should report to your doctor or health care professional as soon as possible: -blurred vision -dry mouth -skin rash -sweating -the feeling of extreme pressure in the head -unusually weak or tired Side effects that usually do not require medical attention (report to your doctor or health care professional if they continue or are bothersome): -flushing of the face or neck -headache -irregular heartbeat, palpitations -nausea, vomiting This list may not describe all possible side effects. Call your doctor for medical advice about side effects. You may report side effects to FDA at 1-800-FDA-1088. Where should I keep my medicine? Keep out of the reach of children. Store at room temperature between 20 and 25 degrees C (68 and 77 degrees F). Store in Retail buyer. Protect from light and moisture. Keep tightly closed. Throw away any unused medicine after the expiration  date. NOTE: This sheet is a summary. It may not cover all possible information. If you have questions about this medicine, talk to your doctor, pharmacist, or health care provider.    2016, Elsevier/Gold Standard. (2013-09-14 17:57:36)
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
## 2015-12-10 ENCOUNTER — Telehealth: Payer: Self-pay | Admitting: *Deleted

## 2015-12-10 NOTE — Telephone Encounter (Signed)
Pt is aware of his pre-cath labs and we will continue with cath 12/11/15 as scheduled.. Pt verbalized understanding...jw 12/10/15

## 2015-12-10 NOTE — Telephone Encounter (Signed)
-----   Message from Corky CraftsJayadeep S Varanasi, MD sent at 12/10/2015  7:30 AM EST ----- Labs ok for cath

## 2015-12-11 ENCOUNTER — Encounter (HOSPITAL_COMMUNITY)
Admission: RE | Disposition: A | Discharge status: Home / Self Care | Payer: Commercial Managed Care - HMO | Source: Ambulatory Visit | Attending: Interventional Cardiology

## 2015-12-11 ENCOUNTER — Encounter (HOSPITAL_COMMUNITY): Payer: Self-pay | Admitting: Interventional Cardiology

## 2015-12-11 ENCOUNTER — Ambulatory Visit (HOSPITAL_COMMUNITY)
Admission: RE | Admit: 2015-12-11 | Discharge: 2015-12-12 | Disposition: A | Discharge status: Home / Self Care | Payer: Commercial Managed Care - HMO | Source: Ambulatory Visit | Attending: Interventional Cardiology | Admitting: Interventional Cardiology

## 2015-12-11 DIAGNOSIS — I10 Essential (primary) hypertension: Secondary | ICD-10-CM | POA: Diagnosis not present

## 2015-12-11 DIAGNOSIS — R000 Tachycardia, unspecified: Secondary | ICD-10-CM | POA: Insufficient documentation

## 2015-12-11 DIAGNOSIS — Z8249 Family history of ischemic heart disease and other diseases of the circulatory system: Secondary | ICD-10-CM | POA: Insufficient documentation

## 2015-12-11 DIAGNOSIS — I209 Angina pectoris, unspecified: Secondary | ICD-10-CM | POA: Diagnosis present

## 2015-12-11 DIAGNOSIS — F1010 Alcohol abuse, uncomplicated: Secondary | ICD-10-CM | POA: Insufficient documentation

## 2015-12-11 DIAGNOSIS — M25551 Pain in right hip: Secondary | ICD-10-CM | POA: Diagnosis not present

## 2015-12-11 DIAGNOSIS — F411 Generalized anxiety disorder: Secondary | ICD-10-CM | POA: Insufficient documentation

## 2015-12-11 DIAGNOSIS — N529 Male erectile dysfunction, unspecified: Secondary | ICD-10-CM | POA: Diagnosis not present

## 2015-12-11 DIAGNOSIS — F329 Major depressive disorder, single episode, unspecified: Secondary | ICD-10-CM | POA: Diagnosis not present

## 2015-12-11 DIAGNOSIS — Z6841 Body Mass Index (BMI) 40.0 and over, adult: Secondary | ICD-10-CM | POA: Insufficient documentation

## 2015-12-11 DIAGNOSIS — E669 Obesity, unspecified: Secondary | ICD-10-CM | POA: Diagnosis not present

## 2015-12-11 DIAGNOSIS — E785 Hyperlipidemia, unspecified: Secondary | ICD-10-CM | POA: Insufficient documentation

## 2015-12-11 DIAGNOSIS — Z87891 Personal history of nicotine dependence: Secondary | ICD-10-CM | POA: Insufficient documentation

## 2015-12-11 DIAGNOSIS — I25119 Atherosclerotic heart disease of native coronary artery with unspecified angina pectoris: Secondary | ICD-10-CM | POA: Insufficient documentation

## 2015-12-11 DIAGNOSIS — Z87820 Personal history of traumatic brain injury: Secondary | ICD-10-CM | POA: Diagnosis not present

## 2015-12-11 DIAGNOSIS — Z955 Presence of coronary angioplasty implant and graft: Secondary | ICD-10-CM

## 2015-12-11 HISTORY — PX: CARDIAC CATHETERIZATION: SHX172

## 2015-12-11 HISTORY — DX: Rheumatoid arthritis, unspecified: M06.9

## 2015-12-11 HISTORY — DX: Depression, unspecified: F32.A

## 2015-12-11 HISTORY — DX: Atherosclerotic heart disease of native coronary artery without angina pectoris: I25.10

## 2015-12-11 HISTORY — DX: Major depressive disorder, single episode, unspecified: F32.9

## 2015-12-11 LAB — POCT ACTIVATED CLOTTING TIME: ACTIVATED CLOTTING TIME: 523 s

## 2015-12-11 SURGERY — LEFT HEART CATH AND CORONARY ANGIOGRAPHY
Anesthesia: LOCAL

## 2015-12-11 MED ORDER — VERAPAMIL HCL 2.5 MG/ML IV SOLN
INTRAVENOUS | Status: AC
  Filled 2015-12-11: qty 2

## 2015-12-11 MED ORDER — SODIUM CHLORIDE 0.9 % IJ SOLN
3.0000 mL | INTRAMUSCULAR | Status: DC | PRN
Start: 2015-12-11 — End: 2015-12-12

## 2015-12-11 MED ORDER — SODIUM CHLORIDE 0.9 % WEIGHT BASED INFUSION
1.0000 mL/kg/h | INTRAVENOUS | Status: DC
  Administered 2015-12-11 (×3): 1 mL/kg/h via INTRAVENOUS

## 2015-12-11 MED ORDER — BIVALIRUDIN 250 MG IV SOLR
INTRAVENOUS | Status: AC
  Filled 2015-12-11: qty 250

## 2015-12-11 MED ORDER — NITROGLYCERIN 1 MG/10 ML FOR IR/CATH LAB
INTRA_ARTERIAL | Status: DC | PRN
  Administered 2015-12-11 (×2): 200 ug via INTRACORONARY

## 2015-12-11 MED ORDER — IOHEXOL 350 MG/ML SOLN
INTRAVENOUS | Status: DC | PRN
  Administered 2015-12-11: 230 mL via INTRA_ARTERIAL

## 2015-12-11 MED ORDER — ONDANSETRON HCL 4 MG/2ML IJ SOLN
4.0000 mg | Freq: Four times a day (QID) | INTRAMUSCULAR | Status: DC | PRN
Start: 2015-12-11 — End: 2015-12-12

## 2015-12-11 MED ORDER — NITROGLYCERIN 1 MG/10 ML FOR IR/CATH LAB: INTRA_ARTERIAL | Status: DC | PRN

## 2015-12-11 MED ORDER — LOSARTAN POTASSIUM 50 MG PO TABS
100.0000 mg | ORAL_TABLET | Freq: Every day | ORAL | Status: DC
  Administered 2015-12-12: 100 mg via ORAL
  Filled 2015-12-11: qty 2

## 2015-12-11 MED ORDER — CLOPIDOGREL BISULFATE 300 MG PO TABS
ORAL_TABLET | ORAL | Status: DC | PRN
  Administered 2015-12-11: 600 mg via ORAL

## 2015-12-11 MED ORDER — SODIUM CHLORIDE 0.9 % WEIGHT BASED INFUSION: 1.0000 mL/kg/h | INTRAVENOUS | Status: AC

## 2015-12-11 MED ORDER — HEPARIN (PORCINE) IN NACL 2-0.9 UNIT/ML-% IJ SOLN
INTRAMUSCULAR | Status: AC
Start: 2015-12-11 — End: 2015-12-11
  Filled 2015-12-11: qty 1000

## 2015-12-11 MED ORDER — HYDROCHLOROTHIAZIDE 25 MG PO TABS
25.0000 mg | ORAL_TABLET | Freq: Every day | ORAL | Status: DC
  Administered 2015-12-12: 25 mg via ORAL
  Filled 2015-12-11: qty 1

## 2015-12-11 MED ORDER — SODIUM CHLORIDE 0.9 % IV SOLN: 250.0000 mL | INTRAVENOUS | Status: DC | PRN

## 2015-12-11 MED ORDER — ACETAMINOPHEN 325 MG PO TABS: 650.0000 mg | ORAL_TABLET | ORAL | Status: DC | PRN

## 2015-12-11 MED ORDER — SODIUM CHLORIDE 0.9 % IV SOLN
250.0000 mg | INTRAVENOUS | Status: DC | PRN
  Administered 2015-12-11: 1.75 mg/kg/h via INTRAVENOUS

## 2015-12-11 MED ORDER — ASPIRIN 81 MG PO CHEW: 81.0000 mg | CHEWABLE_TABLET | ORAL | Status: DC

## 2015-12-11 MED ORDER — ISOSORBIDE MONONITRATE ER 30 MG PO TB24
30.0000 mg | ORAL_TABLET | Freq: Every day | ORAL | Status: DC
  Administered 2015-12-11 – 2015-12-12 (×2): 30 mg via ORAL
  Filled 2015-12-11 (×2): qty 1

## 2015-12-11 MED ORDER — BIVALIRUDIN BOLUS VIA INFUSION - CUPID
INTRAVENOUS | Status: DC | PRN
  Administered 2015-12-11: 112.275 mg via INTRAVENOUS

## 2015-12-11 MED ORDER — SODIUM CHLORIDE 0.9 % IV SOLN
1.7500 mg/kg/h | INTRAVENOUS | Status: AC
  Filled 2015-12-11 (×2): qty 250

## 2015-12-11 MED ORDER — PRAVASTATIN SODIUM 20 MG PO TABS
20.0000 mg | ORAL_TABLET | Freq: Every day | ORAL | Status: DC
  Administered 2015-12-12: 20 mg via ORAL
  Filled 2015-12-11: qty 1

## 2015-12-11 MED ORDER — NITROGLYCERIN 1 MG/10 ML FOR IR/CATH LAB
INTRA_ARTERIAL | Status: AC
  Filled 2015-12-11: qty 10

## 2015-12-11 MED ORDER — SODIUM CHLORIDE 0.9 % IJ SOLN: 3.0000 mL | Freq: Two times a day (BID) | INTRAMUSCULAR | Status: DC

## 2015-12-11 MED ORDER — VERAPAMIL HCL 2.5 MG/ML IV SOLN
INTRAVENOUS | Status: DC | PRN
  Administered 2015-12-11: 09:00:00 via INTRA_ARTERIAL

## 2015-12-11 MED ORDER — LIDOCAINE HCL (PF) 1 % IJ SOLN
INTRAMUSCULAR | Status: AC
  Filled 2015-12-11: qty 30

## 2015-12-11 MED ORDER — SODIUM CHLORIDE 0.9 % IJ SOLN
3.0000 mL | Freq: Two times a day (BID) | INTRAMUSCULAR | Status: DC
  Administered 2015-12-11: 20:00:00 3 mL via INTRAVENOUS

## 2015-12-11 MED ORDER — BUPROPION HCL ER (XL) 300 MG PO TB24
300.0000 mg | ORAL_TABLET | Freq: Every day | ORAL | Status: DC
  Administered 2015-12-12: 300 mg via ORAL
  Filled 2015-12-11 (×2): qty 1

## 2015-12-11 MED ORDER — OMEGA-3-ACID ETHYL ESTERS 1 G PO CAPS
1.0000 g | ORAL_CAPSULE | Freq: Every day | ORAL | Status: DC
  Administered 2015-12-12: 1 g via ORAL
  Filled 2015-12-11 (×2): qty 1

## 2015-12-11 MED ORDER — CLOPIDOGREL BISULFATE 300 MG PO TABS
ORAL_TABLET | ORAL | Status: AC
  Filled 2015-12-11: qty 2

## 2015-12-11 MED ORDER — ASPIRIN EC 81 MG PO TBEC
81.0000 mg | DELAYED_RELEASE_TABLET | Freq: Every day | ORAL | Status: DC
  Administered 2015-12-12: 09:00:00 81 mg via ORAL
  Filled 2015-12-11: qty 1

## 2015-12-11 MED ORDER — LOSARTAN POTASSIUM-HCTZ 100-25 MG PO TABS: 1.0000 | ORAL_TABLET | Freq: Every day | ORAL | Status: DC

## 2015-12-11 MED ORDER — NITROGLYCERIN 0.4 MG SL SUBL: 0.4000 mg | SUBLINGUAL_TABLET | SUBLINGUAL | Status: DC | PRN

## 2015-12-11 MED ORDER — FENTANYL CITRATE (PF) 100 MCG/2ML IJ SOLN
INTRAMUSCULAR | Status: AC
  Filled 2015-12-11: qty 2

## 2015-12-11 MED ORDER — HEPARIN SODIUM (PORCINE) 1000 UNIT/ML IJ SOLN
INTRAMUSCULAR | Status: DC | PRN
  Administered 2015-12-11: 6000 [IU] via INTRAVENOUS

## 2015-12-11 MED ORDER — AMLODIPINE BESYLATE 5 MG PO TABS
5.0000 mg | ORAL_TABLET | Freq: Every day | ORAL | Status: DC
  Administered 2015-12-12: 09:00:00 5 mg via ORAL
  Filled 2015-12-11: qty 1

## 2015-12-11 MED ORDER — SODIUM CHLORIDE 0.9 % WEIGHT BASED INFUSION
3.0000 mL/kg/h | INTRAVENOUS | Status: DC
  Administered 2015-12-11 (×2): 3 mL/kg/h via INTRAVENOUS

## 2015-12-11 MED ORDER — NITROGLYCERIN 1 MG/10 ML FOR IR/CATH LAB
INTRA_ARTERIAL | Status: DC | PRN
  Administered 2015-12-11: 10:00:00

## 2015-12-11 MED ORDER — MIDAZOLAM HCL 2 MG/2ML IJ SOLN
INTRAMUSCULAR | Status: AC
  Filled 2015-12-11: qty 2

## 2015-12-11 MED ORDER — FENTANYL CITRATE (PF) 100 MCG/2ML IJ SOLN
INTRAMUSCULAR | Status: DC | PRN
  Administered 2015-12-11 (×2): 25 ug via INTRAVENOUS
  Administered 2015-12-11: 50 ug via INTRAVENOUS
  Administered 2015-12-11: 25 ug via INTRAVENOUS
  Administered 2015-12-11: 50 ug via INTRAVENOUS

## 2015-12-11 MED ORDER — ASPIRIN 81 MG PO CHEW: 81.0000 mg | CHEWABLE_TABLET | Freq: Every day | ORAL | Status: DC

## 2015-12-11 MED ORDER — HEPARIN SODIUM (PORCINE) 1000 UNIT/ML IJ SOLN
INTRAMUSCULAR | Status: AC
  Filled 2015-12-11: qty 1

## 2015-12-11 MED ORDER — ANGIOPLASTY BOOK
Freq: Once | Status: AC
  Administered 2015-12-11: 22:00:00
  Filled 2015-12-11: qty 1

## 2015-12-11 MED ORDER — LORAZEPAM 2 MG/ML IJ SOLN: 1.0000 mg | INTRAMUSCULAR | Status: DC | PRN

## 2015-12-11 MED ORDER — CLOPIDOGREL BISULFATE 75 MG PO TABS
75.0000 mg | ORAL_TABLET | Freq: Every day | ORAL | Status: DC
  Administered 2015-12-12: 75 mg via ORAL
  Filled 2015-12-11: qty 1

## 2015-12-11 MED ORDER — MIDAZOLAM HCL 2 MG/2ML IJ SOLN
INTRAMUSCULAR | Status: DC | PRN
  Administered 2015-12-11 (×3): 1 mg via INTRAVENOUS
  Administered 2015-12-11: 2 mg via INTRAVENOUS

## 2015-12-11 MED ORDER — ZOLPIDEM TARTRATE 5 MG PO TABS
10.0000 mg | ORAL_TABLET | Freq: Every evening | ORAL | Status: DC | PRN
  Administered 2015-12-11: 22:00:00 10 mg via ORAL
  Filled 2015-12-11: qty 2

## 2015-12-11 MED ORDER — SODIUM CHLORIDE 0.9 % IJ SOLN: 3.0000 mL | INTRAMUSCULAR | Status: DC | PRN

## 2015-12-11 SURGICAL SUPPLY — 24 items
BALLN EMERGE MR 2.5X15 (BALLOONS) ×2
BALLN ~~LOC~~ EMERGE MR 3.25X20 (BALLOONS) ×2
BALLN ~~LOC~~ EMERGE MR 3.75X12 (BALLOONS) ×2
BALLOON EMERGE MR 2.5X15 (BALLOONS) IMPLANT
BALLOON ~~LOC~~ EMERGE MR 3.25X20 (BALLOONS) IMPLANT
BALLOON ~~LOC~~ EMERGE MR 3.75X12 (BALLOONS) IMPLANT
CATH INFINITI 5FR ANG PIGTAIL (CATHETERS) ×2 IMPLANT
CATH INFINITI JR4 5F (CATHETERS) ×2 IMPLANT
DEVICE RAD COMP TR BAND LRG (VASCULAR PRODUCTS) ×2 IMPLANT
GLIDESHEATH SLEND SS 6F .021 (SHEATH) ×2 IMPLANT
GUIDE CATH RUNWAY 6FR CLS3 (CATHETERS) ×1 IMPLANT
KIT ENCORE 26 ADVANTAGE (KITS) ×1 IMPLANT
KIT HEART LEFT (KITS) ×2 IMPLANT
PACK CARDIAC CATHETERIZATION (CUSTOM PROCEDURE TRAY) ×2 IMPLANT
STENT SYNERGY DES 3.5X16 (Permanent Stent) ×1 IMPLANT
STENT SYNERGY DES 3X24 (Permanent Stent) ×1 IMPLANT
STENT SYNERGY DES 4X8 (Permanent Stent) ×1 IMPLANT
SYR MEDRAD MARK V 150ML (SYRINGE) ×2 IMPLANT
TRANSDUCER W/STOPCOCK (MISCELLANEOUS) ×2 IMPLANT
TUBING CIL FLEX 10 FLL-RA (TUBING) ×2 IMPLANT
VALVE GUARDIAN II ~~LOC~~ HEMO (MISCELLANEOUS) ×1 IMPLANT
WIRE ASAHI PROWATER 180CM (WIRE) ×1 IMPLANT
WIRE HI TORQ BMW 190CM (WIRE) ×1 IMPLANT
WIRE SAFE-T 1.5MM-J .035X260CM (WIRE) ×2 IMPLANT
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
## 2015-12-11 NOTE — Research (Signed)
[place] Informed Consent   Subject Name: Logan Sanchez  Subject met inclusion and exclusion criteria.  The informed consent form, study requirements and expectations were reviewed with the subject and questions and concerns were addressed prior to the signing of the consent form.  The subject verbalized understanding of the trial requirements.  The subject agreed to participate in the [place] trial and signed the informed consent.  The informed consent was obtained prior to performance of any protocol-specific procedures for the subject.  A copy of the signed informed consent was given to the subject and a copy was placed in the subject's medical record.  Marlana Salvage 12/11/2015, 07:30

## 2015-12-11 NOTE — Progress Notes (Signed)
TR BAND REMOVAL  LOCATION:    right radial  DEFLATED PER PROTOCOL:    Yes.    TIME BAND OFF / DRESSING APPLIED:    1700   SITE UPON ARRIVAL:    Level 0  SITE AFTER BAND REMOVAL:    Level 0  CIRCULATION SENSATION AND MOVEMENT:    Within Normal Limits   Yes.    COMMENTS:   Rechecked at 1730 and frequently remainder of shift with no change in assessment

## 2015-12-11 NOTE — Interval H&P Note (Signed)
Cath Lab Visit (complete for each Cath Lab visit)  Clinical Evaluation Leading to the Procedure:   ACS: No.  Non-ACS:    Anginal Classification: CCS III  Anti-ischemic medical therapy: Maximal Therapy (2 or more classes of medications)  Non-Invasive Test Results: No non-invasive testing performed  Prior CABG: No previous CABG      History and Physical Interval Note:  12/11/2015 8:41 AM  Logan Sanchez  has presented today for surgery, with the diagnosis of c/p  The various methods of treatment have been discussed with the patient and family. After consideration of risks, benefits and other options for treatment, the patient has consented to  Procedure(s): Left Heart Cath and Coronary Angiography (N/A) as a surgical intervention .  The patient's history has been reviewed, patient examined, no change in status, stable for surgery.  I have reviewed the patient's chart and labs.  Questions were answered to the patient's satisfaction.     Hazley Dezeeuw S.

## 2015-12-12 ENCOUNTER — Encounter (HOSPITAL_COMMUNITY): Payer: Self-pay | Admitting: Physician Assistant

## 2015-12-12 ENCOUNTER — Telehealth: Payer: Self-pay

## 2015-12-12 DIAGNOSIS — F411 Generalized anxiety disorder: Secondary | ICD-10-CM | POA: Diagnosis not present

## 2015-12-12 DIAGNOSIS — I25119 Atherosclerotic heart disease of native coronary artery with unspecified angina pectoris: Secondary | ICD-10-CM | POA: Diagnosis not present

## 2015-12-12 DIAGNOSIS — E669 Obesity, unspecified: Secondary | ICD-10-CM | POA: Diagnosis not present

## 2015-12-12 DIAGNOSIS — Z87820 Personal history of traumatic brain injury: Secondary | ICD-10-CM | POA: Diagnosis not present

## 2015-12-12 DIAGNOSIS — E785 Hyperlipidemia, unspecified: Secondary | ICD-10-CM | POA: Diagnosis not present

## 2015-12-12 DIAGNOSIS — N529 Male erectile dysfunction, unspecified: Secondary | ICD-10-CM | POA: Diagnosis not present

## 2015-12-12 DIAGNOSIS — F329 Major depressive disorder, single episode, unspecified: Secondary | ICD-10-CM | POA: Diagnosis not present

## 2015-12-12 DIAGNOSIS — I209 Angina pectoris, unspecified: Secondary | ICD-10-CM | POA: Diagnosis not present

## 2015-12-12 DIAGNOSIS — I10 Essential (primary) hypertension: Secondary | ICD-10-CM | POA: Diagnosis not present

## 2015-12-12 DIAGNOSIS — F1010 Alcohol abuse, uncomplicated: Secondary | ICD-10-CM | POA: Diagnosis not present

## 2015-12-12 LAB — CBC
HCT: 41.5 % (ref 39.0–52.0)
HEMOGLOBIN: 13.8 g/dL (ref 13.0–17.0)
MCH: 31.1 pg (ref 26.0–34.0)
MCHC: 33.3 g/dL (ref 30.0–36.0)
MCV: 93.5 fL (ref 78.0–100.0)
PLATELETS: 265 10*3/uL (ref 150–400)
RBC: 4.44 MIL/uL (ref 4.22–5.81)
RDW: 13.3 % (ref 11.5–15.5)
WBC: 9.5 10*3/uL (ref 4.0–10.5)

## 2015-12-12 LAB — BASIC METABOLIC PANEL
ANION GAP: 7 (ref 5–15)
BUN: 13 mg/dL (ref 6–20)
CHLORIDE: 102 mmol/L (ref 101–111)
CO2: 29 mmol/L (ref 22–32)
Calcium: 8.7 mg/dL — ABNORMAL LOW (ref 8.9–10.3)
Creatinine, Ser: 1.12 mg/dL (ref 0.61–1.24)
Glucose, Bld: 116 mg/dL — ABNORMAL HIGH (ref 65–99)
POTASSIUM: 3.6 mmol/L (ref 3.5–5.1)
SODIUM: 138 mmol/L (ref 135–145)

## 2015-12-12 MED ORDER — METOPROLOL TARTRATE 12.5 MG HALF TABLET
12.5000 mg | ORAL_TABLET | Freq: Two times a day (BID) | ORAL | Status: DC
  Administered 2015-12-12: 09:00:00 12.5 mg via ORAL
  Filled 2015-12-12: qty 1

## 2015-12-12 MED ORDER — CLOPIDOGREL BISULFATE 75 MG PO TABS: 75.0000 mg | ORAL_TABLET | Freq: Every day | ORAL | Status: DC

## 2015-12-12 MED ORDER — PANTOPRAZOLE SODIUM 40 MG PO TBEC
40.0000 mg | DELAYED_RELEASE_TABLET | Freq: Every day | ORAL | Status: DC
  Administered 2015-12-12: 40 mg via ORAL
  Filled 2015-12-12: qty 1

## 2015-12-12 MED ORDER — PANTOPRAZOLE SODIUM 40 MG PO TBEC: 40.0000 mg | DELAYED_RELEASE_TABLET | Freq: Every day | ORAL | Status: DC

## 2015-12-12 MED ORDER — METOPROLOL TARTRATE 25 MG PO TABS: 12.5000 mg | ORAL_TABLET | Freq: Two times a day (BID) | ORAL | Status: DC

## 2015-12-12 MED FILL — Bivalirudin For IV Soln 250 MG: INTRAVENOUS | Qty: 250 | Status: AC

## 2015-12-12 MED FILL — Sodium Chloride IV Soln 0.9%: INTRAVENOUS | Qty: 50 | Status: AC

## 2015-12-12 NOTE — Progress Notes (Signed)
Patient Name: Logan Sanchez Date of Encounter: 12/12/2015   SUBJECTIVE  Feeling well. No chest pain, sob or palpitations.   CURRENT MEDS . amLODipine  5 mg Oral Daily  . aspirin EC  81 mg Oral Daily  . buPROPion  300 mg Oral Daily  . clopidogrel  75 mg Oral Q breakfast  . losartan  100 mg Oral Daily   And  . hydrochlorothiazide  25 mg Oral Daily  . isosorbide mononitrate  30 mg Oral Daily  . omega-3 acid ethyl esters  1 g Oral Daily  . pravastatin  20 mg Oral Daily  . sodium chloride  3 mL Intravenous Q12H    OBJECTIVE  Filed Vitals:   12/11/15 1100 12/11/15 1646 12/11/15 2001 12/12/15 0321  BP: 134/91 134/98 126/77 114/60  Pulse: 106 99 99 106  Temp: 98 F (36.7 C) 98 F (36.7 C) 98.2 F (36.8 C) 98.2 F (36.8 C)  TempSrc: Oral Oral Oral Oral  Resp: 23 23 24 17   Height:      Weight:    319 lb 7.1 oz (144.9 kg)  SpO2: 95% 97% 95% 93%    Intake/Output Summary (Last 24 hours) at 12/12/15 0802 Last data filed at 12/11/15 2004  Gross per 24 hour  Intake   1320 ml  Output    902 ml  Net    418 ml   Filed Weights   12/11/15 0626 12/12/15 0321  Weight: 330 lb (149.687 kg) 319 lb 7.1 oz (144.9 kg)    PHYSICAL EXAM  General: Pleasant, NAD. Neuro: Alert and oriented X 3. Moves all extremities spontaneously. Psych: Normal affect. HEENT:  Normal  Neck: Supple without bruits or JVD. Lungs:  Resp regular and unlabored, CTA. Heart: RRR no s3, s4, or murmurs. Abdomen: Soft, non-tender, non-distended, BS + x 4.  Extremities: No clubbing, cyanosis or edema. DP/PT/Radials 2+ and equal bilaterally. R cath site without hematoma.   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 Small area of dissection, with normal flow.  Mid LAD lesion, 99% stenosed. Post intervention with overlapping drug-eluting stents, there is a 0% residual stenosis.  The left ventricular systolic function is normal.  Mid Cx lesion, 80% stenosed. Post intervention with a 3.0 x 24 Synergy drug-eluting stent, there is a 0% residual stenosis.  Continue dual antiplatelet therapy for at least a year and perhaps beyond given his diffuse coronary disease. I suspect his angina will be improved. We'll consider bringing him back for further evaluation of the proximal right coronary lesion which appears to have some spontaneous dissection. Watch the patient overnight. Anticipate discharge tomorrow.  He'll need aggressive risk factor modification including weight loss, lipid-lowering therapy and reduction of his alcohol consumption. I spoke at length with the patient's father and he was most concerned about the alcohol consumption. We'll discuss this with the patient.    ASSESSMENT AND PLAN  1. Angina pectoris (HCC)  - Cath showed 99% stenosed mild LAD s/p overlapping drug-eluting stents; mid  Cx 80% stenosed s/p Synergy drug-eluting stent. - Continue DAPT for at least year, could me more for diffuse coronary disease. We'll consider bringing him back for further evaluation of the proximal right coronary lesion which appears to have some spontaneous dissection.  - Post cath labs stable.   2. Sinus tachycardia - Rate mostly in 90-100s. Intermittently goes to 130-140s. Not on BB. Will start metoprolol 12.5mg  BID.   3. Right hip pain/ Pars defect - The patient takes ibuprofen about 1gm./day with and without tylenol. Will start protonix to prevent GI bleed as now he is on DAPA. - F/u with PCP/ortho to discuss other pain management options.   Signed, Logan Sanchez,Bhavinkumar PA-C Pager [phone]  Agree with note by Logan AusVin Bhagat  PA-C  S/P PCI/Stent LAD/LCX with DES. Residual mod RCA Dz (70%). Nl LV fxn. R radial OK. Labs OK. DC home on DAPT. CRF mod. ROV with Logan Sanchez.  Logan Sanchez, M.D., FACP, [facility]FACC, Logan Sanchez, [facility]SCAI [facility] [address] [place], [place]  [id number]  [phone] 12/12/2015 8:26 AM

## 2015-12-12 NOTE — Progress Notes (Signed)
[id number] Did not walk with pt as heart rate to 123 sitting in bed. RN to walk later after meds given. Spent a great deal of time with pt discussing heart healthy changes needed. Pt stated he drinks about a pint a day. We discussed that he has to decrease this and pt stated will have to do slowly. Tried to get pt to cut down to as low as shots a day as he can do and to make a goal of keeping to this. Pt was able to quit smoking over a year ago by decreasing slowly. We discussed in detail heart healthy food choices to help him lose weight. He will also be referred to CRP 2 in [place]. Pt encouraged to walk for exercise and pt started out slowly due to heart rate but encouraged to get to at least 30 minutes 5 or 6 days a week. Stressed importance of plavix. Dr Allyson SabalBerry in and also encouraged pt to make these changes. Pt stated he wants to change but he will need support and CRP [facility] would be a great resource for him. Luetta NuttingCharlene Daruis Swaim RN BSN 12/12/2015 9:04 AM
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
## 2015-12-12 NOTE — Telephone Encounter (Signed)
Transition Care Management Follow-up Telephone Call  How have you been since you were released from the hospital? Feeling all right    Do you understand why you were in the hospital? yes   Do you understand the discharge instrcutions? yes  Items Reviewed:  Medications reviewed: yes  Allergies reviewed: yes  Dietary changes reviewed: yes  Referrals reviewed: yes   Functional Questionnaire:   Activities of Daily Living (ADLs):   He states they are independent in the following: ambulation, bathing and hygiene, feeding, continence, grooming, toileting and dressing States they require assistance with the following: None   Any transportation issues/concerns?: no   Any patient concerns? no   Confirmed importance and date/time of follow-up visits scheduled: yes   Confirmed with patient if condition begins to worsen call PCP or go to the ER.  Patient was given the Call-a-Nurse line [phone]: yes  Pt has a hospital follow up 12/17/15 at 3:00 pm

## 2015-12-17 ENCOUNTER — Ambulatory Visit (INDEPENDENT_AMBULATORY_CARE_PROVIDER_SITE_OTHER): Payer: Commercial Managed Care - HMO | Admitting: Family Medicine

## 2015-12-17 ENCOUNTER — Encounter: Payer: Self-pay | Admitting: Family Medicine

## 2015-12-17 VITALS — BP 102/72 | HR 100 | Temp 98.9°F | Ht 68.0 in | Wt 312.6 lb

## 2015-12-17 DIAGNOSIS — I10 Essential (primary) hypertension: Secondary | ICD-10-CM

## 2015-12-17 DIAGNOSIS — Z789 Other specified health status: Secondary | ICD-10-CM | POA: Diagnosis not present

## 2015-12-17 DIAGNOSIS — I209 Angina pectoris, unspecified: Secondary | ICD-10-CM

## 2015-12-17 DIAGNOSIS — E785 Hyperlipidemia, unspecified: Secondary | ICD-10-CM

## 2015-12-17 DIAGNOSIS — Z7289 Other problems related to lifestyle: Secondary | ICD-10-CM

## 2015-12-17 NOTE — Progress Notes (Signed)
HPI:  Logan Sanchez is a 69 M with a history of morbid obesity, alcohol abuse, prior tobacco use, HTN, HLD here for a transitional care visit following a recent hospitalization. He had reported to me last week that he was having belching with activity. I sent him for an urgent cardiology evaluation with concern for CAD. He was hospitalized 1/11 to 12/12/15 for a cath with multivessel disease and now is s/p DESs to the LAD and Mid Cx. He is on dual antiplatelet therapy, statin, norvasc, imdur, hyzaar, metoprolol and potassium. Reports he is feeling fine without CP, SOB, DOE or pain. Reports he is already making changes with his diet and has lost some weight. Reports he has follow up for nutrition and exercise counseling. Reports is taking all medications as prescribed and has stopped drinking.  ROS: See pertinent positives and negatives per HPI.  Past Medical History  Diagnosis Date  . Brain injury [facility])     report on disability for this, sees Dr. Arvilla Market  . Hypertension   . Obesity   . Hyperlipemia   . Mood disorder (HCC)     depression and GAD  . Alcohol abuse   . Elevated LFTs   . Erectile dysfunction   . Coronary artery disease     a. cath 12/11/15 99% stenosed mild LAD s/p overlapping drug-eluting stents; mid Cx 80% stenosed s/p Synergy drug-eluting stent; pro RCA 70% - medical managment for now, could consider cath  . Rheumatoid arthritis involving right hip (HCC)   . Depression     Past Surgical History  Procedure Laterality Date  . Cardiac catheterization N/A 12/11/2015    Procedure: Left Heart Cath and Coronary Angiography;  Surgeon: Corky Crafts, MD;  Location: [facility] INVASIVE CV LAB;  Service: Cardiovascular;  Laterality: N/A;  . Cardiac catheterization N/A 12/11/2015    Procedure: Coronary Stent Intervention;  Surgeon: Corky Crafts, MD;  Location: [facility] INVASIVE CV LAB;  Service: Cardiovascular;  Laterality: N/A;  . Coronary angioplasty    . Forearm fracture surgery  Right ~ 1998    "I've got a metal rod in it"  . Fracture surgery      Family History  Problem Relation Age of Onset  . Heart attack Maternal Grandmother   . Heart attack Maternal Grandfather   . Hypertension Mother   . Hypertension Father   . Hypertension Maternal Grandmother   . Hypertension Maternal Grandfather   . Hypertension Paternal Grandmother   . Hypertension Paternal Grandfather   . Stroke Maternal Grandmother     Social History   Social History  . Marital Status: Single    Spouse Name: N/A  . Number of Children: N/A  . Years of Education: N/A   Social History Main Topics  . Smoking status: Former Smoker -- 1.50 packs/day for 25 years    Types: Cigarettes    Quit date: 09/30/2014  . Smokeless tobacco: Never Used  . Alcohol Use: 45.6 oz/week    0 Standard drinks or equivalent, 23 Cans of beer, 53 Shots of liquor per week     Comment: 12/11/2015 "I drink at least 1 40oz beer/day plus a pint of liquor 5 times/week"  . Drug Use: No  . Sexual Activity: Not Currently   Other Topics Concern  . None   Social History Narrative   Work or School: on disability for brain injury      Home Situation:       Spiritual Beliefs:  Lifestyle: no regular exercise, poor diet           Current outpatient prescriptions:  .  amLODipine (NORVASC) 5 MG tablet, Take 1 tablet (5 mg total) by mouth daily., Disp: 90 tablet, Rfl: 3 .  aspirin EC 81 MG tablet, Take 1 tablet (81 mg total) by mouth daily., Disp: 90 tablet, Rfl: 3 .  buPROPion (WELLBUTRIN XL) 300 MG 24 hr tablet, TAKE 1 TABLET EVERY DAY, Disp: 30 tablet, Rfl: 5 .  clopidogrel (PLAVIX) 75 MG tablet, Take 1 tablet (75 mg total) by mouth daily with breakfast., Disp: 30 tablet, Rfl: 11 .  fish oil-omega-3 fatty acids 1000 MG capsule, Take 1 g by mouth daily., Disp: , Rfl:  .  Ginkgo Biloba (GNP GINGKO BILOBA EXTRACT PO), Take 1 capsule by mouth daily., Disp: , Rfl:  .  isosorbide mononitrate (IMDUR) 30 MG 24 hr  tablet, Take 1 tablet (30 mg total) by mouth daily., Disp: 30 tablet, Rfl: 11 .  losartan-hydrochlorothiazide (HYZAAR) 100-25 MG per tablet, TAKE 1 TABLET BY MOUTH DAILY., Disp: 30 tablet, Rfl: 3 .  metoprolol tartrate (LOPRESSOR) 25 MG tablet, Take 0.5 tablets (12.5 mg total) by mouth 2 (two) times daily., Disp: 30 tablet, Rfl: 6 .  Misc Natural Products (GINSENG COMPLEX PO), Take 1 tablet by mouth daily., Disp: , Rfl:  .  nitroGLYCERIN (NITROSTAT) 0.4 MG SL tablet, Place 1 tablet (0.4 mg total) under the tongue every 5 (five) minutes as needed for chest pain., Disp: 25 tablet, Rfl: 6 .  pantoprazole (PROTONIX) 40 MG tablet, Take 1 tablet (40 mg total) by mouth daily., Disp: 30 tablet, Rfl: 11 .  potassium chloride (K-DUR) 10 MEQ tablet, Take 1 tablet (10 mEq total) by mouth daily., Disp: 30 tablet, Rfl: 0 .  pravastatin (PRAVACHOL) 20 MG tablet, Take 1 tablet (20 mg total) by mouth daily., Disp: 90 tablet, Rfl: 3  EXAM:  Filed Vitals:   12/17/15 1455  BP: 102/72  Pulse: 100  Temp: 98.9 F (37.2 C)    Body mass index is 47.54 kg/(m^2).  GENERAL: vitals reviewed and listed above, alert, oriented, appears well hydrated and in no acute distress  HEENT: atraumatic, conjunttiva clear, no obvious abnormalities on inspection of external nose and ears  NECK: no obvious masses on inspection  LUNGS: clear to auscultation bilaterally, no wheezes, rales or rhonchi, good air movement  CV: HRRR, no peripheral edema  MS: moves all extremities without noticeable abnormality  PSYCH: pleasant and cooperative, no obvious depression or anxiety  ASSESSMENT AND PLAN:  Discussed the following assessment and plan:  Angina pectoris (HCC)  Morbid obesity, unspecified obesity type (HCC)  Hyperlipidemia  Hypertension, uncontrolled  Alcohol use (HCC)  -follow up with cardiologist as planned and advised likely will need to increase pravastatin or change statin -lengthy discussion on importance  of lifestyle changes  -advised strict alcohol avoidance and resources to help and to resources if any withdrawal -follow up in 3 months with FASTING lipids and as needed -Patient advised to return or notify a doctor immediately if symptoms worsen or persist or new concerns arise.  Patient Instructions  BEFORE YOU LEAVE: -follow up in 3 months, come fasting to check cholesterol if not done with cardiology  NO more alcohol.  We recommend the following healthy lifestyle measures: - eat a healthy whole foods diet consisting of regular small meals composed of vegetables, fruits, beans, nuts, seeds, healthy meats such as white chicken and fish and whole grains.  - avoid  sweets, white starchy foods, fried foods, fast food, processed foods, sodas, red meet and other fattening foods.  - Once cleared by cardiologist - regular exercise will also be very importanct  Take medications EVERY day as instructed      KIM, HANNAH R.

## 2015-12-17 NOTE — Patient Instructions (Signed)
BEFORE YOU LEAVE: -follow up in 3 months, come fasting to check cholesterol if not done with cardiology  NO more alcohol.  We recommend the following healthy lifestyle measures: - eat a healthy whole foods diet consisting of regular small meals composed of vegetables, fruits, beans, nuts, seeds, healthy meats such as white chicken and fish and whole grains.  - avoid sweets, white starchy foods, fried foods, fast food, processed foods, sodas, red meet and other fattening foods.  - Once cleared by cardiologist - regular exercise will also be very importanct  Take medications EVERY day as instructed

## 2015-12-17 NOTE — Progress Notes (Signed)
Pre visit review using our clinic review tool, if applicable. No additional management support is needed unless otherwise documented below in the visit note. 

## 2015-12-24 ENCOUNTER — Ambulatory Visit (INDEPENDENT_AMBULATORY_CARE_PROVIDER_SITE_OTHER): Payer: Medicare HMO | Admitting: Licensed Clinical Social Worker

## 2015-12-24 ENCOUNTER — Other Ambulatory Visit: Payer: Self-pay | Admitting: Family

## 2015-12-24 DIAGNOSIS — F332 Major depressive disorder, recurrent severe without psychotic features: Secondary | ICD-10-CM

## 2015-12-25 ENCOUNTER — Other Ambulatory Visit: Payer: Self-pay | Admitting: Family

## 2016-01-03 ENCOUNTER — Ambulatory Visit (INDEPENDENT_AMBULATORY_CARE_PROVIDER_SITE_OTHER): Payer: Commercial Managed Care - HMO | Admitting: Nurse Practitioner

## 2016-01-03 ENCOUNTER — Encounter: Payer: Self-pay | Admitting: Nurse Practitioner

## 2016-01-03 DIAGNOSIS — Z955 Presence of coronary angioplasty implant and graft: Secondary | ICD-10-CM

## 2016-01-03 DIAGNOSIS — E785 Hyperlipidemia, unspecified: Secondary | ICD-10-CM

## 2016-01-03 DIAGNOSIS — I259 Chronic ischemic heart disease, unspecified: Secondary | ICD-10-CM

## 2016-01-03 LAB — CBC
HCT: 44.4 % (ref 39.0–52.0)
Hemoglobin: 15 g/dL (ref 13.0–17.0)
MCH: 31.1 pg (ref 26.0–34.0)
MCHC: 33.8 g/dL (ref 30.0–36.0)
MCV: 92.1 fL (ref 78.0–100.0)
MPV: 10.2 fL (ref 8.6–12.4)
Platelets: 297 10*3/uL (ref 150–400)
RBC: 4.82 MIL/uL (ref 4.22–5.81)
RDW: 14 % (ref 11.5–15.5)
WBC: 6.4 10*3/uL (ref 4.0–10.5)

## 2016-01-03 LAB — BASIC METABOLIC PANEL
BUN: 16 mg/dL (ref 7–25)
CO2: 27 mmol/L (ref 20–31)
Calcium: 9.4 mg/dL (ref 8.6–10.3)
Chloride: 101 mmol/L (ref 98–110)
Creat: 1.11 mg/dL (ref 0.60–1.35)
Glucose, Bld: 121 mg/dL — ABNORMAL HIGH (ref 65–99)
Potassium: 3.9 mmol/L (ref 3.5–5.3)
Sodium: 137 mmol/L (ref 135–146)
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
## 2016-01-03 NOTE — Patient Instructions (Addendum)
We will be checking the following labs today - BMET and CBC   Medication Instructions:    Continue with your current medicines.     Testing/Procedures To Be Arranged:  N/A  Follow-Up:   See Dr. Eldridge Dace in 6 weeks    Other Special Instructions:   Think about what we talked about today.  Ok to start increasing your activity    If you need a refill on your cardiac medications before your next appointment, please call your pharmacy.   Call the [facility] office at [phone] if you have any questions, problems or concerns.

## 2016-01-22 ENCOUNTER — Ambulatory Visit (INDEPENDENT_AMBULATORY_CARE_PROVIDER_SITE_OTHER): Payer: Medicare HMO | Admitting: Licensed Clinical Social Worker

## 2016-01-22 DIAGNOSIS — F332 Major depressive disorder, recurrent severe without psychotic features: Secondary | ICD-10-CM | POA: Diagnosis not present

## 2016-02-16 IMAGING — MR MR LUMBAR SPINE W/O CM
5 series · 31 of 48 positions shown · non-contrast
Comparison: Lumbar spine radiographs 01/09/2013. Limited
correlation made with axial images only from abdominal CT
07/12/2004.

CLINICAL DATA: Chronic right hip pain, recently worsening. No acute
injury or prior relevant surgery. Initial encounter.

EXAM:
MRI LUMBAR SPINE WITHOUT CONTRAST
TECHNIQUE: Multiplanar, multisequence MR imaging of the lumbar spine was
performed. No intravenous contrast was administered.

[Series 4: T2 · sagittal · 4.0mm · 0.49mm/px · 6 of 12 slices shown (1 of 2)]
[im 1/12]
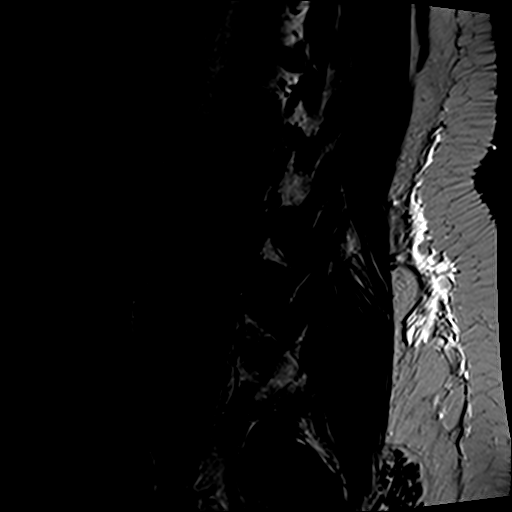
[im 3/12]
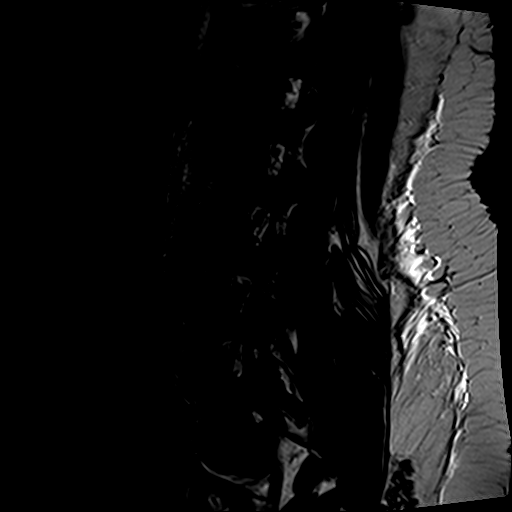
[im 5/12]
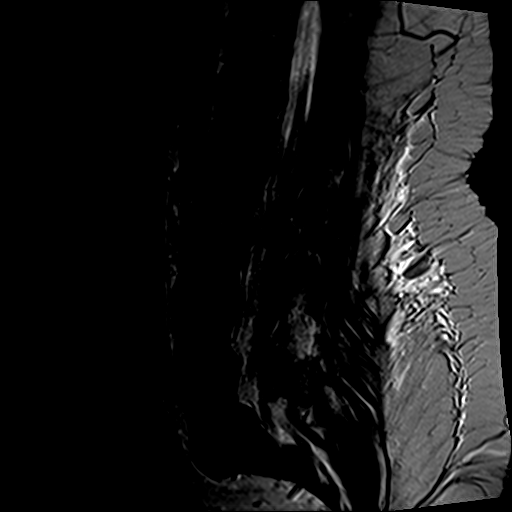
[im 7/12]
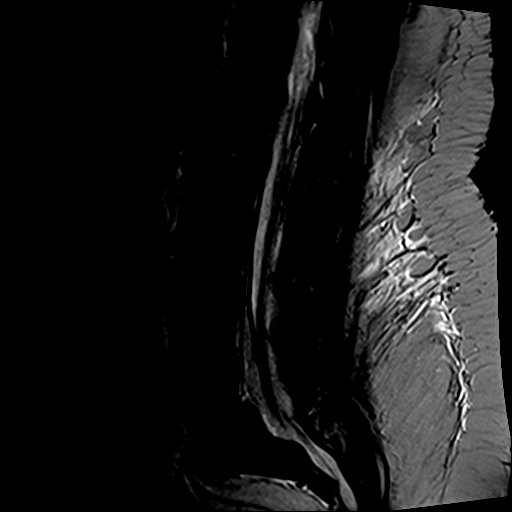
[im 9/12]
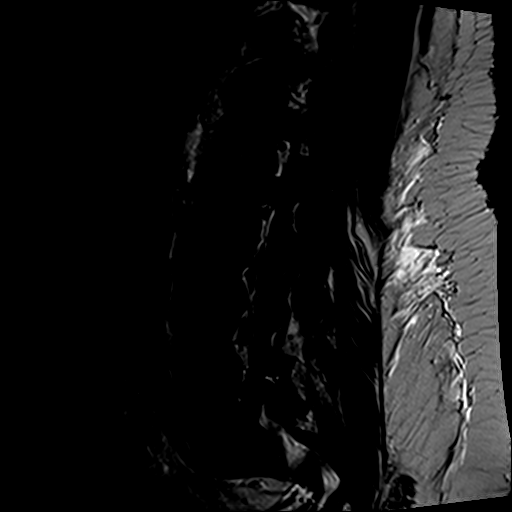
[im 12/12]
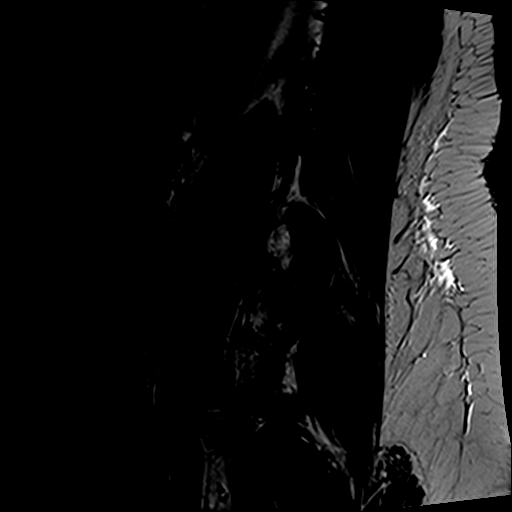

[Series 6: T1 · sagittal · 4.0mm · 0.49mm/px · 6 of 12 slices shown (1 of 2)]
[im 1/12]
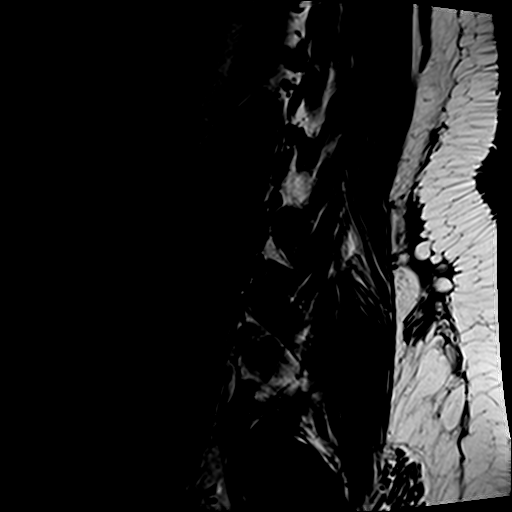
[im 3/12]
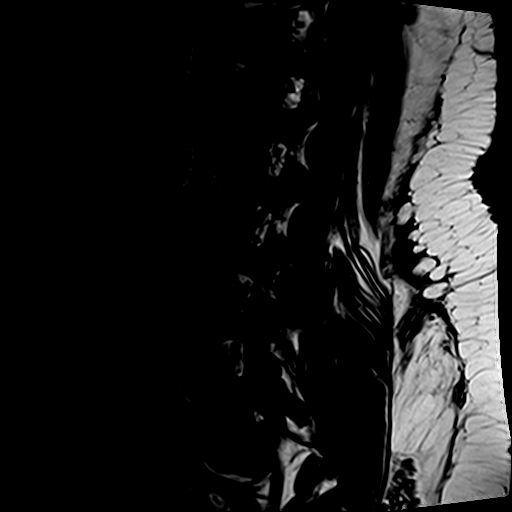
[im 5/12]
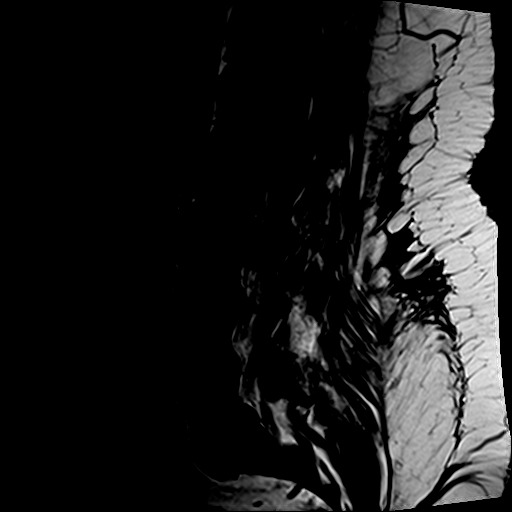
[im 7/12]
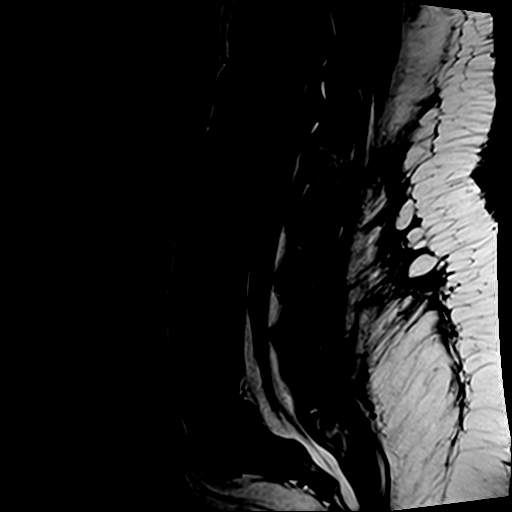
[im 9/12]
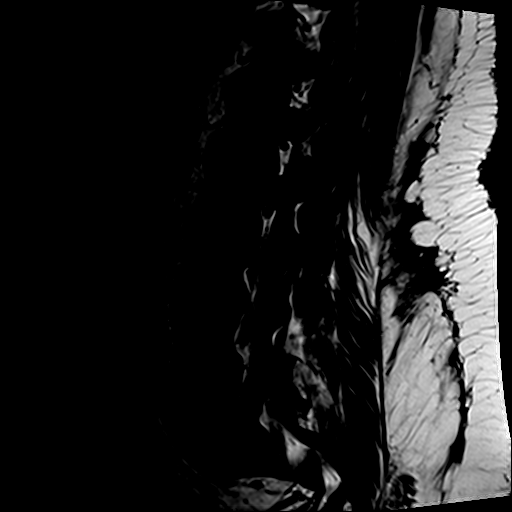
[im 12/12]
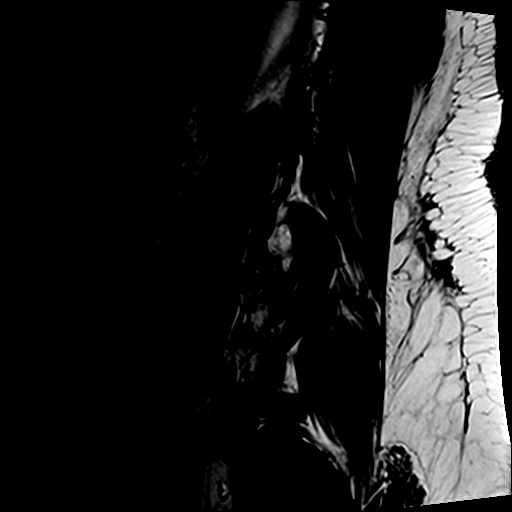

[Series 8: STIR · sagittal · 4.0mm · 0.49mm/px · 1 of 12 slices shown]
[im 1/12]
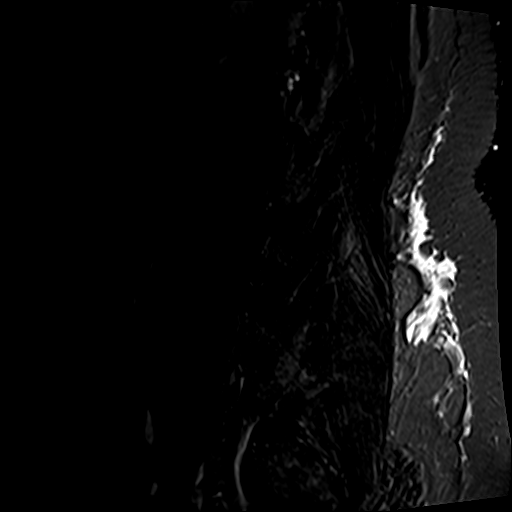

[Series 9: T2 · axial · 4.0mm · 0.74mm/px · z∈[-138,+24]mm · 9 of 28 slices shown (2 of 2)]
[im 1/28]
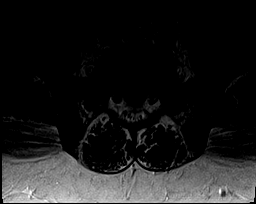
[im 4/28]
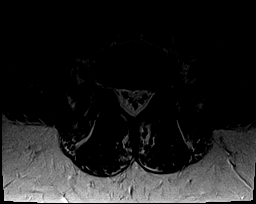
[im 8/28]
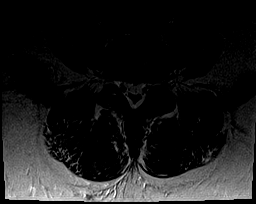
[im 12/28]
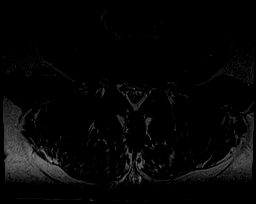
[im 14/28]
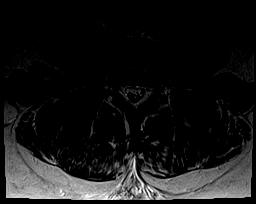
[im 16/28]
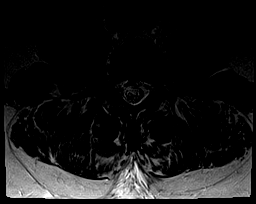
[im 20/28]
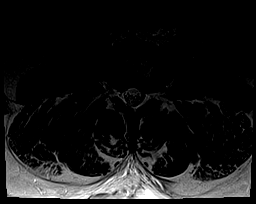
[im 24/28]
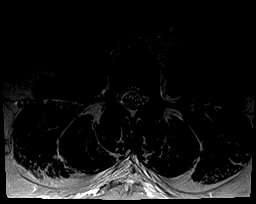
[im 28/28]
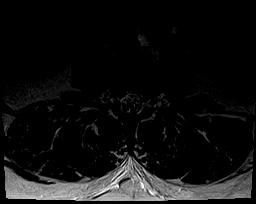

[Series 10: T1 · axial · 4.0mm · 0.74mm/px · z∈[-140,+26]mm · 9 of 28 slices shown (2 of 2)]
[im 1/28]
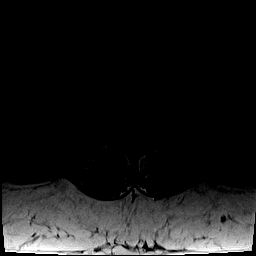
[im 4/28]
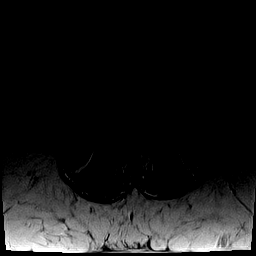
[im 8/28]
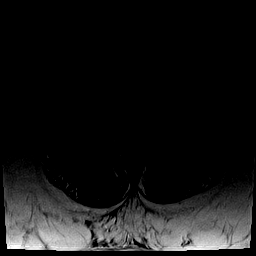
[im 12/28]
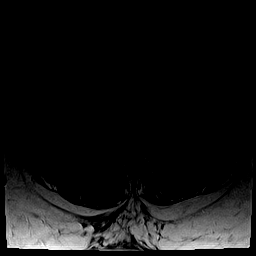
[im 14/28]
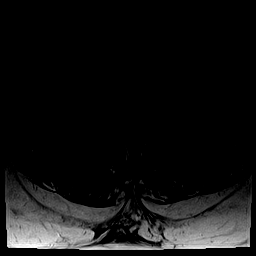
[im 16/28]
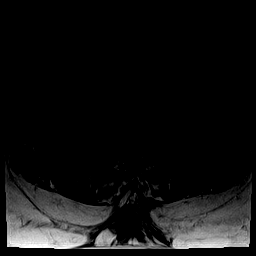
[im 20/28]
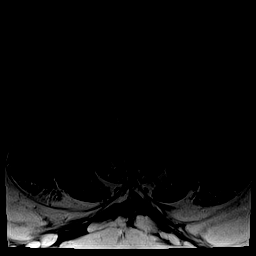
[im 24/28]
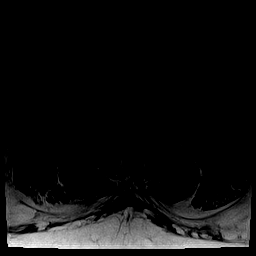
[im 28/28]
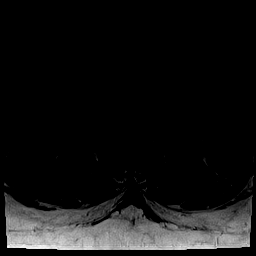

[31 of 48 positions shown; findings below may reference images not displayed]

FINDINGS: Prior studies demonstrate 5 lumbar type vertebral bodies. The
alignment is stable. There is a grade 1 anterolisthesis at L5-S1
secondary to underlying bilateral L5 pars defects. There are mild
endplate degenerative changes at L5-S1. The bone marrow signal is
diffusely decreased, likely secondary to obesity, anemia or chronic
disease. No focal abnormalities identified. The lumbar pedicles are
somewhat short on a congenital basis.

The conus medullaris extends to the L1 level and appears normal.
There is prominent epidural fat in the lower spinal canal, greatest
at L4-5 and L5-S1. No paraspinal abnormalities are seen aside from
nonspecific dependent edema in the subcutaneous fat of the mid back.

Sagittal images demonstrate mild disc desiccation and bulging at
T12-L1, but no spinal stenosis or nerve root encroachment.

There are no significant disc space findings at L1-2, L2-3 or L3-4.

L4-5: Disc height and hydration are maintained. There is mild facet
and ligamentous hypertrophy. Prominent epidural fat contributes to
mass effect on the thecal sac with mild narrowing of the lateral
recesses. The foramina are sufficiently patent.

L5-S1: Because of the L5 pars defects, resulting grade 1
anterolisthesis and diffuse disc bulging, there is moderate to
severe foraminal narrowing, left greater than right. Bilateral L5
nerve root encroachment likely. Prominent epidural fat contributes
to mass effect thecal sac.
IMPRESSION: 1. Significant foraminal narrowing bilaterally at L5-S1 due to
bilateral L5 pars defects, the resulting grade 1 anterolisthesis and
diffuse disc bulging. Bilateral L5 nerve root encroachment likely.
2. Epidural lipomatosis in the distal spinal canal contributes to
mass effect on the thecal sac. No foraminal compromise at L4-5.
3. Diffusely decreased bone signal, likely secondary to obesity,
anemia or chronic disease. No acute osseous findings.

## 2016-02-19 ENCOUNTER — Ambulatory Visit (INDEPENDENT_AMBULATORY_CARE_PROVIDER_SITE_OTHER): Payer: Medicare HMO | Admitting: Licensed Clinical Social Worker

## 2016-02-19 DIAGNOSIS — F332 Major depressive disorder, recurrent severe without psychotic features: Secondary | ICD-10-CM | POA: Diagnosis not present

## 2016-02-26 ENCOUNTER — Encounter: Payer: Self-pay | Admitting: Interventional Cardiology

## 2016-02-26 ENCOUNTER — Ambulatory Visit (INDEPENDENT_AMBULATORY_CARE_PROVIDER_SITE_OTHER): Payer: Commercial Managed Care - HMO | Admitting: Interventional Cardiology

## 2016-02-26 VITALS — BP 130/100 | HR 108 | Ht 69.0 in | Wt 301.8 lb

## 2016-02-26 DIAGNOSIS — F1010 Alcohol abuse, uncomplicated: Secondary | ICD-10-CM | POA: Diagnosis not present

## 2016-02-26 DIAGNOSIS — I10 Essential (primary) hypertension: Secondary | ICD-10-CM | POA: Diagnosis not present

## 2016-02-26 DIAGNOSIS — I2510 Atherosclerotic heart disease of native coronary artery without angina pectoris: Secondary | ICD-10-CM

## 2016-02-26 DIAGNOSIS — Z87891 Personal history of nicotine dependence: Secondary | ICD-10-CM | POA: Insufficient documentation

## 2016-02-26 DIAGNOSIS — E785 Hyperlipidemia, unspecified: Secondary | ICD-10-CM | POA: Diagnosis not present

## 2016-02-26 MED ORDER — ATORVASTATIN CALCIUM 10 MG PO TABS: 10.0000 mg | ORAL_TABLET | Freq: Every day | ORAL | Status: DC

## 2016-02-26 NOTE — Progress Notes (Signed)
Patient ID: Logan Sanchez, male   DOB: [birth year], 45 y.o.   MRN: [id number]     Cardiology Office Note   Date:  02/26/2016   ID:  Logan Sanchez, DOB 1971/04/02, MRN [id number]  PCP:  Logan Sanchez., DO    No chief complaint on file. f/u CAD   Wt Readings from Last 3 Encounters:  02/26/16 301 lb 12.8 oz (136.896 kg)  01/03/16 312 lb 1.9 oz (141.577 kg)  12/17/15 312 lb 9.6 oz (141.794 kg)       History of Present Illness: Logan Sanchez is a 45 y.o. male  Who has CAD.  He had a cath in Jan 2017.  Cath showed 99% stenosed mild LAD s/p overlapping drug-eluting stents; mid Cx 80% stenosed s/p Synergy drug-eluting stent. DAPT for at least year but probably indefinite given diffuse coronary disease. We were To consider bringing him back for further evaluation of the proximal right coronary lesion which appeared to have some spontaneous dissection. No angina so he has been medically managed.  Alcohol use has been an issue in the past.  He is drinking one can of beer a week.  He has cut out fried foods as well.   No bleeding problems on DAPT.     Past Medical History  Diagnosis Date  . 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 Arvilla Market  . Hypertension   . Obesity   . Hyperlipemia   . Mood disorder (HCC)     depression and GAD  . Alcohol abuse   . Elevated LFTs   . Erectile dysfunction   . Coronary artery disease     a. cath 12/11/15 99% stenosed mild LAD s/p overlapping drug-eluting stents; mid Cx 80% stenosed s/p Synergy drug-eluting stent; pro RCA 70% - medical managment for now, could consider cath  . Rheumatoid arthritis involving right hip (HCC)   . Depression     Past Surgical History  Procedure Laterality Date  . Cardiac catheterization N/A 12/11/2015    Procedure: Left Heart Cath and Coronary Angiography;  Surgeon: Corky Crafts, MD;  Location: [facility] INVASIVE CV LAB;  Service: Cardiovascular;  Laterality: N/A;  . Cardiac catheterization  N/A 12/11/2015    Procedure: Coronary Stent Intervention;  Surgeon: Corky Crafts, MD;  Location: [facility] INVASIVE CV LAB;  Service: Cardiovascular;  Laterality: N/A;  . Coronary angioplasty    . Forearm fracture surgery Right ~ 1998    "I've got a metal rod in it"  . Fracture surgery       Current Outpatient Prescriptions  Medication Sig Dispense Refill  . amLODipine (NORVASC) 5 MG tablet Take 1 tablet (5 mg total) by mouth daily. 90 tablet 3  . aspirin EC 81 MG tablet Take 1 tablet (81 mg total) by mouth daily. 90 tablet 3  . buPROPion (WELLBUTRIN XL) 300 MG 24 hr tablet TAKE 1 TABLET EVERY DAY 30 tablet 5  . clopidogrel (PLAVIX) 75 MG tablet Take 1 tablet (75 mg total) by mouth daily with breakfast. 30 tablet 11  . fish oil-omega-3 fatty acids 1000 MG capsule Take 1 g by mouth daily.    . Ginkgo Biloba (GNP GINGKO BILOBA EXTRACT PO) Take 1 capsule by mouth daily.    . isosorbide mononitrate (IMDUR) 30 MG 24 hr tablet Take 1 tablet (30 mg total) by mouth daily. 30 tablet 11  . losartan-hydrochlorothiazide (HYZAAR) 100-25 MG tablet TAKE 1 TABLET BY MOUTH DAILY. 30 tablet  5  . metoprolol tartrate (LOPRESSOR) 25 MG tablet Take 0.5 tablets (12.5 mg total) by mouth 2 (two) times daily. 30 tablet 6  . Misc Natural Products (GINSENG COMPLEX PO) Take 1 tablet by mouth daily.    . nitroGLYCERIN (NITROSTAT) 0.4 MG SL tablet Place 1 tablet (0.4 mg total) under the tongue every 5 (five) minutes as needed for chest pain. 25 tablet 6  . pantoprazole (PROTONIX) 40 MG tablet Take 1 tablet (40 mg total) by mouth daily. 30 tablet 11  . potassium chloride (K-DUR) 10 MEQ tablet Take 1 tablet (10 mEq total) by mouth daily. 30 tablet 0  . pravastatin (PRAVACHOL) 20 MG tablet Take 1 tablet (20 mg total) by mouth daily. 90 tablet 3   No current facility-administered medications for this visit.    Allergies:   Review of patient's allergies indicates no known allergies.    Social History:  The patient   reports that he quit smoking about 16 months ago. His smoking use included Cigarettes. He has a 37.5 pack-year smoking history. He has never used smokeless tobacco. He reports that he drinks about 45.6 oz of alcohol per week. He reports that he does not use illicit drugs.   Family History:  The patient's family history includes Heart attack in his maternal grandfather and maternal grandmother; Hypertension in his father, maternal grandfather, maternal grandmother, mother, paternal grandfather, and paternal grandmother; Stroke in his maternal grandmother.    ROS:  Please see the history of present illness.   Otherwise, review of systems are positive for intentional weight loss.   All other systems are reviewed and negative.    PHYSICAL EXAM: VS:  BP 130/100 mmHg  Pulse 108  Ht  (1.753 m)  Wt 301 lb 12.8 oz (136.896 kg)  BMI 44.55 kg/m2 , BMI Body mass index is 44.55 kg/(m^2). GEN: Well nourished, well developed, in no acute distress HEENT: normal Neck: no JVD, carotid bruits, or masses Cardiac: RRR; no murmurs, rubs, or gallops,no edema  Respiratory:  clear to auscultation bilaterally, normal work of breathing GI: soft, nontender, nondistended, + BS MS: no deformity or atrophy Skin: warm and dry, no rash Neuro:  Strength and sensation are intact Psych: euthymic mood, full affect    Recent Labs: 05/27/2015: ALT 39 01/03/2016: BUN 16; Creat 1.11; Hemoglobin 15.0; Platelets 297; Potassium 3.9; Sodium 137   Lipid Panel    Component Value Date/Time   CHOL 197 05/27/2015 0758   TRIG 249.0* 05/27/2015 0758   HDL 26.00* 05/27/2015 0758   CHOLHDL 8 05/27/2015 0758   VLDL 49.8* 05/27/2015 0758   LDLCALC 112* 02/19/2014 1542   LDLDIRECT 114.0 05/27/2015 0758     Other studies Reviewed: Additional studies/ records that were reviewed today with results demonstrating: cath records reviewed.   ASSESSMENT AND PLAN:  1. CAD : s/p PCI of LAD and circ.  Moderate disease in the  RCA 2. Alcohol abuse: I encouraged him to avoid alcohol consumption, particularly due to his antiplatelet therapy. States that he has cut back significantly and has lost weight because of this. 3. HTN: Elevated diastolic at home.  COntinue current meds.  He will check his BP at the pharmacy .  Call us with readings and if they are elevated, he can increase amlodipine to 10 mg.  He took cold medicine yesterday. 4. Hyperlipidemia:  Change pravastatin to atorvastatin 10 mg daily.  Recheck lipids/liver in 2 months. He should benefit from a more potent statin. 5. Former smoker: He  quit in September of 2015.   Current medicines are reviewed at length with the patient today.  The patient concerns regarding his medicines were addressed.  The following changes have been made:  No change  Labs/ tests ordered today include: lipids in a few months No orders of the defined types were placed in this encounter.    Recommend 150 minutes/week of aerobic exercise Low fat, low carb, high fiber diet recommended  Disposition:   FU in 6 months   Delorise JacksonSigned, Hedwig Mcfall S., MD  02/26/2016 10:05 AM    [facility] [address], [place], [place]  [address] Phone: [phone]; Fax: [phone]

## 2016-02-26 NOTE — Patient Instructions (Signed)
Medication Instructions:  1. STOP PRAVASTATIN  2. START ATORVASTATIN 10 MG DAILY  Labwork: 5/29 FASTING LIPID AND CMET LAB WORK  Testing/Procedures: NONE  Follow-Up: Your physician wants you to follow-up in: 6 MONTHS WITH DR. VARANASI. You will receive a reminder letter in the mail two months in advance. If you don't receive a letter, please call our office to schedule the follow-up appointment.   Any Other Special Instructions Will Be Listed Below (If Applicable). MONITOR BP DAILY AND CALL WITH THE READINGS TO DR. VARANASI [phone] AFTER ABOUT 3 WEEKS    If you need a refill on your cardiac medications before your next appointment, please call your pharmacy.

## 2016-03-17 ENCOUNTER — Telehealth: Payer: Self-pay | Admitting: Interventional Cardiology

## 2016-03-17 ENCOUNTER — Encounter: Payer: Self-pay | Admitting: Family Medicine

## 2016-03-17 ENCOUNTER — Ambulatory Visit (INDEPENDENT_AMBULATORY_CARE_PROVIDER_SITE_OTHER): Payer: Commercial Managed Care - HMO | Admitting: Family Medicine

## 2016-03-17 VITALS — BP 132/88 | HR 96 | Temp 98.9°F | Ht 69.0 in | Wt 308.7 lb

## 2016-03-17 DIAGNOSIS — I10 Essential (primary) hypertension: Secondary | ICD-10-CM

## 2016-03-17 DIAGNOSIS — I2510 Atherosclerotic heart disease of native coronary artery without angina pectoris: Secondary | ICD-10-CM

## 2016-03-17 DIAGNOSIS — F3342 Major depressive disorder, recurrent, in full remission: Secondary | ICD-10-CM

## 2016-03-17 DIAGNOSIS — F1010 Alcohol abuse, uncomplicated: Secondary | ICD-10-CM | POA: Diagnosis not present

## 2016-03-17 DIAGNOSIS — E785 Hyperlipidemia, unspecified: Secondary | ICD-10-CM

## 2016-03-17 NOTE — Telephone Encounter (Signed)
Walk in Pt form- reading dropped off of BP - gave to [employer]ynn

## 2016-03-17 NOTE — Patient Instructions (Signed)
BEFORE YOU LEAVE: -schedule follow up in 4 months  Please get your lab work done as planned to check your cholesterol and metabolic panel.  Congratulations on giving up alcohol! Keep up the great work!  We recommend the following healthy lifestyle measures: - eat a healthy whole foods diet consisting of regular small meals composed of vegetables, fruits, beans, nuts, seeds, healthy meats such as white chicken and fish and whole grains.  - avoid sweets, white starchy foods, fried foods, fast food, processed foods, sodas, red meet and other fattening foods.  - get a least 150-300 minutes of aerobic exercise per week.

## 2016-03-17 NOTE — Progress Notes (Signed)
HPI:  Logan Sanchez is a [age] M with a history of morbid obesity, depression, alcohol abuse, prior tobacco use, CAD s/p cardiac cath 12/2015 with DES x2, HTN, HLD here for follow up. He see cardiology and is on dual antiplatelet therapy, statin, norvasc, imdur, hyzaar, metoprolol and potassium. Reports he is feeling fine without CP, SOB, DOE or pain. Reports he is  making changes with his diet including eliminating fried foods and trying to eat more vegetables and fruits. He also has cut back on red meat. He reports he has not had an alcoholic beverage in 2 weeks. He is not getting any regular exercise. Reports is taking all medications as prescribed and has stopped drinking. Cardiologist recently changed statin. He reports he has a lab visit scheduled with his cardiologist to check his cholesterol and other labs.  ROS: See pertinent positives and negatives per HPI.  Past Medical History  Diagnosis Date  . Brain injury [facility](HCC)     report on disability for this, sees Dr. Arvilla MarketShwartz  . Hypertension   . Obesity   . Hyperlipemia   . Mood disorder (HCC)     depression and GAD  . Alcohol abuse   . Elevated LFTs   . Erectile dysfunction   . Coronary artery disease     a. cath 12/11/15 99% stenosed mild LAD s/p overlapping drug-eluting stents; mid Cx 80% stenosed s/p Synergy drug-eluting stent; pro RCA 70% - medical managment for now, could consider cath  . Rheumatoid arthritis involving right hip (HCC)   . Depression     Past Surgical History  Procedure Laterality Date  . Cardiac catheterization N/A 12/11/2015    Procedure: Left Heart Cath and Coronary Angiography;  Surgeon: Corky CraftsJayadeep S Varanasi, MD;  [facility];  Service: Cardiovascular;  Laterality: N/A;  . Cardiac catheterization N/A 12/11/2015    Procedure: Coronary Stent Intervention;  Surgeon: Corky CraftsJayadeep S Varanasi, MD;  [facility];  Service: Cardiovascular;  Laterality: N/A;  . Coronary angioplasty    .  Forearm fracture surgery Right ~ 1998    "I've got a metal rod in it"  . Fracture surgery      Family History  Problem Relation Age of Onset  . Heart attack Maternal Grandmother   . Heart attack Maternal Grandfather   . Hypertension Mother   . Hypertension Father   . Hypertension Maternal Grandmother   . Hypertension Maternal Grandfather   . Hypertension Paternal Grandmother   . Hypertension Paternal Grandfather   . Stroke Maternal Grandmother     Social History   Social History  . Marital Status: Single    Spouse Name: N/A  . Number of Children: N/A  . Years of Education: N/A   Social History Main Topics  . Smoking status: Former Smoker -- 1.50 packs/day for 25 years    Types: Cigarettes    Quit date: 09/30/2014  . Smokeless tobacco: Never Used  . Alcohol Use: 45.6 oz/week    0 Standard drinks or equivalent, 23 Cans of beer, 53 Shots of liquor per week     Comment: 12/11/2015 "I drink at least 1 40oz beer/day plus a pint of liquor 5 times/week"  . Drug Use: No  . Sexual Activity: Not Currently   Other Topics Concern  . None   Social History Narrative   Work or School: on disability for brain injury      Home Situation:       Spiritual Beliefs:  Lifestyle: no regular exercise, poor diet           Current outpatient prescriptions:  .  amLODipine (NORVASC) 5 MG tablet, Take 1 tablet (5 mg total) by mouth daily., Disp: 90 tablet, Rfl: 3 .  aspirin EC 81 MG tablet, Take 1 tablet (81 mg total) by mouth daily., Disp: 90 tablet, Rfl: 3 .  atorvastatin (LIPITOR) 10 MG tablet, Take 1 tablet (10 mg total) by mouth daily., Disp: 90 tablet, Rfl: 3 .  buPROPion (WELLBUTRIN XL) 300 MG 24 hr tablet, TAKE 1 TABLET EVERY DAY, Disp: 30 tablet, Rfl: 5 .  clopidogrel (PLAVIX) 75 MG tablet, Take 1 tablet (75 mg total) by mouth daily with breakfast., Disp: 30 tablet, Rfl: 11 .  fish oil-omega-3 fatty acids 1000 MG capsule, Take 1 g by mouth daily., Disp: , Rfl:  .  Ginkgo  Biloba (GNP GINGKO BILOBA EXTRACT PO), Take 1 capsule by mouth daily., Disp: , Rfl:  .  isosorbide mononitrate (IMDUR) 30 MG 24 hr tablet, Take 1 tablet (30 mg total) by mouth daily., Disp: 30 tablet, Rfl: 11 .  losartan-hydrochlorothiazide (HYZAAR) 100-25 MG tablet, TAKE 1 TABLET BY MOUTH DAILY., Disp: 30 tablet, Rfl: 5 .  metoprolol tartrate (LOPRESSOR) 25 MG tablet, Take 0.5 tablets (12.5 mg total) by mouth 2 (two) times daily., Disp: 30 tablet, Rfl: 6 .  Misc Natural Products (GINSENG COMPLEX PO), Take 1 tablet by mouth daily., Disp: , Rfl:  .  nitroGLYCERIN (NITROSTAT) 0.4 MG SL tablet, Place 1 tablet (0.4 mg total) under the tongue every 5 (five) minutes as needed for chest pain., Disp: 25 tablet, Rfl: 6 .  pantoprazole (PROTONIX) 40 MG tablet, Take 1 tablet (40 mg total) by mouth daily., Disp: 30 tablet, Rfl: 11 .  potassium chloride (K-DUR) 10 MEQ tablet, Take 1 tablet (10 mEq total) by mouth daily., Disp: 30 tablet, Rfl: 0  EXAM:  Filed Vitals:   03/17/16 0814  BP: 132/88  Pulse: 96  Temp: 98.9 F (37.2 C)    Body mass index is 45.57 kg/(m^2).  GENERAL: vitals reviewed and listed above, alert, oriented, appears well hydrated and in no acute distress  HEENT: atraumatic, conjunttiva clear, no obvious abnormalities on inspection of external nose and ears  NECK: no obvious masses on inspection  LUNGS: clear to auscultation bilaterally, no wheezes, rales or rhonchi, good air movement  CV: HRRR, no peripheral edema  MS: moves all extremities without noticeable abnormality  PSYCH: pleasant and cooperative, no obvious depression or anxiety  ASSESSMENT AND PLAN:  Discussed the following assessment and plan:  Coronary artery disease involving native coronary artery of native heart without angina pectoris  Essential hypertension  Alcohol abuse  Recurrent major depressive disorder, in full remission (HCC)  Morbid obesity, unspecified obesity type  (HCC)  Hyperlipidemia -Check labs in May with cardiologist as planned, now on stronger statin -Encouraged continued work on lifestyle changes and congratulated her on progress he has made -Advised working on increased activity over the summer, continued avoidance of alcohol, and continued improvement with the diet -fasting labs  advised to return or notify a doctor immediately if symptoms worsen or persist or new concerns arise.  Patient Instructions  BEFORE YOU LEAVE: -schedule follow up in 4 months  Please get your lab work done as planned to check your cholesterol and metabolic panel.  Congratulations on giving up alcohol! Keep up the great work!  We recommend the following healthy lifestyle measures: - eat a healthy whole foods diet  consisting of regular small meals composed of vegetables, fruits, beans, nuts, seeds, healthy meats such as white chicken and fish and whole grains.  - avoid sweets, white starchy foods, fried foods, fast food, processed foods, sodas, red meet and other fattening foods.  - get a least 150-300 minutes of aerobic exercise per week.       Kriste Basque R.

## 2016-03-17 NOTE — Progress Notes (Signed)
Pre visit review using our clinic review tool, if applicable. No additional management support is needed unless otherwise documented below in the visit note. 

## 2016-03-18 ENCOUNTER — Ambulatory Visit (INDEPENDENT_AMBULATORY_CARE_PROVIDER_SITE_OTHER): Payer: Medicare HMO | Admitting: Licensed Clinical Social Worker

## 2016-03-18 DIAGNOSIS — F4323 Adjustment disorder with mixed anxiety and depressed mood: Secondary | ICD-10-CM | POA: Diagnosis not present

## 2016-03-20 ENCOUNTER — Telehealth: Payer: Self-pay

## 2016-03-20 NOTE — Telephone Encounter (Addendum)
**Note De-Identified Logan Sanchez Obfuscation** FYI: The pt walked into the office on 4/18 wanting to let Dr Eldridge DaceVaranasi know that his BP was 132/88 with a HR of 96. I have been unable to reach the pt by phone.

## 2016-05-04 ENCOUNTER — Ambulatory Visit: Payer: Self-pay | Admitting: Licensed Clinical Social Worker

## 2016-05-20 ENCOUNTER — Ambulatory Visit (INDEPENDENT_AMBULATORY_CARE_PROVIDER_SITE_OTHER): Payer: Medicare HMO | Admitting: Licensed Clinical Social Worker

## 2016-05-20 DIAGNOSIS — F4323 Adjustment disorder with mixed anxiety and depressed mood: Secondary | ICD-10-CM

## 2016-06-05 ENCOUNTER — Telehealth: Payer: Self-pay | Admitting: Family Medicine

## 2016-06-05 NOTE — Telephone Encounter (Signed)
The patient is needing PAROXETINE 20 MG TAB refilled instead of his  Depression medication  [id number] - [place], [place] - [address] [phone] (Phone) [phone] (Fax)

## 2016-06-05 NOTE — Telephone Encounter (Signed)
Patient wants to go back using PAROXETINE 20 MG TAB that his brain doctor had prescribed to him in 2011. He said that he doesn't need the depression medication that Dr. Selena BattenKim prescribed to him because he is not depressed he just has anxiety. I advised him that Dr. Selena BattenKim would probably have to see him before she can prescribe him a different medication.

## 2016-06-05 NOTE — Telephone Encounter (Signed)
Please help him to set up appointment so that we can figure out the best treatment for him. Thanks.

## 2016-06-05 NOTE — Telephone Encounter (Signed)
I left a message for the pt to return my call. 

## 2016-06-08 ENCOUNTER — Ambulatory Visit (INDEPENDENT_AMBULATORY_CARE_PROVIDER_SITE_OTHER): Payer: Medicare HMO | Admitting: Licensed Clinical Social Worker

## 2016-06-08 DIAGNOSIS — F4323 Adjustment disorder with mixed anxiety and depressed mood: Secondary | ICD-10-CM | POA: Diagnosis not present

## 2016-06-09 NOTE — Telephone Encounter (Signed)
I left a detailed message for the pt to call the office to schedule an appt.

## 2016-06-30 ENCOUNTER — Ambulatory Visit: Payer: Medicare HMO | Admitting: Licensed Clinical Social Worker

## 2016-07-13 NOTE — Progress Notes (Signed)
HPI:  Logan Sanchez is a [age] M with a history of morbid obesity, GERD, depression, alcohol abuse, prior tobacco use, CAD s/p cardiac cath 12/2015 with DES x2, HTN, HLD here for follow up. He see cardiology and is on dual antiplatelet therapy, statin, norvasc, imdur, hyzaar, metoprolol and potassium. Takes wellbutrin for depression. He reports he used to take paxil and felt it worked better for his anhedonia and social anxiety. He wants to change back to paxil. Denies hx SI, thoughts self harm or current depression. Takes pantoprazole for reflux. A bit confused about some of his meds and is unsure if he is taking the PPI and the BB. Not using a pil box. Reports doing well otherwise. Reports cut back on alcohol. No CP, SOB, DOE.  ROS: See pertinent positives and negatives per HPI.  Past Medical History:  Diagnosis Date  . Alcohol abuse   . Angina pectoris (HCC) 12/09/2015  . Brain injury [facility](HCC)    report on disability for this, sees Dr. Arvilla MarketShwartz  . Coronary artery disease    a. cath 12/11/15 99% stenosed mild LAD s/p overlapping drug-eluting stents; mid Cx 80% stenosed s/p Synergy drug-eluting stent; pro RCA 70% - medical managment for now, could consider cath  . Depression   . Elevated LFTs   . Erectile dysfunction   . Hyperlipemia   . Hypertension   . Mood disorder (HCC)    depression and GAD  . Obesity   . Rheumatoid arthritis involving right hip [facility](HCC)     Past Surgical History:  Procedure Laterality Date  . CARDIAC CATHETERIZATION N/A 12/11/2015   Procedure: Left Heart Cath and Coronary Angiography;  Surgeon: Corky CraftsJayadeep S Varanasi, MD;  [facility];  Service: Cardiovascular;  Laterality: N/A;  . CARDIAC CATHETERIZATION N/A 12/11/2015   Procedure: Coronary Stent Intervention;  Surgeon: Corky CraftsJayadeep S Varanasi, MD;  [facility];  Service: Cardiovascular;  Laterality: N/A;  . CORONARY ANGIOPLASTY    . FOREARM FRACTURE SURGERY Right ~ 1998   "I've got a metal rod  in it"  . FRACTURE SURGERY      Family History  Problem Relation Age of Onset  . Heart attack Maternal Grandmother   . Heart attack Maternal Grandfather   . Hypertension Mother   . Hypertension Father   . Hypertension Maternal Grandmother   . Hypertension Maternal Grandfather   . Hypertension Paternal Grandmother   . Hypertension Paternal Grandfather   . Stroke Maternal Grandmother     Social History   Social History  . Marital status: Single    Spouse name: N/A  . Number of children: N/A  . Years of education: N/A   Social History Main Topics  . Smoking status: Former Smoker    Packs/day: 1.50    Years: 25.00    Types: Cigarettes    Quit date: 09/30/2014  . Smokeless tobacco: Never Used  . Alcohol use 45.6 oz/week    23 Cans of beer, 53 Shots of liquor per week     Comment: 12/11/2015 "I drink at least 1 40oz beer/day plus a pint of liquor 5 times/week"  . Drug use: No  . Sexual activity: Not Currently   Other Topics Concern  . None   Social History Narrative   Work or School: on disability for brain injury      Home Situation:       Spiritual Beliefs:      Lifestyle: no regular exercise, poor diet  Current Outpatient Prescriptions:  .  amLODipine (NORVASC) 5 MG tablet, Take 1 tablet (5 mg total) by mouth daily., Disp: 90 tablet, Rfl: 3 .  aspirin EC 81 MG tablet, Take 1 tablet (81 mg total) by mouth daily., Disp: 90 tablet, Rfl: 3 .  atorvastatin (LIPITOR) 10 MG tablet, Take 1 tablet (10 mg total) by mouth daily., Disp: 90 tablet, Rfl: 3 .  clopidogrel (PLAVIX) 75 MG tablet, Take 1 tablet (75 mg total) by mouth daily with breakfast., Disp: 30 tablet, Rfl: 11 .  fish oil-omega-3 fatty acids 1000 MG capsule, Take 1 g by mouth daily., Disp: , Rfl:  .  Ginkgo Biloba (GNP GINGKO BILOBA EXTRACT PO), Take 1 capsule by mouth daily., Disp: , Rfl:  .  isosorbide mononitrate (IMDUR) 30 MG 24 hr tablet, Take 1 tablet (30 mg total) by mouth daily., Disp: 30  tablet, Rfl: 11 .  losartan-hydrochlorothiazide (HYZAAR) 100-25 MG tablet, TAKE 1 TABLET BY MOUTH DAILY., Disp: 30 tablet, Rfl: 5 .  metoprolol tartrate (LOPRESSOR) 25 MG tablet, Take 0.5 tablets (12.5 mg total) by mouth 2 (two) times daily., Disp: 30 tablet, Rfl: 6 .  Misc Natural Products (GINSENG COMPLEX PO), Take 1 tablet by mouth daily., Disp: , Rfl:  .  nitroGLYCERIN (NITROSTAT) 0.4 MG SL tablet, Place 1 tablet (0.4 mg total) under the tongue every 5 (five) minutes as needed for chest pain., Disp: 25 tablet, Rfl: 6 .  pantoprazole (PROTONIX) 40 MG tablet, Take 1 tablet (40 mg total) by mouth daily., Disp: 30 tablet, Rfl: 11 .  potassium chloride (K-DUR) 10 MEQ tablet, Take 1 tablet (10 mEq total) by mouth daily., Disp: 30 tablet, Rfl: 0 .  PARoxetine (PAXIL) 20 MG tablet, Take 1 tablet (20 mg total) by mouth daily., Disp: 30 tablet, Rfl: 3  EXAM:  Vitals:   07/14/16 0929  BP: 126/80  Pulse: 79  Temp: 98.9 F (37.2 C)    Body mass index is 44.33 kg/m.  GENERAL: vitals reviewed and listed above, alert, oriented, appears well hydrated and in no acute distress  HEENT: atraumatic, conjunttiva clear, no obvious abnormalities on inspection of external nose and ears  NECK: no obvious masses on inspection  LUNGS: clear to auscultation bilaterally, no wheezes, rales or rhonchi, good air movement  CV: HRRR, no peripheral edema  MS: moves all extremities without noticeable abnormality  PSYCH: pleasant and cooperative, no obvious depression or anxiety  ASSESSMENT AND PLAN:  Discussed the following assessment and plan:  Recurrent major depressive disorder, in full remission (HCC)  Social anxiety disorder  Morbid obesity, unspecified obesity type (HCC)  Brain injury, with loss of consciousness of unspecified duration, sequela (HCC)  Hyperlipidemia  Coronary artery disease involving native coronary artery of native heart without angina pectoris  Essential  hypertension  -stop wellbutrin and change to paxil per his request after discussion risks/benefits -advised review of his medications and pill box; advised he let his cardiologist know if has not been taking BB and likely needs to restart -follow up 1 month, labs then -Patient advised to return or notify a doctor immediately if symptoms worsen or persist or new concerns arise.  Patient Instructions  BEFORE YOU LEAVE: -follow up: in 1 month, come fasting and will plan to do labs  USE a pill box. Make sure you are taking all of your medications.  Stop the wellbutrin. Start Paxil.  Don't drink alcohol.  We recommend the following healthy lifestyle: 1) Small portions - eat off of salad plate  instead of dinner plate 2) Eat a healthy clean diet with avoidance of (less then 1 serving per week) processed foods, sweetened drinks, white starches, red meat, fast foods and sweets and consisting of: * 5-9 servings per day of fresh or frozen fruits and vegetables (not corn or potatoes, not dried or canned) *nuts and seeds, beans *olives and olive oil *small portions of lean meats such as fish and white chicken  *small portions of whole grains 3)Get at least 150 minutes of sweaty aerobic exercise per week 4)reduce stress - counseling, meditation, relaxation to balance other aspects of your life     Kriste BasqueKIM, Jalia Zuniga R., DO

## 2016-07-14 ENCOUNTER — Ambulatory Visit (INDEPENDENT_AMBULATORY_CARE_PROVIDER_SITE_OTHER): Payer: Commercial Managed Care - HMO | Admitting: Family Medicine

## 2016-07-14 ENCOUNTER — Encounter: Payer: Self-pay | Admitting: Family Medicine

## 2016-07-14 VITALS — BP 126/80 | HR 79 | Temp 98.9°F | Ht 69.0 in | Wt 300.2 lb

## 2016-07-14 DIAGNOSIS — S069X9S Unspecified intracranial injury with loss of consciousness of unspecified duration, sequela: Secondary | ICD-10-CM

## 2016-07-14 DIAGNOSIS — I10 Essential (primary) hypertension: Secondary | ICD-10-CM

## 2016-07-14 DIAGNOSIS — E785 Hyperlipidemia, unspecified: Secondary | ICD-10-CM

## 2016-07-14 DIAGNOSIS — F4010 Social phobia, unspecified: Secondary | ICD-10-CM | POA: Diagnosis not present

## 2016-07-14 DIAGNOSIS — I2510 Atherosclerotic heart disease of native coronary artery without angina pectoris: Secondary | ICD-10-CM

## 2016-07-14 DIAGNOSIS — F3342 Major depressive disorder, recurrent, in full remission: Secondary | ICD-10-CM

## 2016-07-14 MED ORDER — PAROXETINE HCL 20 MG PO TABS: 20.0000 mg | ORAL_TABLET | Freq: Every day | ORAL | 3 refills | Status: DC

## 2016-07-14 NOTE — Progress Notes (Signed)
Pre visit review using our clinic review tool, if applicable. No additional management support is needed unless otherwise documented below in the visit note. 

## 2016-07-14 NOTE — Patient Instructions (Signed)
BEFORE YOU LEAVE: -follow up: in 1 month, come fasting and will plan to do labs  USE a pill box. Make sure you are taking all of your medications.  Stop the wellbutrin. Start Paxil.  Don't drink alcohol.  We recommend the following healthy lifestyle: 1) Small portions - eat off of salad plate instead of dinner plate 2) Eat a healthy clean diet with avoidance of (less then 1 serving per week) processed foods, sweetened drinks, white starches, red meat, fast foods and sweets and consisting of: * 5-9 servings per day of fresh or frozen fruits and vegetables (not corn or potatoes, not dried or canned) *nuts and seeds, beans *olives and olive oil *small portions of lean meats such as fish and white chicken  *small portions of whole grains 3)Get at least 150 minutes of sweaty aerobic exercise per week 4)reduce stress - counseling, meditation, relaxation to balance other aspects of your life

## 2016-08-14 ENCOUNTER — Encounter: Payer: Self-pay | Admitting: Family Medicine

## 2016-08-14 ENCOUNTER — Ambulatory Visit (INDEPENDENT_AMBULATORY_CARE_PROVIDER_SITE_OTHER): Payer: Commercial Managed Care - HMO | Admitting: Family Medicine

## 2016-08-14 VITALS — BP 122/88 | HR 92 | Temp 99.3°F | Ht 69.0 in | Wt 291.5 lb

## 2016-08-14 DIAGNOSIS — I10 Essential (primary) hypertension: Secondary | ICD-10-CM

## 2016-08-14 DIAGNOSIS — R739 Hyperglycemia, unspecified: Secondary | ICD-10-CM | POA: Diagnosis not present

## 2016-08-14 DIAGNOSIS — F411 Generalized anxiety disorder: Secondary | ICD-10-CM

## 2016-08-14 DIAGNOSIS — E785 Hyperlipidemia, unspecified: Secondary | ICD-10-CM | POA: Diagnosis not present

## 2016-08-14 DIAGNOSIS — F1010 Alcohol abuse, uncomplicated: Secondary | ICD-10-CM | POA: Diagnosis not present

## 2016-08-14 DIAGNOSIS — F3342 Major depressive disorder, recurrent, in full remission: Secondary | ICD-10-CM

## 2016-08-14 DIAGNOSIS — Z23 Encounter for immunization: Secondary | ICD-10-CM | POA: Diagnosis not present

## 2016-08-14 LAB — COMPREHENSIVE METABOLIC PANEL
ALBUMIN: 4.1 g/dL (ref 3.5–5.2)
ALK PHOS: 86 U/L (ref 39–117)
ALT: 28 U/L (ref 0–53)
AST: 24 U/L (ref 0–37)
BILIRUBIN TOTAL: 0.6 mg/dL (ref 0.2–1.2)
BUN: 13 mg/dL (ref 6–23)
CALCIUM: 9.3 mg/dL (ref 8.4–10.5)
CO2: 31 mEq/L (ref 19–32)
Chloride: 99 mEq/L (ref 96–112)
Creatinine, Ser: 1.02 mg/dL (ref 0.40–1.50)
GFR: 101.92 mL/min (ref >60.00)
GLUCOSE: 115 mg/dL — AB (ref 70–99)
Potassium: 3.6 mEq/L (ref 3.5–5.1)
Sodium: 139 mEq/L (ref 135–145)
TOTAL PROTEIN: 7.2 g/dL (ref 6.0–8.3)

## 2016-08-14 LAB — LIPID PANEL
CHOLESTEROL: 205 mg/dL — AB (ref 0–200)
HDL: 38.8 mg/dL — AB (ref >39.00)
NonHDL: 166.5
TRIGLYCERIDES: 349 mg/dL — AB (ref 0.0–149.0)
Total CHOL/HDL Ratio: 5
VLDL: 69.8 mg/dL — ABNORMAL HIGH (ref 0.0–40.0)

## 2016-08-14 LAB — HEMOGLOBIN A1C: HEMOGLOBIN A1C: 5.7 % (ref 4.6–6.5)

## 2016-08-14 LAB — CBC
HCT: 43 % (ref 39.0–52.0)
Hemoglobin: 15.1 g/dL (ref 13.0–17.0)
MCHC: 35.1 g/dL (ref 30.0–36.0)
MCV: 90.9 fl (ref 78.0–100.0)
Platelets: 264 10*3/uL (ref 150.0–400.0)
RBC: 4.73 Mil/uL (ref 4.22–5.81)
RDW: 15.3 % (ref 11.5–15.5)
WBC: 6.7 10*3/uL (ref 4.0–10.5)

## 2016-08-14 LAB — LDL CHOLESTEROL, DIRECT: LDL DIRECT: 101 mg/dL

## 2016-08-14 NOTE — Progress Notes (Signed)
HPI:  Logan Sanchez is a 4 M with a history of morbid obesity, GERD, depression, alcohol abuse, prior tobacco use, CAD s/p cardiac cath 12/2015 with DES x2, HTN, HLD here for 1 month follow up several issues:  Anxiety: -per his request from prior experience changed from wellbutrin to paxil 06/2016 -reports: is doing great, big improvement in anxiety and was able to give up alcohol -denies: depression, weight gain, panic, thoughts self harm.  Medication non-compliance: -advised review of medications and pill box last visit -reports: checked all pills and taking all except potassium -denies: missed doses or concerns  Alcohol abuse: -reports: quit about 1 month ago - motivation was he does not want to end up on dialysis because he hates needles -denies:cravings, withdrawal  ROS: See pertinent positives and negatives per HPI.  Past Medical History:  Diagnosis Date  . Alcohol abuse   . Angina pectoris ([place]) 12/09/2015  . Brain injury [facility])    report on disability for this, sees Dr. Marcello Moores  . Coronary artery disease    a. cath 12/11/15 99% stenosed mild LAD s/p overlapping drug-eluting stents; mid Cx 80% stenosed s/p Synergy drug-eluting stent; pro RCA 70% - medical managment for now, could consider cath  . Depression   . Elevated LFTs   . Erectile dysfunction   . Hyperlipemia   . Hypertension   . Mood disorder (HCC)    depression and GAD  . Obesity   . Rheumatoid arthritis involving right hip [facility])     Past Surgical History:  Procedure Laterality Date  . CARDIAC CATHETERIZATION N/A 12/11/2015   Procedure: Left Heart Cath and Coronary Angiography;  Surgeon: Jettie Booze, MD;  Location: [place] [facility];  Service: Cardiovascular;  Laterality: N/A;  . CARDIAC CATHETERIZATION N/A 12/11/2015   Procedure: Coronary Stent Intervention;  Surgeon: Jettie Booze, MD;  Location: [place] [facility];  Service: Cardiovascular;  Laterality: N/A;  . CORONARY ANGIOPLASTY    .  FOREARM FRACTURE SURGERY Right ~ 1998   "I've got a metal rod in it"  . FRACTURE SURGERY      Family History  Problem Relation Age of Onset  . Heart attack Maternal Grandmother   . Heart attack Maternal Grandfather   . Hypertension Mother   . Hypertension Father   . Hypertension Maternal Grandmother   . Hypertension Maternal Grandfather   . Hypertension Paternal Grandmother   . Hypertension Paternal Grandfather   . Stroke Maternal Grandmother     Social History   Social History  . Marital status: Single    Spouse name: N/A  . Number of children: N/A  . Years of education: N/A   Social History Main Topics  . Smoking status: Former Smoker    Packs/day: 1.50    Years: 25.00    Types: Cigarettes    Quit date: 09/30/2014  . Smokeless tobacco: Never Used  . Alcohol use 45.6 oz/week    23 Cans of beer, 53 Shots of liquor per week     Comment: 12/11/2015 "I drink at least 1 40oz beer/day plus a pint of liquor 5 times/week"  . Drug use: No  . Sexual activity: Not Currently   Other Topics Concern  . None   Social History Narrative   Work or School: on disability for brain injury      Home Situation:       Spiritual Beliefs:      Lifestyle: no regular exercise, poor diet  Current Outpatient Prescriptions:  .  amLODipine (NORVASC) 5 MG tablet, Take 1 tablet (5 mg total) by mouth daily., Disp: 90 tablet, Rfl: 3 .  aspirin EC 81 MG tablet, Take 1 tablet (81 mg total) by mouth daily., Disp: 90 tablet, Rfl: 3 .  atorvastatin (LIPITOR) 10 MG tablet, Take 1 tablet (10 mg total) by mouth daily., Disp: 90 tablet, Rfl: 3 .  clopidogrel (PLAVIX) 75 MG tablet, Take 1 tablet (75 mg total) by mouth daily with breakfast., Disp: 30 tablet, Rfl: 11 .  fish oil-omega-3 fatty acids 1000 MG capsule, Take 1 g by mouth daily., Disp: , Rfl:  .  Ginkgo Biloba (GNP GINGKO BILOBA EXTRACT PO), Take 1 capsule by mouth daily., Disp: , Rfl:  .  isosorbide mononitrate (IMDUR) 30 MG 24 hr  tablet, Take 1 tablet (30 mg total) by mouth daily., Disp: 30 tablet, Rfl: 11 .  losartan-hydrochlorothiazide (HYZAAR) 100-25 MG tablet, TAKE 1 TABLET BY MOUTH DAILY., Disp: 30 tablet, Rfl: 5 .  metoprolol tartrate (LOPRESSOR) 25 MG tablet, Take 0.5 tablets (12.5 mg total) by mouth 2 (two) times daily., Disp: 30 tablet, Rfl: 6 .  Misc Natural Products (GINSENG COMPLEX PO), Take 1 tablet by mouth daily., Disp: , Rfl:  .  nitroGLYCERIN (NITROSTAT) 0.4 MG SL tablet, Place 1 tablet (0.4 mg total) under the tongue every 5 (five) minutes as needed for chest pain., Disp: 25 tablet, Rfl: 6 .  pantoprazole (PROTONIX) 40 MG tablet, Take 1 tablet (40 mg total) by mouth daily., Disp: 30 tablet, Rfl: 11 .  PARoxetine (PAXIL) 20 MG tablet, Take 1 tablet (20 mg total) by mouth daily., Disp: 30 tablet, Rfl: 3  EXAM:  Vitals:   08/14/16 0928  BP: 122/88  Pulse: 92  Temp: 99.3 F (37.4 C)    Body mass index is 43.05 kg/m.  GENERAL: vitals reviewed and listed above, alert, oriented, appears well hydrated and in no acute distress  HEENT: atraumatic, conjunttiva clear, no obvious abnormalities on inspection of external nose and ears  NECK: no obvious masses on inspection  LUNGS: clear to auscultation bilaterally, no wheezes, rales or rhonchi, good air movement  CV: HRRR, no peripheral edema  MS: moves all extremities without noticeable abnormality  PSYCH: pleasant and cooperative, no obvious depression or anxiety  ASSESSMENT AND PLAN:  Discussed the following assessment and plan:  Anxiety state  Alcohol abuse  Hyperlipidemia - Plan: Lipid Panel  Recurrent major depressive disorder, in full remission ([place])  Morbid obesity, unspecified obesity type ([place])  Essential hypertension - Plan: CBC (no diff), Comprehensive metabolic panel, CANCELED: CMP with eGFR  Hyperglycemia - Plan: Hemoglobin A1c  Encounter for immunization - Plan: Flu Vaccine QUAD 36+ mos IM  -congratulated, counseled,  supported on lifestyle changes -labs and flu shot today -diet and exercise recs -Patient advised to return or notify a doctor immediately if symptoms worsen or persist or new concerns arise.  Patient Instructions  BEFORE YOU LEAVE: Flu shot Labs -follow up:  1) Medicare exam with susan in next 3 months 2) follow up with Dr. Maudie Mercury in 3-4 months  Congratulations on giving up alcohol and on the weight loss!!!   We have ordered labs or studies at this visit. It can take up to 1-2 weeks for results and processing. IF results require follow up or explanation, we will call you with instructions. Clinically stable results will be released to your [facility]. If you have not heard from [place] or cannot find your results in [facility]  in 2 weeks please contact our office at [phone].  If you are not yet signed up for [facility], please consider signing up.   We recommend the following healthy lifestyle for LIFE: 1) Small portions.   Tip: eat off of a salad plate instead of a dinner plate.  Tip: It is ok to feel hungry after a meal - that likely means you ate an appropriate portion.  Tip: if you need more or a snack choose fruits, veggies and/or a handful of nuts or seeds.  2) Eat a healthy clean diet.  * Tip: Avoid (less then 1 serving per week): processed foods, sweets, sweetened drinks, white starches (rice, flour, bread, potatoes, pasta, etc), red meat, fast foods, butter  *Tip: CHOOSE instead   * 5-9 servings per day of fresh or frozen fruits and vegetables (but not corn, potatoes, bananas, canned or dried fruit)   *nuts and seeds, beans   *olives and olive oil   *small portions of lean meats such as fish and white chicken    *small portions of whole grains  3)Get at least 150 minutes of sweaty aerobic exercise per week.  4)Reduce stress - consider counseling, meditation and relaxation to balance other aspects of your life.          Colin Benton R., DO

## 2016-08-14 NOTE — Patient Instructions (Signed)
BEFORE YOU LEAVE: Flu shot Labs -follow up:  1) Medicare exam with susan in next 3 months 2) follow up with Dr. Selena BattenKim in 3-4 months  Congratulations on giving up alcohol and on the weight loss!!!   We have ordered labs or studies at this visit. It can take up to 1-2 weeks for results and processing. IF results require follow up or explanation, we will call you with instructions. Clinically stable results will be released to your [facility]. If you have not heard from us or cannot find your results in [facility] in 2 weeks please contact our office at [phone].  If you are not yet signed [facility] for [facility], please consider signing up.   We recommend the following healthy lifestyle for LIFE: 1) Small portions.   Tip: eat off of a salad plate instead of a dinner plate.  Tip: It is ok to feel hungry after a meal - that likely means you ate an appropriate portion.  Tip: if you need more or a snack choose fruits, veggies and/or a handful of nuts or seeds.  2) Eat a healthy clean diet.  * Tip: Avoid (less then 1 serving per week): processed foods, sweets, sweetened drinks, white starches (rice, flour, bread, potatoes, pasta, etc), red meat, fast foods, butter  *Tip: CHOOSE instead   * 5-9 servings per day of fresh or frozen fruits and vegetables (but not corn, potatoes, bananas, canned or dried fruit)   *nuts and seeds, beans   *olives and olive oil   *small portions of lean meats such as fish and white chicken    *small portions of whole grains  3)Get at least 150 minutes of sweaty aerobic exercise per week.  4)Reduce stress - consider counseling, meditation and relaxation to balance other aspects of your life.

## 2016-08-14 NOTE — Progress Notes (Signed)
Pre visit review using our clinic review tool, if applicable. No additional management support is needed unless otherwise documented below in the visit note. 

## 2016-08-17 ENCOUNTER — Telehealth: Payer: Self-pay | Admitting: Interventional Cardiology

## 2016-08-17 NOTE — Telephone Encounter (Signed)
New message      Pt c/o BP issue: STAT if pt c/o blurred vision, one-sided weakness or slurred speech  1. What are your last 5 BP readings? Sept 15 122/88 at PCP office; HR 92  2. Are you having any other symptoms (ex. Dizziness, headache, blurred vision, passed out)? no 3. What is your BP issue?  Calling to give bp reading

## 2016-08-17 NOTE — Telephone Encounter (Signed)
**Note De-Identified Syrina Wake Obfuscation** The pt is advised that his BP and HR are ok. The pt is due for a F/u so we scheduled an appt for 9/21 at 10:45. The pt is in agreement with date and time of f/u.

## 2016-08-19 NOTE — Progress Notes (Deleted)
Patient ID: Logan Sanchez, male   DOB: 07/08/1971, 45 y.o.   MRN: [id number]     Cardiology Office Note   Date:  08/19/2016   ID:  DRACEN REIGLE, DOB 1971/05/07, MRN [id number]  PCP:  Terressa Koyanagi., DO    No chief complaint on file. f/u CAD   Wt Readings from Last 3 Encounters:  08/14/16 291 lb 8 oz (132.2 kg)  07/14/16 (!) 300 lb 3.2 oz (136.2 kg)  03/17/16 (!) 308 lb 11.2 oz (140 kg)       History of Present Illness: Logan Sanchez is a 45 y.o. male  Who has CAD.  He had a cath in Jan 2017.  Cath showed 99% stenosed mild LAD s/p overlapping drug-eluting stents; mid Cx 80% stenosed s/p Synergy drug-eluting stent. DAPT for at least year but probably indefinite given diffuse coronary disease. We were To consider bringing him back for further evaluation of the proximal right coronary lesion which appeared to have some spontaneous dissection. No angina so he has been medically managed.  Alcohol use has been an issue in the past.  He is drinking one can of beer a week.  He has cut out fried foods as well.   No bleeding problems on DAPT.     Past Medical History:  Diagnosis Date  . Alcohol abuse   . Angina pectoris (HCC) 12/09/2015  . Brain injury [facility])    report on disability for this, sees Dr. Arvilla Market  . Coronary artery disease    a. cath 12/11/15 99% stenosed mild LAD s/p overlapping drug-eluting stents; mid Cx 80% stenosed s/p Synergy drug-eluting stent; pro RCA 70% - medical managment for now, could consider cath  . Depression   . Elevated LFTs   . Erectile dysfunction   . Hyperlipemia   . Hypertension   . Mood disorder (HCC)    depression and GAD  . Obesity   . Rheumatoid arthritis involving right hip [facility])     Past Surgical History:  Procedure Laterality Date  . CARDIAC CATHETERIZATION N/A 12/11/2015   Procedure: Left Heart Cath and Coronary Angiography;  Surgeon: Corky Crafts, MD;  [facility]: [facility] INVASIVE CV LAB;  Service: Cardiovascular;  Laterality: N/A;   . CARDIAC CATHETERIZATION N/A 12/11/2015   Procedure: Coronary Stent Intervention;  Surgeon: Corky Crafts, MD;  Location: [facility] INVASIVE CV LAB;  Service: Cardiovascular;  Laterality: N/A;  . CORONARY ANGIOPLASTY    . FOREARM FRACTURE SURGERY Right ~ 1998   "I've got a metal rod in it"  . FRACTURE SURGERY       Current Outpatient Prescriptions  Medication Sig Dispense Refill  . amLODipine (NORVASC) 5 MG tablet Take 1 tablet (5 mg total) by mouth daily. 90 tablet 3  . aspirin EC 81 MG tablet Take 1 tablet (81 mg total) by mouth daily. 90 tablet 3  . atorvastatin (LIPITOR) 10 MG tablet Take 1 tablet (10 mg total) by mouth daily. 90 tablet 3  . clopidogrel (PLAVIX) 75 MG tablet Take 1 tablet (75 mg total) by mouth daily with breakfast. 30 tablet 11  . fish oil-omega-3 fatty acids 1000 MG capsule Take 1 g by mouth daily.    . Ginkgo Biloba (GNP GINGKO BILOBA EXTRACT PO) Take 1 capsule by mouth daily.    . isosorbide mononitrate (IMDUR) 30 MG 24 hr tablet Take 1 tablet (30 mg total) by mouth daily. 30 tablet 11  . losartan-hydrochlorothiazide (HYZAAR) 100-25 MG tablet TAKE 1 TABLET BY MOUTH  DAILY. 30 tablet 5  . metoprolol tartrate (LOPRESSOR) 25 MG tablet Take 0.5 tablets (12.5 mg total) by mouth 2 (two) times daily. 30 tablet 6  . Misc Natural Products (GINSENG COMPLEX PO) Take 1 tablet by mouth daily.    . nitroGLYCERIN (NITROSTAT) 0.4 MG SL tablet Place 1 tablet (0.4 mg total) under the tongue every 5 (five) minutes as needed for chest pain. 25 tablet 6  . pantoprazole (PROTONIX) 40 MG tablet Take 1 tablet (40 mg total) by mouth daily. 30 tablet 11  . PARoxetine (PAXIL) 20 MG tablet Take 1 tablet (20 mg total) by mouth daily. 30 tablet 3   No current facility-administered medications for this visit.     Allergies:   Review of patient's allergies indicates no known allergies.    Social History:  The patient  reports that he quit smoking about 22 months ago. His smoking use included  Cigarettes. He has a 37.50 pack-year smoking history. He has never used smokeless tobacco. He reports that he drinks about 45.6 oz of alcohol per week . He reports that he does not use drugs.   Family History:  The patient's family history includes Heart attack in his maternal grandfather and maternal grandmother; Hypertension in his father, maternal grandfather, maternal grandmother, mother, paternal grandfather, and paternal grandmother; Stroke in his maternal grandmother.    ROS:  Please see the history of present illness.   Otherwise, review of systems are positive for intentional weight loss.   All other systems are reviewed and negative.    PHYSICAL EXAM: VS:  There were no vitals taken for this visit. , BMI There is no height or weight on file to calculate BMI. GEN: Well nourished, well developed, in no acute distress HEENT: normal Neck: no JVD, carotid bruits, or masses Cardiac: RRR; no murmurs, rubs, or gallops,no edema  Respiratory:  clear to auscultation bilaterally, normal work of breathing GI: soft, nontender, nondistended, + BS MS: no deformity or atrophy Skin: warm and dry, no rash Neuro:  Strength and sensation are intact Psych: euthymic mood, full affect    Recent Labs: 08/14/2016: ALT 28; BUN 13; Creatinine, Ser 1.02; Hemoglobin 15.1; Platelets 264.0; Potassium 3.6; Sodium 139   Lipid Panel    Component Value Date/Time   CHOL 205 (H) 08/14/2016 1005   TRIG 349.0 (H) 08/14/2016 1005   HDL 38.80 (L) 08/14/2016 1005   CHOLHDL 5 08/14/2016 1005   VLDL 69.8 (H) 08/14/2016 1005   LDLCALC 112 (H) 02/19/2014 1542   LDLDIRECT 101.0 08/14/2016 1005     Other studies Reviewed: Additional studies/ records that were reviewed today with results demonstrating: cath records reviewed.   ASSESSMENT AND PLAN:  1. CAD : s/p PCI of LAD and circ.  Moderate disease in the RCA 2. Alcohol abuse: I encouraged him to avoid alcohol consumption, particularly due to his antiplatelet  therapy. States that he has cut back significantly and has lost weight because of this. 3. HTN: Elevated diastolic at home.  COntinue current meds.  He will check his BP at the pharmacy .  Call us with readings and if they are elevated, he can increase amlodipine to 10 mg.  He took cold medicine yesterday. 4. Hyperlipidemia:  Change pravastatin to atorvastatin 10 mg daily.  Recheck lipids/liver in 2 months. He should benefit from a more potent statin. 5. Former smoker: He quit in September of 2015.   Current medicines are reviewed at length with the patient today.  The patient concerns regarding  his medicines were addressed.  The following changes have been made:  No change  Labs/ tests ordered today include: lipids in a few months No orders of the defined types were placed in this encounter.   Recommend 150 minutes/week of aerobic exercise Low fat, low carb, high fiber diet recommended  Disposition:   FU in 6 months   Signed, Lance Muss, MD  08/19/2016 4:57 PM    [facility] [address] [place], [place], [place]  [address] Phone: [phone]; Fax: [phone]

## 2016-08-20 ENCOUNTER — Ambulatory Visit: Payer: Commercial Managed Care - HMO | Admitting: Interventional Cardiology

## 2016-08-25 MED ORDER — ATORVASTATIN CALCIUM 20 MG PO TABS: 20.0000 mg | ORAL_TABLET | Freq: Every day | ORAL | 1 refills | Status: DC

## 2016-08-25 NOTE — Addendum Note (Signed)
Addended by: Johnella MoloneyFUNDERBURK, JO A on: 08/25/2016 09:29 AM   Modules accepted: Orders

## 2016-09-22 ENCOUNTER — Other Ambulatory Visit: Payer: Self-pay | Admitting: *Deleted

## 2016-09-22 MED ORDER — PAROXETINE HCL 20 MG PO TABS: 20.0000 mg | ORAL_TABLET | Freq: Every day | ORAL | 1 refills | Status: DC

## 2016-09-22 NOTE — Telephone Encounter (Signed)
Rx done. 

## 2016-10-12 ENCOUNTER — Other Ambulatory Visit: Payer: Self-pay | Admitting: *Deleted

## 2016-10-12 ENCOUNTER — Other Ambulatory Visit: Payer: Self-pay | Admitting: Interventional Cardiology

## 2016-10-12 MED ORDER — LOSARTAN POTASSIUM-HCTZ 100-25 MG PO TABS: 1.0000 | ORAL_TABLET | Freq: Every day | ORAL | 1 refills | Status: DC

## 2016-10-12 MED ORDER — ATORVASTATIN CALCIUM 20 MG PO TABS: 20.0000 mg | ORAL_TABLET | Freq: Every day | ORAL | 0 refills | Status: DC

## 2016-10-12 MED ORDER — PAROXETINE HCL 20 MG PO TABS: 20.0000 mg | ORAL_TABLET | Freq: Every day | ORAL | 1 refills | Status: AC

## 2016-10-12 NOTE — Telephone Encounter (Signed)
Rx done. 

## 2016-10-16 ENCOUNTER — Ambulatory Visit (INDEPENDENT_AMBULATORY_CARE_PROVIDER_SITE_OTHER): Payer: Commercial Managed Care - HMO | Admitting: Interventional Cardiology

## 2016-10-16 ENCOUNTER — Encounter: Payer: Self-pay | Admitting: Interventional Cardiology

## 2016-10-16 VITALS — BP 156/110 | HR 124 | Ht 69.0 in | Wt 295.8 lb

## 2016-10-16 DIAGNOSIS — I10 Essential (primary) hypertension: Secondary | ICD-10-CM | POA: Diagnosis not present

## 2016-10-16 DIAGNOSIS — I2510 Atherosclerotic heart disease of native coronary artery without angina pectoris: Secondary | ICD-10-CM | POA: Diagnosis not present

## 2016-10-16 DIAGNOSIS — E782 Mixed hyperlipidemia: Secondary | ICD-10-CM

## 2016-10-16 MED ORDER — AMLODIPINE BESYLATE 10 MG PO TABS: 10.0000 mg | ORAL_TABLET | Freq: Every day | ORAL | 3 refills | Status: AC

## 2016-10-16 MED ORDER — NITROGLYCERIN 0.4 MG SL SUBL: 0.4000 mg | SUBLINGUAL_TABLET | SUBLINGUAL | 3 refills | Status: AC | PRN

## 2016-10-16 NOTE — Patient Instructions (Signed)
Medication Instructions:  Increase Amlodipine to 10 mg daily. All other medications remain the same.  Labwork: None  Testing/Procedures: None  Follow-Up: Your physician wants you to follow-up in: 6 months. You will receive a reminder letter in the mail two months in advance. If you don't receive a letter, please call our office to schedule the follow-up appointment.     If you need a refill on your cardiac medications before your next appointment, please call your pharmacy.

## 2016-10-16 NOTE — Progress Notes (Signed)
Patient ID: Logan Sanchez, male   DOB: [birth year], 45 y.o.   MRN: [id number]     Cardiology Office Note   Date:  10/16/2016   ID:  Logan BrilliantBrian G Sanchez, DOB [birth year], MRN [id number]  PCP:  Terressa KoyanagiKIM, HANNAH R., DO    Chief Complaint  Patient presents with  . Follow-up    CAD  . Tachycardia  f/u CAD   Wt Readings from Last 3 Encounters:  10/16/16 134.2 kg (295 lb 12.8 oz)  08/14/16 132.2 kg (291 lb 8 oz)  07/14/16 (!) 136.2 kg (300 lb 3.2 oz)       History of Present Illness: Logan Sanchez is a 45 y.o. male  Who has CAD.  He had a cath in Jan 2017.  Cath showed 99% stenosed mild LAD s/p overlapping drug-eluting stents; mid Cx 80% stenosed s/p Synergy drug-eluting stent. DAPT for at least year but probably indefinite given diffuse coronary disease. We were To consider bringing him back for further evaluation of the proximal right coronary lesion which appeared to have some spontaneous dissection. No angina so he has been medically managed.  Alcohol use has been an issue in the past.  He is drinking less.  He has decreased  fried foods as well.   No bleeding problems on DAPT.  He does take the Plavix refularly.    He has missed some BP meds recently.  He did not take his beta blocker this AM.    [employer] work is his most strenuous exercise.    On disability from a prior head injury.     Past Medical History:  Diagnosis Date  . Alcohol abuse   . Angina pectoris (HCC) 12/09/2015  . Brain injury [facility](HCC)    report on disability for this, sees Dr. Arvilla MarketShwartz  . Coronary artery disease    a. cath 12/11/15 99% stenosed mild LAD s/p overlapping drug-eluting stents; mid Cx 80% stenosed s/p Synergy drug-eluting stent; pro RCA 70% - medical managment for now, could consider cath  . Depression   . Elevated LFTs   . Erectile dysfunction   . Hyperlipemia   . Hypertension   . Mood disorder (HCC)    depression and GAD  . Obesity   . Rheumatoid arthritis involving right hip [facility](HCC)     Past  Surgical History:  Procedure Laterality Date  . CARDIAC CATHETERIZATION N/A 12/11/2015   Procedure: Left Heart Cath and Coronary Angiography;  Surgeon: Corky CraftsJayadeep S Denaly Gatling, MD;  [facility];  Service: Cardiovascular;  Laterality: N/A;  . CARDIAC CATHETERIZATION N/A 12/11/2015   Procedure: Coronary Stent Intervention;  Surgeon: Corky CraftsJayadeep S Cashius Grandstaff, MD;  [facility];  Service: Cardiovascular;  Laterality: N/A;  . CORONARY ANGIOPLASTY    . FOREARM FRACTURE SURGERY Right ~ 1998   "I've got a metal rod in it"  . FRACTURE SURGERY       Current Outpatient Prescriptions  Medication Sig Dispense Refill  . amLODipine (NORVASC) 5 MG tablet Take 1 tablet (5 mg total) by mouth daily. 90 tablet 3  . aspirin EC 81 MG tablet Take 1 tablet (81 mg total) by mouth daily. 90 tablet 3  . atorvastatin (LIPITOR) 20 MG tablet Take 1 tablet (20 mg total) by mouth daily. 90 tablet 0  . clopidogrel (PLAVIX) 75 MG tablet Take 1 tablet (75 mg total) by mouth daily with breakfast. 30 tablet 11  . fish oil-omega-3 fatty acids 1000 MG capsule Take 1 g by mouth daily.    .Marland Kitchen  Ginkgo Biloba (GNP GINGKO BILOBA EXTRACT PO) Take 1 capsule by mouth daily.    . isosorbide mononitrate (IMDUR) 30 MG 24 hr tablet Take 1 tablet (30 mg total) by mouth daily. 30 tablet 11  . losartan-hydrochlorothiazide (HYZAAR) 100-25 MG tablet Take 1 tablet by mouth daily. 90 tablet 1  . metoprolol tartrate (LOPRESSOR) 25 MG tablet Take 25 mg by mouth daily.    . Misc Natural Products (GINSENG COMPLEX PO) Take 1 tablet by mouth daily.    . nitroGLYCERIN (NITROSTAT) 0.4 MG SL tablet Place 1 tablet (0.4 mg total) under the tongue every 5 (five) minutes as needed for chest pain. 25 tablet 6  . pantoprazole (PROTONIX) 40 MG tablet Take 1 tablet (40 mg total) by mouth daily. 30 tablet 11  . PARoxetine (PAXIL) 20 MG tablet Take 1 tablet (20 mg total) by mouth daily. 90 tablet 1   No current facility-administered medications  for this visit.     Allergies:   Patient has no known allergies.    Social History:  The patient  reports that he quit smoking about 2 years ago. His smoking use included Cigarettes. He has a 37.50 pack-year smoking history. He has never used smokeless tobacco. He reports that he drinks about 45.6 oz of alcohol per week . He reports that he does not use drugs.   Family History:  The patient's family history includes Heart attack in his maternal grandfather and maternal grandmother; Hypertension in his father, maternal grandfather, maternal grandmother, mother, paternal grandfather, and paternal grandmother; Stroke in his maternal grandmother.    ROS:  Please see the history of present illness.   Otherwise, review of systems are positive for intentional weight loss.   All other systems are reviewed and negative.    PHYSICAL EXAM: VS:  BP (!) 156/110   Pulse (!) 124   Ht 5\' 9"  (1.753 m)   Wt 134.2 kg (295 lb 12.8 oz)   BMI 43.68 kg/m  , BMI Body mass index is 43.68 kg/m. GEN: Well nourished, well developed, in no acute distress  HEENT: normal  Neck: no JVD, carotid bruits, or masses Cardiac: RRR; no murmurs, rubs, or gallops,no edema  Respiratory:  clear to auscultation bilaterally, normal work of breathing GI: soft, nontender, nondistended, + BS MS: no deformity or atrophy  Skin: warm and dry, no rash Neuro:  Strength and sensation are intact Psych: euthymic mood, full affect    Recent Labs: 08/14/2016: ALT 28; BUN 13; Creatinine, Ser 1.02; Hemoglobin 15.1; Platelets 264.0; Potassium 3.6; Sodium 139   Lipid Panel    Component Value Date/Time   CHOL 205 (H) 08/14/2016 1005   TRIG 349.0 (H) 08/14/2016 1005   HDL 38.80 (L) 08/14/2016 1005   CHOLHDL 5 08/14/2016 1005   VLDL 69.8 (H) 08/14/2016 1005   LDLCALC 112 (H) 02/19/2014 1542   LDLDIRECT 101.0 08/14/2016 1005     Other studies Reviewed: Additional studies/ records that were reviewed today with results demonstrating:  cath records reviewed.   ASSESSMENT AND PLAN:  1. CAD : s/p PCI of LAD and circ.  Moderate disease in the RCA.  He is trying to decreased fatty food intake.   2. Alcohol abuse: I encouraged him to avoid alcohol consumption, particularly due to his antiplatelet therapy. States that he has cut back significantly and has lost weight because of this.  He still uses alcohol to help relax during the game.  3. HTN: Elevated diastolic at home.  COntinue current meds.  He will check his BP at the pharmacy .  He has [facility], up to 170 systolic (lowest is 140s). WiIl increase amlodipine to 10 mg.   4. Hyperlipidemia:  Changed pravastatin to atorvastatin 10 mg daily.  Subsequently increased to 20 mg.  Recheck lipids/liver in 1-2 months. He should benefit from a more potent statin. 5. Former smoker: He quit in September of 2015.   Current medicines are reviewed at length with the patient today.  The patient concerns regarding his medicines were addressed.  The following changes have been made:  No change  Labs/ tests ordered today include: lipids in a few months No orders of the defined types were placed in this encounter.   Recommend 150 minutes/week of aerobic exercise Low fat, low carb, high fiber diet recommended  Disposition:   FU in 6 months   Signed, Lance MussJayadeep Ion Gonnella, MD  10/16/2016 11:27 AM    [facility] [address], [place], [place]  [address] Phone: [phone]; Fax: [phone]

## 2016-11-13 ENCOUNTER — Ambulatory Visit: Payer: Self-pay
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
## 2016-11-19 ENCOUNTER — Ambulatory Visit: Payer: Self-pay
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
## 2016-12-11 ENCOUNTER — Ambulatory Visit (INDEPENDENT_AMBULATORY_CARE_PROVIDER_SITE_OTHER): Payer: Commercial Managed Care - HMO | Admitting: Family Medicine

## 2016-12-11 ENCOUNTER — Encounter: Payer: Self-pay | Admitting: Family Medicine

## 2016-12-11 VITALS — BP 132/88 | HR 100 | Temp 98.8°F | Ht 69.0 in | Wt 291.9 lb

## 2016-12-11 DIAGNOSIS — F1010 Alcohol abuse, uncomplicated: Secondary | ICD-10-CM

## 2016-12-11 DIAGNOSIS — E782 Mixed hyperlipidemia: Secondary | ICD-10-CM

## 2016-12-11 DIAGNOSIS — I10 Essential (primary) hypertension: Secondary | ICD-10-CM

## 2016-12-11 DIAGNOSIS — Z0000 Encounter for general adult medical examination without abnormal findings: Secondary | ICD-10-CM

## 2016-12-11 DIAGNOSIS — Z87891 Personal history of nicotine dependence: Secondary | ICD-10-CM

## 2016-12-11 DIAGNOSIS — F3342 Major depressive disorder, recurrent, in full remission: Secondary | ICD-10-CM

## 2016-12-11 DIAGNOSIS — I2510 Atherosclerotic heart disease of native coronary artery without angina pectoris: Secondary | ICD-10-CM

## 2016-12-11 NOTE — Patient Instructions (Addendum)
BEFORE YOU LEAVE: -Medicare Wellness Exam -follow up: 1) Fasting lab visit in next 1 week 2) Follow up with Dr. Selena Batten in 3-4 months  When you get home, make sure you are taking all medications. Let us know if you are out of anything. Request and updated medication list every time you see a doctor and then check your pill box each week with the list.   We recommend the following healthy lifestyle for LIFE: 1) Small portions.    Tip: eat off of a salad plate instead of a dinner plate.  Tip: if you need more or a snack choose fruits, veggies and/or a handful of nuts or seeds.  2) Eat a healthy clean diet.  * Tip: Avoid (less then 1 serving per week): processed foods, sweets, sweetened drinks, white starches (rice, flour, bread, potatoes, pasta, etc), red meat, fast foods, butter  *Tip: CHOOSE instead   * 5-9 servings per day of fresh or frozen fruits and vegetables (but not corn, potatoes, bananas, canned or dried fruit)   *nuts and seeds, beans   *olives and olive oil   *small portions of lean meats such as fish and white chicken    *small portions of whole grains  3)Get at least 150 minutes of sweaty aerobic exercise per week.  4)Reduce stress - consider counseling, meditation and relaxation to balance other aspects of your life.    Mr. Logan Sanchez , Thank you for taking time to come for your Medicare Wellness Visit. I appreciate your ongoing commitment to your health goals. Please review the following plan we discussed and let me know if I can assist you in the future.   These are the goals we discussed: Goals    . Exercise 150 minutes per week (moderate activity)          Search for a gym    . patient          Patient is going to take "baby steps" to his new life;  Go slow and easy; Plan long term is to quit totally  Aspires to take your place as the leader of the family .       This is a list of the screening recommended for you and due dates:  Health Maintenance  Topic  Date Due  . HIV Screening  05/14/1987  . Tetanus Vaccine  05/14/1991  . Flu Shot  Completed      Health Maintenance, Male A healthy lifestyle and preventative care can promote health and wellness.  Maintain regular health, dental, and eye exams.  Eat a healthy diet. Foods like vegetables, fruits, whole grains, low-fat dairy products, and lean protein foods contain the nutrients you need and are low in calories. Decrease your intake of foods high in solid fats, added sugars, and salt. Get information about a proper diet from your health care provider, if necessary.  Regular physical exercise is one of the most important things you can do for your health. Most adults should get at least 150 minutes of moderate-intensity exercise (any activity that increases your heart rate and causes you to sweat) each week. In addition, most adults need muscle-strengthening exercises on 2 or more days a week.   Maintain a healthy weight. The body mass index (BMI) is a screening tool to identify possible weight problems. It provides an estimate of body fat based on height and weight. Your health care provider can find your BMI and can help you achieve or maintain a healthy weight. For  males 20 years and older:  A BMI below 18.5 is considered underweight.  A BMI of 18.5 to 24.9 is normal.  A BMI of 25 to 29.9 is considered overweight.  A BMI of 30 and above is considered obese.  Maintain normal blood lipids and cholesterol by exercising and minimizing your intake of saturated fat. Eat a balanced diet with plenty of fruits and vegetables. Blood tests for lipids and cholesterol should begin at age 19 and be repeated every 5 years. If your lipid or cholesterol levels are high, you are over age 4, or you are at high risk for heart disease, you may need your cholesterol levels checked more frequently.Ongoing high lipid and cholesterol levels should be treated with medicines if diet and exercise are not  working.  If you smoke, find out from your health care provider how to quit. If you do not use tobacco, do not start.  Lung cancer screening is recommended for adults aged 55-80 years who are at high risk for developing lung cancer because of a history of smoking. A yearly low-dose CT scan of the lungs is recommended for people who have at least a 30-pack-year history of smoking and are current smokers or have quit within the past 15 years. A pack year of smoking is smoking an average of 1 pack of cigarettes a day for 1 year (for example, a 30-pack-year history of smoking could mean smoking 1 pack a day for 30 years or 2 packs a day for 15 years). Yearly screening should continue until the smoker has stopped smoking for at least 15 years. Yearly screening should be stopped for people who develop a health problem that would prevent them from having lung cancer treatment.  If you choose to drink alcohol, do not have more than 2 drinks per day. One drink is considered to be 12 oz (360 mL) of beer, 5 oz (150 mL) of wine, or 1.5 oz (45 mL) of liquor.  Avoid the use of street drugs. Do not share needles with anyone. Ask for help if you need support or instructions about stopping the use of drugs.  High blood pressure causes heart disease and increases the risk of stroke. High blood pressure is more likely to develop in:  People who have blood pressure in the end of the normal range (100-139/85-89 mm Hg).  People who are overweight or obese.  People who are African American.  If you are 28-28 years of age, have your blood pressure checked every 3-5 years. If you are 84 years of age or older, have your blood pressure checked every year. You should have your blood pressure measured twice-once when you are at a hospital or clinic, and once when you are not at a hospital or clinic. Record the average of the two measurements. To check your blood pressure when you are not at a hospital or clinic, you can  use:  An automated blood pressure machine at a pharmacy.  A home blood pressure monitor.  If you are 60-54 years old, ask your health care provider if you should take aspirin to prevent heart disease.  Diabetes screening involves taking a blood sample to check your fasting blood sugar level. This should be done once every 3 years after age 58 if you are at a normal weight and without risk factors for diabetes. Testing should be considered at a younger age or be carried out more frequently if you are overweight and have at least 1 risk factor  for diabetes.  Colorectal cancer can be detected and often prevented. Most routine colorectal cancer screening begins at the age of 45 and continues through age 45. However, your health care provider may recommend screening at an earlier age if you have risk factors for colon cancer. On a yearly basis, your health care provider may provide home test kits to check for hidden blood in the stool. A small camera at the end of a tube may be used to directly examine the colon (sigmoidoscopy or colonoscopy) to detect the earliest forms of colorectal cancer. Talk to your health care provider about this at age 45 when routine screening begins. A direct exam of the colon should be repeated every 5-10 years through age 45, unless early forms of precancerous polyps or small growths are found.  People who are at an increased risk for hepatitis B should be screened for this virus. You are considered at high risk for hepatitis B if:  You were born in a country where hepatitis B occurs often. Talk with your health care provider about which countries are considered high risk.  Your parents were born in a high-risk country and you have not received a shot to protect against hepatitis B (hepatitis B vaccine).  You have HIV or AIDS.  You use needles to inject street drugs.  You live with, or have sex with, someone who has hepatitis B.  You are a man who has sex with other  men (MSM).  You get hemodialysis treatment.  You take certain medicines for conditions like cancer, organ transplantation, and autoimmune conditions.  Hepatitis C blood testing is recommended for all people born from [id number] through 1965 and any individual with known risk factors for hepatitis C.  Healthy men should no longer receive prostate-specific antigen (PSA) blood tests as part of routine cancer screening. Talk to your health care provider about prostate cancer screening.  Testicular cancer screening is not recommended for adolescents or adult males who have no symptoms. Screening includes self-exam, a health care provider exam, and other screening tests. Consult with your health care provider about any symptoms you have or any concerns you have about testicular cancer.  Practice safe sex. Use condoms and avoid high-risk sexual practices to reduce the spread of sexually transmitted infections (STIs).  You should be screened for STIs, including gonorrhea and chlamydia if:  You are sexually active and are younger than 24 years.  You are older than 24 years, and your health care provider tells you that you are at risk for this type of infection.  Your sexual activity has changed since you were last screened, and you are at an increased risk for chlamydia or gonorrhea. Ask your health care provider if you are at risk.  If you are at risk of being infected with HIV, it is recommended that you take a prescription medicine daily to prevent HIV infection. This is called pre-exposure prophylaxis (PrEP). You are considered at risk if:  You are a man who has sex with other men (MSM).  You are a heterosexual man who is sexually active with multiple partners.  You take drugs by injection.  You are sexually active with a partner who has HIV.  Talk with your health care provider about whether you are at high risk of being infected with HIV. If you choose to begin PrEP, you should first be tested  for HIV. You should then be tested every 3 months for as long as you are taking PrEP.  Use sunscreen. Apply sunscreen liberally and repeatedly throughout the day. You should seek shade when your shadow is shorter than you. Protect yourself by wearing long sleeves, pants, a wide-brimmed hat, and sunglasses year round whenever you are outdoors.  Tell your health care provider of new moles or changes in moles, especially if there is a change in shape or color. Also, tell your health care provider if a mole is larger than the size of a pencil eraser.  A one-time screening for abdominal aortic aneurysm (AAA) and surgical repair of large AAAs by ultrasound is recommended for men aged 65-75 years who are current or former smokers.  Stay current with your vaccines (immunizations). This information is not intended to replace advice given to you by your health care provider. Make sure you discuss any questions you have with your health care provider. Document Released: 05/14/2008 Document Revised: 12/07/2014 Document Reviewed: 08/20/2015 Elsevier Interactive Patient Education  2017 [employer].
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
## 2016-12-11 NOTE — Progress Notes (Signed)
HPI:  Logan Sanchez is a pleasant 46 year old with a past medical history significant for morbid obesity, coronary artery disease status post cardiac cath in 2017 with drug-eluting stent 2, hypertension, hyperlipidemia, alcohol abuse, depression, anxiety, prior tobacco use and a history of poor compliance here for follow-up. Reports he is doing quite well. Reports he has been trying to eat a healthier diet and get some exercise. He did eat a hamburger today and he thinks this is why his blood pressure is elevated. He reports he has cut back on his alcohol to one large beer 2-3 times per month. He feels his mood has been much improved on the Paxil. He is not entirely sure that he is taking all of his pressure medicines. He is using a pillbox, but he is not sure what each pill is for currently is taking all the pills on his list. He denies chest pain, shortness of breath, swelling, palpitations, depression or bleeding. His cardiologist is Dr. Eldridge DaceVaranasi. Recent notes reviewed and appreciated care. His amlodipine was increased. We increased his statin after his last lab check, he is due for recheck.   ROS: See pertinent positives and negatives per HPI.  Past Medical History:  Diagnosis Date  . Alcohol abuse   . Angina pectoris (HCC) 12/09/2015  . Brain injury [facility](HCC)    report on disability for this, sees Dr. Arvilla MarketShwartz  . Coronary artery disease    a. cath 12/11/15 99% stenosed mild LAD s/p overlapping drug-eluting stents; mid Cx 80% stenosed s/p Synergy drug-eluting stent; pro RCA 70% - medical managment for now, could consider cath  . Depression   . Elevated LFTs   . Erectile dysfunction   . Hyperlipemia   . Hypertension   . Mood disorder (HCC)    depression and GAD  . Obesity   . Rheumatoid arthritis involving right hip [facility](HCC)     Past Surgical History:  Procedure Laterality Date  . CARDIAC CATHETERIZATION N/A 12/11/2015   Procedure: Left Heart Cath and Coronary Angiography;  Surgeon: Corky CraftsJayadeep  S Varanasi, MD;  [facility];  Service: Cardiovascular;  Laterality: N/A;  . CARDIAC CATHETERIZATION N/A 12/11/2015   Procedure: Coronary Stent Intervention;  Surgeon: Corky CraftsJayadeep S Varanasi, MD;  [facility];  Service: Cardiovascular;  Laterality: N/A;  . CORONARY ANGIOPLASTY    . FOREARM FRACTURE SURGERY Right ~ 1998   "I've got a metal rod in it"  . FRACTURE SURGERY      Family History  Problem Relation Age of Onset  . Hypertension Mother   . Hypertension Father   . Heart attack Maternal Grandmother   . Hypertension Maternal Grandmother   . Stroke Maternal Grandmother   . Heart attack Maternal Grandfather   . Hypertension Maternal Grandfather   . Hypertension Paternal Grandmother   . Hypertension Paternal Grandfather     Social History   Social History  . Marital status: Single    Spouse name: N/A  . Number of children: N/A  . Years of education: N/A   Social History Main Topics  . Smoking status: Former Smoker    Packs/day: 1.50    Years: 25.00    Types: Cigarettes    Quit date: 09/30/2014  . Smokeless tobacco: Never Used  . Alcohol use 45.6 oz/week    23 Cans of beer, 53 Shots of liquor per week     Comment: 12/11/2015 "I drink at least 1 40oz beer/day plus a pint of liquor 5 times/week"  .  Drug use: No  . Sexual activity: Not Currently   Other Topics Concern  . None   Social History Narrative   Work or School: on disability for brain injury      Home Situation:       Spiritual Beliefs:      Lifestyle: no regular exercise, poor diet           Current Outpatient Prescriptions:  .  amLODipine (NORVASC) 10 MG tablet, Take 1 tablet (10 mg total) by mouth daily., Disp: 90 tablet, Rfl: 3 .  aspirin EC 81 MG tablet, Take 1 tablet (81 mg total) by mouth daily., Disp: 90 tablet, Rfl: 3 .  atorvastatin (LIPITOR) 20 MG tablet, Take 1 tablet (20 mg total) by mouth daily., Disp: 90 tablet, Rfl: 0 .  clopidogrel (PLAVIX) 75 MG tablet,  Take 1 tablet (75 mg total) by mouth daily with breakfast., Disp: 30 tablet, Rfl: 11 .  fish oil-omega-3 fatty acids 1000 MG capsule, Take 1 g by mouth daily., Disp: , Rfl:  .  Ginkgo Biloba (GNP GINGKO BILOBA EXTRACT PO), Take 1 capsule by mouth daily., Disp: , Rfl:  .  isosorbide mononitrate (IMDUR) 30 MG 24 hr tablet, Take 1 tablet (30 mg total) by mouth daily., Disp: 30 tablet, Rfl: 11 .  losartan-hydrochlorothiazide (HYZAAR) 100-25 MG tablet, Take 1 tablet by mouth daily., Disp: 90 tablet, Rfl: 1 .  metoprolol tartrate (LOPRESSOR) 25 MG tablet, Take 25 mg by mouth daily., Disp: , Rfl:  .  Misc Natural Products (GINSENG COMPLEX PO), Take 1 tablet by mouth daily., Disp: , Rfl:  .  nitroGLYCERIN (NITROSTAT) 0.4 MG SL tablet, Place 1 tablet (0.4 mg total) under the tongue every 5 (five) minutes as needed for chest pain., Disp: 25 tablet, Rfl: 3 .  pantoprazole (PROTONIX) 40 MG tablet, Take 1 tablet (40 mg total) by mouth daily., Disp: 30 tablet, Rfl: 11 .  PARoxetine (PAXIL) 20 MG tablet, Take 1 tablet (20 mg total) by mouth daily., Disp: 90 tablet, Rfl: 1  EXAM:  Vitals:   12/11/16 1301  BP: 132/88  Pulse: 100  Temp: 98.8 F (37.1 C)    Body mass index is 43.11 kg/m.  GENERAL: vitals reviewed and listed above, alert, oriented, appears well hydrated and in no acute distress  HEENT: atraumatic, conjunttiva clear, no obvious abnormalities on inspection of external nose and ears  NECK: no obvious masses on inspection  LUNGS: clear to auscultation bilaterally, no wheezes, rales or rhonchi, good air movement  CV: HRRR, no peripheral edema  MS: moves all extremities without noticeable abnormality  PSYCH: pleasant and cooperative, no obvious depression or anxiety  ASSESSMENT AND PLAN:  Discussed the following assessment and plan:  Medicare annual wellness visit, subsequent  Recurrent major depressive disorder, in full remission (HCC)  Morbid obesity (HCC)  Mixed  hyperlipidemia - Plan: Lipid Panel  Essential hypertension - Plan: Basic metabolic panel, CBC (no diff)  Coronary artery disease involving native coronary artery of native heart without angina pectoris  Alcohol abuse  Former smoker  -Lifestyle recs, counseling on a healthy lifestyle -Advised to quit drinking, congratulated him on the changes he has made -Labs - advised to schedule fasting lab visit -Printed his med list and wrote what each medication was for - advised him to ensure taking all meds and he plans to check with the list and let us know -He is seeing our Health Coach for a Wellness visit today  -Patient advised to return or notify  a doctor immediately if symptoms worsen or persist or new concerns arise.  Patient Instructions  BEFORE YOU LEAVE: -Medicare Wellness Exam -follow up: 1) Fasting lab visit in next 1 week 2) Follow up with Dr. Selena Batten in 3-4 months  When you get home, make sure you are taking all medications. Let us know if you are out of anything. Request and updated medication list every time you see a doctor and then check your pill box each week with the list.   We recommend the following healthy lifestyle for LIFE: 1) Small portions.   Tip: eat off of a salad plate instead of a dinner plate.  Tip: if you need more or a snack choose fruits, veggies and/or a handful of nuts or seeds.  2) Eat a healthy clean diet.  * Tip: Avoid (less then 1 serving per week): processed foods, sweets, sweetened drinks, white starches (rice, flour, bread, potatoes, pasta, etc), red meat, fast foods, butter  *Tip: CHOOSE instead   * 5-9 servings per day of fresh or frozen fruits and vegetables (but not corn, potatoes, bananas, canned or dried fruit)   *nuts and seeds, beans   *olives and olive oil   *small portions of lean meats such as fish and white chicken    *small portions of whole grains  3)Get at least 150 minutes of sweaty aerobic exercise per week.  4)Reduce stress  - consider counseling, meditation and relaxation to balance other aspects of your life.     Kriste Basque R., DO

## 2016-12-11 NOTE — Progress Notes (Signed)
Pre visit review using our clinic review tool, if applicable. No additional management support is needed unless otherwise documented below in the visit note. 

## 2016-12-15 ENCOUNTER — Telehealth: Payer: Self-pay | Admitting: Interventional Cardiology

## 2016-12-15 NOTE — Telephone Encounter (Signed)
Patient called to give his most recent BP reading. BP: 132/88. Patient states that he has been taking his medications as prescribed. I told him that I would let Dr. Eldridge Sanchez know. Patient verbalized understanding.

## 2016-12-15 NOTE — Telephone Encounter (Signed)
Follow Up:   Please call,pt needs to give you an update on his blood pressure readings.

## 2016-12-17 NOTE — Telephone Encounter (Signed)
Excellent. COntinue curent meds.

## 2016-12-24 NOTE — Telephone Encounter (Signed)
**Note De-Identified Christan Ciccarelli Obfuscation** The pt is advised of Dr Hoyle BarrVaranasi's recommendation and he verbalized understanding.

## 2017-01-17 ENCOUNTER — Other Ambulatory Visit: Payer: Self-pay | Admitting: Physician Assistant

## 2017-01-25 ENCOUNTER — Other Ambulatory Visit: Payer: Self-pay | Admitting: Interventional Cardiology

## 2017-01-25 DIAGNOSIS — I209 Angina pectoris, unspecified: Secondary | ICD-10-CM

## 2017-03-11 NOTE — Progress Notes (Deleted)
HPI:  Due for tetanus booster, HIV screen,   CAD/HTN/HLD: -hx tobacco use -s/p cath in 2017 with DES x2 -sees Dr. Justice Deeds (cardiology) - appreciate care -meds:asa, plavix, lipitor, amlodipine, losartan-hctz, isosorbide, lopressor, fish oil  Morbid Obesity/Prediabetes:  GERD: -meds: protonix  daily  Depression/Anxiety/Alcohol abuse: -had cut back sig on alcohol last visit -on wellbutrin in the past -meds: paxil    ROS: See pertinent positives and negatives per HPI.  Past Medical History:  Diagnosis Date  . Alcohol abuse   . Angina pectoris (HCC) 12/09/2015  . Brain injury [facility])    report on disability for this, sees Dr. Arvilla Market  . Coronary artery disease    a. cath 12/11/15 99% stenosed mild LAD s/p overlapping drug-eluting stents; mid Cx 80% stenosed s/p Synergy drug-eluting stent; pro RCA 70% - medical managment for now, could consider cath  . Depression   . Elevated LFTs   . Erectile dysfunction   . Hyperlipemia   . Hypertension   . Mood disorder (HCC)    depression and GAD  . Obesity   . Rheumatoid arthritis involving right hip [facility])     Past Surgical History:  Procedure Laterality Date  . CARDIAC CATHETERIZATION N/A 12/11/2015   Procedure: Left Heart Cath and Coronary Angiography;  Surgeon: Corky Crafts, MD;  Location: [facility] INVAS[facility] CV LAB;  Service: Cardiovascular;  Laterality: N/A;  . CARDIAC CATHETERIZATION N/A 12/11/2015   Procedure: Coronary Stent Intervention;  Surgeon: Corky Crafts, MD;  Location: [facility] INVASIVE CV LAB;  Service: Cardiovascular;  Laterality: N/A;  . CORONARY ANGIOPLASTY    . FOREARM FRACTURE SURGERY Right ~ 1998   "I've got a metal rod in it"  . FRACTURE SURGERY      Family History  Problem Relation Age of Onset  . Hypertension Mother   . Hypertension Father   . Heart attack Maternal Grandmother   . Hypertension Maternal Grandmother   . Stroke Maternal Grandmother   . Heart attack Maternal Grandfather   .  Hypertension Maternal Grandfather   . Hypertension Paternal Grandmother   . Hypertension Paternal Grandfather     Social History   Social History  . Marital status: Single    Spouse name: N/A  . Number of children: N/A  . Years of education: N/A   Social History Main Topics  . Smoking status: Former Smoker    Packs/day: 1.50    Years: 25.00    Types: Cigarettes    Quit date: 09/30/2014  . Smokeless tobacco: Never Used  . Alcohol use 45.6 oz/week    23 Cans of beer, 53 Shots of liquor per week     Comment: 12/11/2015 "I drink at least 1 40oz beer/day plus a pint of liquor 5 times/week"  . Drug use: No  . Sexual activity: Not Currently   Other Topics Concern  . Not on file   Social History Narrative   Work or School: on disability for brain injury      Home Situation:       Spiritual Beliefs:      Lifestyle: no regular exercise, poor diet           Current Outpatient Prescriptions:  .  amLODipine (NORVASC) 10 MG tablet, Take 1 tablet (10 mg total) by mouth daily., Disp: 90 tablet, Rfl: 3 .  aspirin EC 81 MG tablet, Take 1 tablet (81 mg total) by mouth daily., Disp: 90 tablet, Rfl: 3 .  atorvastatin (LIPITOR) 20 MG tablet, Take 1 tablet (20  mg total) by mouth daily., Disp: 90 tablet, Rfl: 0 .  clopidogrel (PLAVIX) 75 MG tablet, TAKE 1 TABLET (75 MG TOTAL) BY MOUTH DAILY WITH BREAKFAST., Disp: 30 tablet, Rfl: 8 .  fish oil-omega-3 fatty acids 1000 MG capsule, Take 1 g by mouth daily., Disp: , Rfl:  .  Ginkgo Biloba (GNP GINGKO BILOBA EXTRACT PO), Take 1 capsule by mouth daily., Disp: , Rfl:  .  isosorbide mononitrate (IMDUR) 30 MG 24 hr tablet, TAKE 1 TABLET (30 MG TOTAL) BY MOUTH DAILY., Disp: 30 tablet, Rfl: 8 .  losartan-hydrochlorothiazide (HYZAAR) 100-25 MG tablet, Take 1 tablet by mouth daily., Disp: 90 tablet, Rfl: 1 .  metoprolol tartrate (LOPRESSOR) 25 MG tablet, Take 25 mg by mouth daily., Disp: , Rfl:  .  Misc Natural Products (GINSENG COMPLEX PO), Take 1  tablet by mouth daily., Disp: , Rfl:  .  nitroGLYCERIN (NITROSTAT) 0.4 MG SL tablet, Place 1 tablet (0.4 mg total) under the tongue every 5 (five) minutes as needed for chest pain., Disp: 25 tablet, Rfl: 3 .  pantoprazole (PROTONIX) 40 MG tablet, Take 1 tablet (40 mg total) by mouth daily., Disp: 30 tablet, Rfl: 11 .  PARoxetine (PAXIL) 20 MG tablet, Take 1 tablet (20 mg total) by mouth daily., Disp: 90 tablet, Rfl: 1  EXAM:  There were no vitals filed for this visit.  There is no height or weight on file to calculate BMI.  GENERAL: vitals reviewed and listed above, alert, oriented, appears well hydrated and in no acute distress  HEENT: atraumatic, conjunttiva clear, no obvious abnormalities on inspection of external nose and ears  NECK: no obvious masses on inspection  LUNGS: clear to auscultation bilaterally, no wheezes, rales or rhonchi, good air movement  CV: HRRR, no peripheral edema  MS: moves all extremities without noticeable abnormality  PSYCH: pleasant and cooperative, no obvious depression or anxiety  ASSESSMENT AND PLAN:  Discussed the following assessment and plan:  No diagnosis found.  -Patient advised to return or notify a doctor immediately if symptoms worsen or persist or new concerns arise.  There are no Patient Instructions on file for this visit.  Kriste Basque R., DO

## 2017-03-12 ENCOUNTER — Telehealth: Payer: Self-pay | Admitting: *Deleted

## 2017-03-12 ENCOUNTER — Ambulatory Visit: Payer: Self-pay | Admitting: Family Medicine

## 2017-03-12 NOTE — Telephone Encounter (Signed)
Patient was a no-show for today's appt.  I called the pt check on him since he missed the appt and asked that he call back to reschedule.

## 2017-03-16 ENCOUNTER — Ambulatory Visit (INDEPENDENT_AMBULATORY_CARE_PROVIDER_SITE_OTHER): Payer: Medicare HMO | Admitting: Family Medicine

## 2017-03-16 ENCOUNTER — Encounter: Payer: Self-pay | Admitting: Family Medicine

## 2017-03-16 VITALS — BP 138/80 | HR 90 | Temp 98.2°F | Ht 69.0 in | Wt 285.0 lb

## 2017-03-16 DIAGNOSIS — F3342 Major depressive disorder, recurrent, in full remission: Secondary | ICD-10-CM

## 2017-03-16 DIAGNOSIS — S069X9S Unspecified intracranial injury with loss of consciousness of unspecified duration, sequela: Secondary | ICD-10-CM | POA: Diagnosis not present

## 2017-03-16 DIAGNOSIS — R739 Hyperglycemia, unspecified: Secondary | ICD-10-CM | POA: Insufficient documentation

## 2017-03-16 DIAGNOSIS — E785 Hyperlipidemia, unspecified: Secondary | ICD-10-CM | POA: Diagnosis not present

## 2017-03-16 DIAGNOSIS — I2510 Atherosclerotic heart disease of native coronary artery without angina pectoris: Secondary | ICD-10-CM

## 2017-03-16 DIAGNOSIS — Z23 Encounter for immunization: Secondary | ICD-10-CM | POA: Diagnosis not present

## 2017-03-16 DIAGNOSIS — I10 Essential (primary) hypertension: Secondary | ICD-10-CM | POA: Diagnosis not present

## 2017-03-16 LAB — LIPID PANEL
CHOLESTEROL: 232 mg/dL — AB (ref 0–200)
HDL: 36.5 mg/dL — AB (ref >39.00)
TRIGLYCERIDES: 685 mg/dL — AB (ref 0.0–149.0)
Total CHOL/HDL Ratio: 6

## 2017-03-16 LAB — HEMOGLOBIN A1C: HEMOGLOBIN A1C: 5.6 % (ref 4.6–6.5)

## 2017-03-16 LAB — BASIC METABOLIC PANEL
BUN: 10 mg/dL (ref 6–23)
CO2: 23 mEq/L (ref 19–32)
Calcium: 9.2 mg/dL (ref 8.4–10.5)
Chloride: 105 mEq/L (ref 96–112)
Creatinine, Ser: 0.95 mg/dL (ref 0.40–1.50)
GFR: 110.34 mL/min (ref >60.00)
Glucose, Bld: 107 mg/dL — ABNORMAL HIGH (ref 70–99)
POTASSIUM: 3.5 meq/L (ref 3.5–5.1)
SODIUM: 138 meq/L (ref 135–145)

## 2017-03-16 LAB — CBC
HCT: 41.4 % (ref 39.0–52.0)
HEMOGLOBIN: 14 g/dL (ref 13.0–17.0)
MCHC: 33.8 g/dL (ref 30.0–36.0)
MCV: 95.1 fl (ref 78.0–100.0)
Platelets: 261 10*3/uL (ref 150.0–400.0)
RBC: 4.35 Mil/uL (ref 4.22–5.81)
RDW: 15.1 % (ref 11.5–15.5)
WBC: 6.5 10*3/uL (ref 4.0–10.5)

## 2017-03-16 LAB — LDL CHOLESTEROL, DIRECT: Direct LDL: 67 mg/dL

## 2017-03-16 NOTE — Addendum Note (Signed)
Addended by: Johnella Moloney on: 03/16/2017 01:57 PM   Modules accepted: Orders

## 2017-03-16 NOTE — Progress Notes (Signed)
Pre visit review using our clinic review tool, if applicable. No additional management support is needed unless otherwise documented below in the visit note. 

## 2017-03-16 NOTE — Progress Notes (Signed)
HPI:  PMH morbid obesity, CAD s/p DES x2 in 2017, HTN, HLD, hx brain injury - on disability, alcohol abuse, depression, anxiety, prior tobacco use here for follow up.  Reports has been feeling great. Not exercising [facility] as he did not realize he had to pay for it. Reports his diet is better. Reports is "doing great" in terms of her alcohol use and has cut back to 1 beer per week. He recently started several new OTC supplements from [employer] including testosterone and bee pollen for energy. Had not felt low in energy - just thought this would be good since he is a man. Wonders if this caused elevated BP today. Agrees to hold supplements and reports he has follow up with his cardiologist in a few weeks and can recheck BP then. Saw on social media where a girl had a bad reaction to losartan - wonders if he should stop losartan. Reports is taking his medications daily currently.  Denies CP, SOB, DOE, HA, swelling, palpitations, depressed mood. Due for labs, tetanus booster.  ROS: See pertinent positives and negatives per HPI.  Past Medical History:  Diagnosis Date  . Alcohol abuse   . Angina pectoris (HCC) 12/09/2015  . Brain injury [facility])    report on disability for this, sees Dr. Arvilla Market  . Coronary artery disease    a. cath 12/11/15 99% stenosed mild LAD s/p overlapping drug-eluting stents; mid Cx 80% stenosed s/p Synergy drug-eluting stent; pro RCA 70% - medical managment for now, could consider cath  . Depression   . Elevated LFTs   . Erectile dysfunction   . Hyperlipemia   . Hypertension   . Mood disorder (HCC)    depression and GAD  . Obesity   . Rheumatoid arthritis involving right hip [facility])     Past Surgical History:  Procedure Laterality Date  . CARDIAC CATHETERIZATION N/A 12/11/2015   Procedure: Left Heart Cath and Coronary Angiography;  Surgeon: Corky Crafts, MD;  Location: [facility] INVASIVE CV LAB;  Service:  Cardiovascular;  Laterality: N/A;  . CARDIAC CATHETERIZATION N/A 12/11/2015   Procedure: Coronary Stent Intervention;  Surgeon: Corky Crafts, MD;  Location: [facility] INVASIVE CV LAB;  Service: Cardiovascular;  Laterality: N/A;  . CORONARY ANGIOPLASTY    . FOREARM FRACTURE SURGERY Right ~ 1998   "I've got a metal rod in it"  . FRACTURE SURGERY      Family History  Problem Relation Age of Onset  . Hypertension Mother   . Hypertension Father   . Heart attack Maternal Grandmother   . Hypertension Maternal Grandmother   . Stroke Maternal Grandmother   . Heart attack Maternal Grandfather   . Hypertension Maternal Grandfather   . Hypertension Paternal Grandmother   . Hypertension Paternal Grandfather     Social History   Social History  . Marital status: Single    Spouse name: N/A  . Number of children: N/A  . Years of education: N/A   Social History Main Topics  . Smoking status: Former Smoker    Packs/day: 1.50    Years: 25.00    Types: Cigarettes    Quit date: 09/30/2014  . Smokeless tobacco: Never Used  . Alcohol use 45.6 oz/week    23 Cans of beer, 53 Shots of liquor per week     Comment: 12/11/2015 "I drink at least 1 40oz beer/day plus a pint of liquor 5 times/week"  .  Drug use: No  . Sexual activity: Not Currently   Other Topics Concern  . None   Social History Narrative   Work or School: on disability for brain injury      Home Situation:       Spiritual Beliefs:      Lifestyle: no regular exercise, poor diet           Current Outpatient Prescriptions:  .  amLODipine (NORVASC) 10 MG tablet, Take 1 tablet (10 mg total) by mouth daily., Disp: 90 tablet, Rfl: 3 .  aspirin EC 81 MG tablet, Take 1 tablet (81 mg total) by mouth daily., Disp: 90 tablet, Rfl: 3 .  atorvastatin (LIPITOR) 20 MG tablet, Take 1 tablet (20 mg total) by mouth daily., Disp: 90 tablet, Rfl: 0 .  clopidogrel (PLAVIX) 75 MG tablet, TAKE 1 TABLET (75 MG TOTAL) BY MOUTH DAILY WITH  BREAKFAST., Disp: 30 tablet, Rfl: 8 .  fish oil-omega-3 fatty acids 1000 MG capsule, Take 1 g by mouth daily., Disp: , Rfl:  .  isosorbide mononitrate (IMDUR) 30 MG 24 hr tablet, TAKE 1 TABLET (30 MG TOTAL) BY MOUTH DAILY., Disp: 30 tablet, Rfl: 8 .  losartan-hydrochlorothiazide (HYZAAR) 100-25 MG tablet, Take 1 tablet by mouth daily., Disp: 90 tablet, Rfl: 1 .  metoprolol tartrate (LOPRESSOR) 25 MG tablet, Take 25 mg by mouth daily., Disp: , Rfl:  .  Misc Natural Products (GINSENG COMPLEX PO), Take 1 tablet by mouth daily., Disp: , Rfl:  .  nitroGLYCERIN (NITROSTAT) 0.4 MG SL tablet, Place 1 tablet (0.4 mg total) under the tongue every 5 (five) minutes as needed for chest pain., Disp: 25 tablet, Rfl: 3 .  pantoprazole (PROTONIX) 40 MG tablet, Take 1 tablet (40 mg total) by mouth daily., Disp: 30 tablet, Rfl: 11 .  PARoxetine (PAXIL) 20 MG tablet, Take 1 tablet (20 mg total) by mouth daily., Disp: 90 tablet, Rfl: 1  EXAM:  Vitals:   03/16/17 1321 03/16/17 1323  BP: 138/80 138/80  Pulse: 90   Temp: 98.2 F (36.8 C)     Body mass index is 42.09 kg/m.  GENERAL: vitals reviewed and listed above, alert, oriented, appears well hydrated and in no acute distress  HEENT: atraumatic, conjunttiva clear, no obvious abnormalities on inspection of external nose and ears  NECK: no obvious masses on inspection  LUNGS: clear to auscultation bilaterally, no wheezes, rales or rhonchi, good air movement  CV: HRRR, no peripheral edema  MS: moves all extremities without noticeable abnormality  PSYCH: pleasant and cooperative, no obvious depression or anxiety  ASSESSMENT AND PLAN:  Discussed the following assessment and plan:  Coronary artery disease involving native coronary artery of native heart without angina pectoris  Essential hypertension - Plan: Basic metabolic panel, CBC  Recurrent major depressive disorder, in full remission (HCC)  Morbid obesity (HCC)  Brain injury with loss of  consciousness, sequela (HCC)  Hyperglycemia - Plan: Hemoglobin A1c  Hyperlipidemia, unspecified hyperlipidemia type - Plan: Lipid panel  -discussed risks/benefits supplements and medications -advised to stop supplements for now, continue losartan since he has tolerated well and recheck BP at cardiology follow up - it was better here after sitting for 5 mintues -lifestyle recs, he is considering joining a different [employer] -congratulated on cutting back on alcohol, advised to continue to refrain -labs -Patient advised to return or notify a doctor immediately if symptoms worsen or persist or new concerns arise.  Patient Instructions  BEFORE YOU LEAVE: -follow up: 3 months -labs -tetanus  booster  Start getting regular exercise - gradually increase.  Stop supplements.  See your cardiologist in May as planned.  We have ordered labs or studies at this visit. It can take up to 1-2 weeks for results and processing. IF results require follow up or explanation, we will call you with instructions. Clinically stable results will be released to your [facility]. If you have not heard from [place] or cannot find your results in [facility] in 2 weeks please contact our office at [phone].  If you are not yet signed up for [facility], please consider signing up.  We recommend the following healthy lifestyle for LIFE: 1) Small portions.   Tip: eat off of a salad plate instead of a dinner plate.  Tip: It is ok to feel hungry after a meal.  Tip: if you need more or a snack choose fruits, veggies and/or a handful of nuts or seeds.  2) Eat a healthy clean diet.  * Tip: Avoid (less then 1 serving per week): processed foods, sweets, sweetened drinks, white starches (rice, flour, bread, potatoes, pasta, etc), red meat, fast foods, butter  *Tip: CHOOSE instead   * 5-9 servings per day of fresh or frozen fruits and vegetables (but not corn, potatoes, bananas, canned or dried fruit)   *nuts and seeds, beans   *olives and  olive oil   *small portions of lean meats such as fish and white chicken    *small portions of whole grains  3)Get at least 150 minutes of sweaty aerobic exercise per week.  4)Reduce stress - consider counseling, meditation and relaxation to balance other aspects of your life.     Kriste Basque R., DO

## 2017-03-16 NOTE — Patient Instructions (Signed)
BEFORE YOU LEAVE: -follow up: 3 months -labs -tetanus booster  Start getting regular exercise - gradually increase.  Stop supplements.  See your cardiologist in May as planned.  We have ordered labs or studies at this visit. It can take up to 1-2 weeks for results and processing. IF results require follow up or explanation, we will call you with instructions. Clinically stable results will be released to your [facility]. If you have not heard from [place] or cannot find your results in [facility] in 2 weeks please contact our office at [phone].  If you are not yet signed up for [facility], please consider signing up.  We recommend the following healthy lifestyle for LIFE: 1) Small portions.   Tip: eat off of a salad plate instead of a dinner plate.  Tip: It is ok to feel hungry after a meal.  Tip: if you need more or a snack choose fruits, veggies and/or a handful of nuts or seeds.  2) Eat a healthy clean diet.  * Tip: Avoid (less then 1 serving per week): processed foods, sweets, sweetened drinks, white starches (rice, flour, bread, potatoes, pasta, etc), red meat, fast foods, butter  *Tip: CHOOSE instead   * 5-9 servings per day of fresh or frozen fruits and vegetables (but not corn, potatoes, bananas, canned or dried fruit)   *nuts and seeds, beans   *olives and olive oil   *small portions of lean meats such as fish and white chicken    *small portions of whole grains  3)Get at least 150 minutes of sweaty aerobic exercise per week.  4)Reduce stress - consider counseling, meditation and relaxation to balance other aspects of your life.

## 2017-03-30 ENCOUNTER — Encounter: Payer: Self-pay | Admitting: Interventional Cardiology

## 2017-04-15 ENCOUNTER — Ambulatory Visit (INDEPENDENT_AMBULATORY_CARE_PROVIDER_SITE_OTHER): Payer: Medicare HMO | Admitting: Interventional Cardiology

## 2017-04-15 ENCOUNTER — Encounter: Payer: Self-pay | Admitting: Interventional Cardiology

## 2017-04-15 DIAGNOSIS — I25119 Atherosclerotic heart disease of native coronary artery with unspecified angina pectoris: Secondary | ICD-10-CM | POA: Diagnosis not present

## 2017-04-15 DIAGNOSIS — I10 Essential (primary) hypertension: Secondary | ICD-10-CM | POA: Diagnosis not present

## 2017-04-15 DIAGNOSIS — Z87891 Personal history of nicotine dependence: Secondary | ICD-10-CM | POA: Diagnosis not present

## 2017-04-15 NOTE — Progress Notes (Signed)
Patient ID: Logan Sanchez, male   DOB: 1971/11/12, 46 y.o.   MRN: [id number]     Cardiology Office Note   Date:  04/15/2017   ID:  Logan Sanchez, DOB Jul 13, 1971, MRN [id number]  PCP:  Logan Koyanagi, DO    No chief complaint on file. f/u CAD   Wt Readings from Last 3 Encounters:  04/15/17 280 lb 12.8 oz (127.4 kg)  03/16/17 285 lb (129.3 kg)  12/11/16 291 lb 14.4 oz (132.4 kg)       History of Present Illness: Logan Sanchez is a 46 y.o. male  Who has CAD.  He had a cath in Jan 2017.  Cath showed 99% stenosed mild LAD s/p overlapping drug-eluting stents; mid Cx 80% stenosed s/p Synergy drug-eluting stent. DAPT for at least year but probably indefinite clopidogrel given diffuse coronary disease. 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 No angina so he has been medically managed.  Alcohol use has been an issue in the past.  He is drinking less.  He has decreased  fried foods as well. He is walking more, trying to lose weight.  He has lost 10 lbs in 2018.  No bleeding problems on DAPT.  He does take the Plavix regularly.    He has missed some BP meds recently.  BP has been high recently.  Better wen he takes his meds.   Logan Sanchez work is his most strenuous exercise.  He tries walk for exercise 3 days/week.    On disability from a prior head injury.     Past Medical History:  Diagnosis Date  . Alcohol abuse   . Angina pectoris (HCC) 12/09/2015  . Brain injury [facility])    report on disability for this, sees Dr. Arvilla Sanchez  . Coronary artery disease    a. cath 12/11/15 99% stenosed mild LAD s/p overlapping drug-eluting stents; mid Cx 80% stenosed s/p Synergy drug-eluting stent; pro RCA 70% - medical managment for now, could consider cath  . Depression   . Elevated LFTs   . Erectile dysfunction   . Hyperlipemia   . Hypertension   . Mood disorder (HCC)    depression and GAD  .  Obesity   . Rheumatoid arthritis involving right hip [facility])     Past Surgical History:  Procedure Laterality Date  . CARDIAC CATHETERIZATION N/A 12/11/2015   Procedure: Left Heart Cath and Coronary Angiography;  Surgeon: Logan Crafts, MD;  Location: [facility] INVAS[facility] CV LAB;  Service: Cardiovascular;  Laterality: N/A;  . CARDIAC CATHETERIZATION N/A 12/11/2015   Procedure: Coronary Stent Intervention;  Surgeon: Logan Crafts, MD;  Location: [facility] INVASIVE CV LAB;  Service: Cardiovascular;  Laterality: N/A;  . CORONARY ANGIOPLASTY    . FOREARM FRACTURE SURGERY Right ~ 1998   "I've got a metal rod in it"  . FRACTURE SURGERY       Current Outpatient Prescriptions  Medication Sig Dispense Refill  . amLODipine (NORVASC) 10 MG tablet Take 1 tablet (10 mg total) by mouth daily. 90 tablet 3  . aspirin EC 81 MG tablet Take 1 tablet (81 mg total) by mouth daily. 90 tablet 3  . atorvastatin (LIPITOR) 20 MG tablet Take 1 tablet (20 mg total) by mouth daily. 90 tablet 0  . clopidogrel (PLAVIX) 75 MG tablet TAKE 1 TABLET (75 MG TOTAL) BY MOUTH DAILY WITH BREAKFAST. 30 tablet 8  . fish oil-omega-3 fatty acids  1000 MG capsule Take 1 g by mouth daily.    . isosorbide mononitrate (IMDUR) 30 MG 24 hr tablet TAKE 1 TABLET (30 MG TOTAL) BY MOUTH DAILY. 30 tablet 8  . losartan-hydrochlorothiazide (HYZAAR) 100-25 MG tablet Take 1 tablet by mouth daily. 90 tablet 1  . metoprolol tartrate (LOPRESSOR) 25 MG tablet Take 25 mg by mouth daily.    . Misc Natural Products (GINSENG COMPLEX PO) Take 1 tablet by mouth daily.    . nitroGLYCERIN (NITROSTAT) 0.4 MG SL tablet Place 1 tablet (0.4 mg total) under the tongue every 5 (five) minutes as needed for chest pain. 25 tablet 3  . pantoprazole (PROTONIX) 40 MG tablet Take 1 tablet (40 mg total) by mouth daily. 30 tablet 11  . PARoxetine (PAXIL) 20 MG tablet Take 1 tablet (20 mg total) by mouth daily. 90 tablet 1   No current facility-administered medications for this  visit.     Allergies:   Patient has no known allergies.    Social History:  The patient  reports that he quit smoking about 2 years ago. His smoking use included Cigarettes. He has a 37.50 pack-year smoking history. He has never used smokeless tobacco. He reports that he drinks about 45.6 oz of alcohol per week . He reports that he does not use drugs.   Family History:  The patient's family history includes Heart attack in his maternal grandfather and maternal grandmother; Hypertension in his father, maternal grandfather, maternal grandmother, mother, paternal grandfather, and paternal grandmother; Stroke in his maternal grandmother.    ROS:  Please see the history of present illness.   Otherwise, review of systems are positive for intentional weight loss.   All other systems are reviewed and negative.    PHYSICAL EXAM: VS:  BP 138/78   Pulse 96   Ht 5\' 8"  (1.727 m)   Wt 280 lb 12.8 oz (127.4 kg)   SpO2 96%   BMI 42.70 kg/m  , BMI Body mass index is 42.7 kg/m. GEN: Well nourished, well developed, in no acute distress  HEENT: normal  Neck: no JVD, carotid bruits, or masses Cardiac: RRR; no murmurs, rubs, or gallops,no edema  Respiratory:  clear to auscultation bilaterally, normal work of breathing GI: soft, nontender, nondistended, + BS, obese MS: no deformity or atrophy  Skin: warm and dry, no rash Neuro:  Strength and sensation are intact Psych: euthymic mood, full affect    Recent Labs: 08/14/2016: ALT 28 03/16/2017: BUN 10; Creatinine, Ser 0.95; Hemoglobin 14.0; Platelets 261.0; Potassium 3.5; Sodium 138   Lipid Panel    Component Value Date/Time   CHOL 232 (H) 03/16/2017 1359   TRIG 685.0 (H) 03/16/2017 1359   HDL 36.50 (L) 03/16/2017 1359   CHOLHDL 6 03/16/2017 1359   VLDL 69.8 (H) 08/14/2016 1005   LDLCALC 112 (H) 02/19/2014 1542   LDLDIRECT 67.0 03/16/2017 1359     Other studies Reviewed: Additional studies/ records that were reviewed today with results  demonstrating: cath records reviewed.   ASSESSMENT AND PLAN:  1. CAD : s/p PCI of LAD and circ.  Moderate disease in the RCA.  Continue to decrease fatty food intake.  He eats out less than before. No angina on medical therapy. 2. Alcohol abuse: He has cut back on alcohol use.  His father told me the patient drinks a lot while the patient was in the hospital. I encouraged him to avoid alcohol consumption, particularly due to his antiplatelet therapy.  3. HTN:  Continue  current meds.  He will check his BP at the pharmacy .  He can call the office with readings.  It has been in the 140/90 range. Should improve with weight loss.   4. Hyperlipidemia:  Changed pravastatin to atorvastatin 10 mg daily.  Subsequently increased to 20 mg.  LDL in 4/18 67.  At goal. 5. Former smoker: He quit in September of 2015. 6. Obesity: Stop drinking sodas.  Too much sugar and calorie content.  THis will help weight loss.  He will try to lose 10 - 15 lbs over the next 6 months.    Current medicines are reviewed at length with the patient today.  The patient concerns regarding his medicines were addressed.  The following changes have been made:  No change  Labs/ tests ordered today include: lipids in a few months No orders of the defined types were placed in this encounter.   Recommend 150 minutes/week of aerobic exercise Low fat, low carb, high fiber diet recommended  Disposition:   FU in 6 months   Signed, Lance MussJayadeep Ayah Cozzolino, MD  04/15/2017 11:14 AM    [facility] [address], [place], [place]  [address] Phone: [phone]; Fax: [phone]

## 2017-04-15 NOTE — Patient Instructions (Signed)
Medication Instructions:  Your physician recommends that you continue on your current medications as directed. Please refer to the Current Medication list given to you today.   Labwork: None ordered.  Testing/Procedures: None ordered  Follow-Up: Your physician wants you to follow-up in 6 months. You will receive a reminder letter in the mail two months in advance. If you don't receive a letter, please call our office to schedule the follow-up appointment.   Any Other Special Instructions Will Be Listed Below (If Applicable).     If you need a refill on your cardiac medications before your next appointment, please call your pharmacy.   

## 2017-06-14 ENCOUNTER — Other Ambulatory Visit: Payer: Self-pay | Admitting: Physician Assistant

## 2017-06-15 ENCOUNTER — Ambulatory Visit (INDEPENDENT_AMBULATORY_CARE_PROVIDER_SITE_OTHER): Payer: Medicare HMO | Admitting: Family Medicine

## 2017-06-15 ENCOUNTER — Encounter: Payer: Self-pay | Admitting: Family Medicine

## 2017-06-15 DIAGNOSIS — Z1389 Encounter for screening for other disorder: Secondary | ICD-10-CM | POA: Diagnosis not present

## 2017-06-15 DIAGNOSIS — F1010 Alcohol abuse, uncomplicated: Secondary | ICD-10-CM | POA: Diagnosis not present

## 2017-06-15 DIAGNOSIS — F3342 Major depressive disorder, recurrent, in full remission: Secondary | ICD-10-CM | POA: Diagnosis not present

## 2017-06-15 DIAGNOSIS — I25119 Atherosclerotic heart disease of native coronary artery with unspecified angina pectoris: Secondary | ICD-10-CM

## 2017-06-15 DIAGNOSIS — R739 Hyperglycemia, unspecified: Secondary | ICD-10-CM

## 2017-06-15 DIAGNOSIS — Z1331 Encounter for screening for depression: Secondary | ICD-10-CM

## 2017-06-15 DIAGNOSIS — I10 Essential (primary) hypertension: Secondary | ICD-10-CM | POA: Diagnosis not present

## 2017-06-15 NOTE — Progress Notes (Signed)
HPI:  Follow up: Due for CPE/AWV in January Reports doing well. PHQ9 negative. Reports only drinking alcohol a few times per month. Still having issues with taking all meds daily and not using pill box. Had some GERD when was not taking protonix, now resolved as he found bottle under bed and restarted.   CAD, HTN, HLD, Obesity: -sees cardiologist - Dr. Eldridge Dace -statin changed last visit - appreciate recs/care -s/p DES x2 in 2017 -meds: asa, norvasc, lipitor, plavix, fish oil, imdur, losartan-hctz, metoprolol  Hx tobacco use, alcohol abuse, depression and anxiety: -quit smoking -cut back on alcohol -meds: paxil 20mg   GERD: -meds: protonix   ROS: See pertinent positives and negatives per HPI.  Past Medical History:  Diagnosis Date  . Alcohol abuse   . Angina pectoris (HCC) 12/09/2015  . Brain injury [facility])    report on disability for this, sees Dr. Arvilla Market  . Coronary artery disease    a. cath 12/11/15 99% stenosed mild LAD s/p overlapping drug-eluting stents; mid Cx 80% stenosed s/p Synergy drug-eluting stent; pro RCA 70% - medical managment for now, could consider cath  . Depression   . Elevated LFTs   . Erectile dysfunction   . Hyperlipemia   . Hypertension   . Mood disorder (HCC)    depression and GAD  . Obesity   . Rheumatoid arthritis involving right hip [facility])     Past Surgical History:  Procedure Laterality Date  . CARDIAC CATHETERIZATION N/A 12/11/2015   Procedure: Left Heart Cath and Coronary Angiography;  Surgeon: Corky Crafts, MD;  Location: [facility] INVASIVE CV LAB;  Service: Cardiovascular;  Laterality: N/A;  . CARDIAC CATHETERIZATION N/A 12/11/2015   Procedure: Coronary Stent Intervention;  Surgeon: Corky Crafts, MD;  Location: [facility] INVASIVE CV LAB;  Service: Cardiovascular;  Laterality: N/A;  . CORONARY ANGIOPLASTY    . FOREARM FRACTURE SURGERY Right ~ 1998   "I've got a metal rod in it"  . FRACTURE SURGERY      Family History  Problem Relation  Age of Onset  . Hypertension Mother   . Hypertension Father   . Heart attack Maternal Grandmother   . Hypertension Maternal Grandmother   . Stroke Maternal Grandmother   . Heart attack Maternal Grandfather   . Hypertension Maternal Grandfather   . Hypertension Paternal Grandmother   . Hypertension Paternal Grandfather     Social History   Social History  . Marital status: Single    Spouse name: N/A  . Number of children: N/A  . Years of education: N/A   Social History Main Topics  . Smoking status: Former Smoker    Packs/day: 1.50    Years: 25.00    Types: Cigarettes    Quit date: 09/30/2014  . Smokeless tobacco: Never Used  . Alcohol use 45.6 oz/week    23 Cans of beer, 53 Shots of liquor per week     Comment: 12/11/2015 "I drink at least 1 40oz beer/day plus a pint of liquor 5 times/week"  . Drug use: No  . Sexual activity: Not Currently   Other Topics Concern  . None   Social History Narrative   Work or School: on disability for brain injury      Home Situation:       Spiritual Beliefs:      Lifestyle: no regular exercise, poor diet           Current Outpatient Prescriptions:  .  amLODipine (NORVASC) 10 MG tablet, Take 1 tablet (10  mg total) by mouth daily., Disp: 90 tablet, Rfl: 3 .  aspirin EC 81 MG tablet, Take 1 tablet (81 mg total) by mouth daily., Disp: 90 tablet, Rfl: 3 .  atorvastatin (LIPITOR) 20 MG tablet, Take 1 tablet (20 mg total) by mouth daily., Disp: 90 tablet, Rfl: 0 .  clopidogrel (PLAVIX) 75 MG tablet, TAKE 1 TABLET (75 MG TOTAL) BY MOUTH DAILY WITH BREAKFAST., Disp: 30 tablet, Rfl: 8 .  fish oil-omega-3 fatty acids 1000 MG capsule, Take 1 g by mouth daily., Disp: , Rfl:  .  isosorbide mononitrate (IMDUR) 30 MG 24 hr tablet, TAKE 1 TABLET (30 MG TOTAL) BY MOUTH DAILY., Disp: 30 tablet, Rfl: 8 .  losartan-hydrochlorothiazide (HYZAAR) 100-25 MG tablet, Take 1 tablet by mouth daily., Disp: 90 tablet, Rfl: 1 .  metoprolol tartrate (LOPRESSOR)  25 MG tablet, Take 25 mg by mouth daily., Disp: , Rfl:  .  Misc Natural Products (GINSENG COMPLEX PO), Take 1 tablet by mouth daily., Disp: , Rfl:  .  nitroGLYCERIN (NITROSTAT) 0.4 MG SL tablet, Place 1 tablet (0.4 mg total) under the tongue every 5 (five) minutes as needed for chest pain., Disp: 25 tablet, Rfl: 3 .  pantoprazole (PROTONIX) 40 MG tablet, TAKE 1 TABLET (40 MG TOTAL) BY MOUTH DAILY., Disp: 30 tablet, Rfl: 6 .  PARoxetine (PAXIL) 20 MG tablet, Take 1 tablet (20 mg total) by mouth daily., Disp: 90 tablet, Rfl: 1  EXAM:  Vitals:   06/15/17 1120  BP: 122/90  Pulse: 90  Temp: 98.7 F (37.1 C)    Body mass index is 42.98 kg/m.  GENERAL: vitals reviewed and listed above, alert, oriented, appears well hydrated and in no acute distress  HEENT: atraumatic, conjunttiva clear, no obvious abnormalities on inspection of external nose and ears  NECK: no obvious masses on inspection  LUNGS: clear to auscultation bilaterally, no wheezes, rales or rhonchi, good air movement  CV: HRRR, no peripheral edema  MS: moves all extremities without noticeable abnormality  PSYCH: pleasant and cooperative, no obvious depression or anxiety  ASSESSMENT AND PLAN:  Discussed the following assessment and plan:  Morbid obesity (HCC)  Screening for depression  Recurrent major depressive disorder, in full remission (HCC)  Alcohol abuse  Coronary artery disease involving native coronary artery of native heart with angina pectoris (HCC)  Essential hypertension  Hyperglycemia  -BP a little up - better on recheck, medication compliance is an issue -spent about 20 minutes explaining importance of medication adherence and how to use a pill box and his medication list to reduce the chance of medication error and missing doses - he pinky promised to get a pill box today and start doing better with this -lifestyle recs -advised to quit drinking and congratulated on improvements -follow up 3  months -Patient advised to return or notify a doctor immediately if symptoms worsen or persist or new concerns arise.  Patient Instructions  BEFORE YOU LEAVE: -follow up: 3 months  Get a pill box today - one month. Use medication list to fill pill box EVERY month.   We recommend the following healthy lifestyle for LIFE: 1) Small portions.   Tip: eat off of a salad plate instead of a dinner plate.  Tip: It is ok to feel hungry after a meal - that likely means you ate an appropriate portion.  Tip: if you need more or a snack choose fruits, veggies and/or a handful of nuts or seeds.  2) Eat a healthy clean diet.  TRY TO EAT: -at least 5-7 servings of low sugar vegetables per day (not corn, potatoes or bananas.) -berries are the best choice if you wish to eat fruit.   -lean meets (fish, chicken or [place] breasts) -vegan proteins for some meals - beans or tofu, whole grains, nuts and seeds -Replace bad fats with good fats - good fats include: fish, nuts and seeds, canola oil, olive oil -small amounts of low fat or non fat dairy -small amounts of100 % whole grains - check the lables  AVOID: -SUGAR, sweets, anything with added sugar, corn syrup or sweeteners -if you must have a sweetener, small amounts of stevia may be best -sweetened beverages -simple starches (rice, bread, potatoes, pasta, chips, etc - small amounts of 100% whole grains are ok) -red meat, pork, butter -fried foods, fast food, processed food, excessive dairy, eggs and coconut.  3)Get at least 150 minutes of sweaty aerobic exercise per week.  4)Reduce stress - consider counseling, meditation and relaxation to balance other aspects of your life.     Kriste BasqueKIM, Aidden Markovic R., DO

## 2017-06-15 NOTE — Patient Instructions (Signed)
BEFORE YOU LEAVE: -follow up: 3 months  Get a pill box today - one month. Use medication list to fill pill box EVERY month.   We recommend the following healthy lifestyle for LIFE: 1) Small portions.   Tip: eat off of a salad plate instead of a dinner plate.  Tip: It is ok to feel hungry after a meal - that likely means you ate an appropriate portion.  Tip: if you need more or a snack choose fruits, veggies and/or a handful of nuts or seeds.  2) Eat a healthy clean diet.   TRY TO EAT: -at least 5-7 servings of low sugar vegetables per day (not corn, potatoes or bananas.) -berries are the best choice if you wish to eat fruit.   -lean meets (fish, chicken or [place] breasts) -vegan proteins for some meals - beans or tofu, whole grains, nuts and seeds -Replace bad fats with good fats - good fats include: fish, nuts and seeds, canola oil, olive oil -small amounts of low fat or non fat dairy -small amounts of100 % whole grains - check the lables  AVOID: -SUGAR, sweets, anything with added sugar, corn syrup or sweeteners -if you must have a sweetener, small amounts of stevia may be best -sweetened beverages -simple starches (rice, bread, potatoes, pasta, chips, etc - small amounts of 100% whole grains are ok) -red meat, pork, butter -fried foods, fast food, processed food, excessive dairy, eggs and coconut.  3)Get at least 150 minutes of sweaty aerobic exercise per week.  4)Reduce stress - consider counseling, meditation and relaxation to balance other aspects of your life.

## 2017-06-30 ENCOUNTER — Telehealth: Payer: Self-pay | Admitting: Family Medicine

## 2017-06-30 NOTE — Telephone Encounter (Signed)
Patient dropped off Phyician's Statement form Call patient for pick up at [phone] Disposition: Dr's Folder

## 2017-07-01 NOTE — Telephone Encounter (Signed)
I do not see these forms in my office.

## 2017-07-01 NOTE — Telephone Encounter (Signed)
Per Dr Selena BattenKim she needs more information as to the need for disability forms to be completed.  I left a message for the pt to return my call.

## 2017-07-01 NOTE — Telephone Encounter (Signed)
Patient called back and stated his life insurance agent told him if these forms are completed, there is a clause in the insurance plan which states if he is on disability he could have part of the premiums paid.  He stated he cannot work due to a brain problem, mental status and memory issues.  Stated he was seen by Dr Hermelinda MedicusSchwartz for "brain swelling"  and released as he was told there was nothing else he could do to help him.  Message sent to Dr Selena BattenKim.

## 2017-07-02 NOTE — Telephone Encounter (Signed)
Pt will pick up form on Monday .  Pt appreciate everything Dr Selena BattenKim did.

## 2017-07-02 NOTE — Telephone Encounter (Signed)
Please let pt know form done - did the best I could to complete this per his report and remote note with specialist. I have not treated him for the diagnosis that led to his disability, so I am not sure if they will have enough information. Can refer to occupational specialist if he feels he has ongoing disability and more detailed information is needed then we can provide. Thanks.

## 2017-07-02 NOTE — Telephone Encounter (Signed)
I left a message at the pts cell number to return my call-voicemail full at the pts home number.

## 2017-08-18 ENCOUNTER — Telehealth: Payer: Self-pay | Admitting: Nurse Practitioner

## 2017-08-18 NOTE — Telephone Encounter (Signed)
error 

## 2017-08-19 ENCOUNTER — Encounter: Payer: Self-pay | Admitting: Family Medicine

## 2017-09-01 ENCOUNTER — Other Ambulatory Visit: Payer: Self-pay | Admitting: Family Medicine

## 2017-09-23 ENCOUNTER — Ambulatory Visit: Payer: Self-pay | Admitting: Family Medicine

## 2017-10-01 ENCOUNTER — Ambulatory Visit (INDEPENDENT_AMBULATORY_CARE_PROVIDER_SITE_OTHER): Payer: Medicare HMO | Admitting: Family Medicine

## 2017-10-01 ENCOUNTER — Encounter: Payer: Self-pay | Admitting: Family Medicine

## 2017-10-01 DIAGNOSIS — Z87891 Personal history of nicotine dependence: Secondary | ICD-10-CM

## 2017-10-01 DIAGNOSIS — I10 Essential (primary) hypertension: Secondary | ICD-10-CM | POA: Diagnosis not present

## 2017-10-01 DIAGNOSIS — F3342 Major depressive disorder, recurrent, in full remission: Secondary | ICD-10-CM | POA: Diagnosis not present

## 2017-10-01 DIAGNOSIS — L299 Pruritus, unspecified: Secondary | ICD-10-CM | POA: Diagnosis not present

## 2017-10-01 DIAGNOSIS — M25552 Pain in left hip: Secondary | ICD-10-CM | POA: Diagnosis not present

## 2017-10-01 DIAGNOSIS — R739 Hyperglycemia, unspecified: Secondary | ICD-10-CM

## 2017-10-01 DIAGNOSIS — E782 Mixed hyperlipidemia: Secondary | ICD-10-CM | POA: Diagnosis not present

## 2017-10-01 DIAGNOSIS — I25119 Atherosclerotic heart disease of native coronary artery with unspecified angina pectoris: Secondary | ICD-10-CM | POA: Diagnosis not present

## 2017-10-01 DIAGNOSIS — Z23 Encounter for immunization: Secondary | ICD-10-CM

## 2017-10-01 LAB — BASIC METABOLIC PANEL
BUN: 12 mg/dL (ref 6–23)
CHLORIDE: 96 meq/L (ref 96–112)
CO2: 32 meq/L (ref 19–32)
Calcium: 9.7 mg/dL (ref 8.4–10.5)
Creatinine, Ser: 0.9 mg/dL (ref 0.40–1.50)
GFR: 117.15 mL/min (ref >60.00)
Glucose, Bld: 104 mg/dL — ABNORMAL HIGH (ref 70–99)
POTASSIUM: 3.3 meq/L — AB (ref 3.5–5.1)
Sodium: 139 mEq/L (ref 135–145)

## 2017-10-01 LAB — CBC
HEMATOCRIT: 47.2 % (ref 39.0–52.0)
HEMOGLOBIN: 16 g/dL (ref 13.0–17.0)
MCHC: 33.9 g/dL (ref 30.0–36.0)
MCV: 98.8 fl (ref 78.0–100.0)
Platelets: 331 10*3/uL (ref 150.0–400.0)
RBC: 4.78 Mil/uL (ref 4.22–5.81)
RDW: 15.6 % — ABNORMAL HIGH (ref 11.5–15.5)
WBC: 7.4 10*3/uL (ref 4.0–10.5)

## 2017-10-01 LAB — LDL CHOLESTEROL, DIRECT: LDL DIRECT: 177 mg/dL

## 2017-10-01 LAB — LIPID PANEL
CHOL/HDL RATIO: 6
Cholesterol: 252 mg/dL — ABNORMAL HIGH (ref 0–200)
HDL: 41.7 mg/dL (ref >39.00)
NONHDL: 210.73
Triglycerides: 243 mg/dL — ABNORMAL HIGH (ref 0.0–149.0)
VLDL: 48.6 mg/dL — AB (ref 0.0–40.0)

## 2017-10-01 LAB — HEMOGLOBIN A1C: Hgb A1c MFr Bld: 5.9 % (ref 4.6–6.5)

## 2017-10-01 NOTE — Addendum Note (Signed)
Addended by: Johnella MoloneyFUNDERBURK, Kahli Fitzgerald A on: 10/01/2017 10:37 AM   Modules accepted: Orders

## 2017-10-01 NOTE — Patient Instructions (Addendum)
BEFORE YOU LEAVE: -phq9 -labs Ronnald Collum([place], add cholesterol panel IF he is fasting) -flu shot -greater trochanteric bursitis exercises -follow up: 3-4 months  Topical hydrocortisone cream for the itchy spots. Bug repellent outside. Check home if persistent new bites once colder weather or not outside.  Follow up with your cardiologist.  Avoid alcohol.  Take all medications daily.  We have ordered labs or studies at this visit. It can take up to 1-2 weeks for results and processing. IF results require follow up or explanation, we will call you with instructions. Clinically stable results will be released to your [facility]. If you have not heard from us or cannot find your results in [facility] in 2 weeks please contact our office at [phone].  If you are not yet signed [facility] for [facility], please consider signing up.   We recommend the following healthy lifestyle for LIFE: 1) Small portions.   Tip: eat off of a salad plate instead of a dinner plate.  Tip: It is ok to feel hungry after a meal - that likely means you ate an appropriate portion.  Tip: if you need more or a snack choose fruits, veggies and/or a handful of nuts or seeds.  2) Eat a healthy clean diet.   TRY TO EAT: -at least 5-7 servings of low sugar vegetables per day (not corn, potatoes or bananas.) -berries are the best choice if you wish to eat fruit.   -lean meets (fish, chicken or [place] breasts) -vegan proteins for some meals - beans or tofu, whole grains, nuts and seeds -Replace bad fats with good fats - good fats include: fish, nuts and seeds, canola oil, olive oil -small amounts of low fat or non fat dairy -small amounts of100 % whole grains - check the lables  AVOID: -SUGAR, sweets, anything with added sugar, corn syrup or sweeteners -if you must have a sweetener, small amounts of stevia may be best -sweetened beverages -simple starches (rice, bread, potatoes, pasta, chips, etc - small amounts of 100% whole grains  are ok) -red meat, pork, butter -fried foods, fast food, processed food, excessive dairy, eggs and coconut.  3)Get at least 150 minutes of sweaty aerobic exercise per week.  4)Reduce stress - consider counseling, meditation and relaxation to balance other aspects of your life.

## 2017-10-01 NOTE — Addendum Note (Signed)
Addended by: Sallee LangeFUNDERBURK, Aracelia Brinson A on: 10/01/2017 11:02 AM   Modules accepted: Orders

## 2017-10-01 NOTE — Progress Notes (Signed)
HPI:  Logan Sanchez is a pleasant 46 y.o. here for follow up. Chronic medical problems summarized below were reviewed for changes and stability and were updated as needed below. These issues and their treatment remain stable for the most part. She did get a few bug bites on his right forearm after being outside. These been very itchy and they did swell up. He reports they are getting somewhat better. He also woke up this morning with some left lateral hip pain. No weakness, numbness, radiation, malaise or fevers. Reports he is doing much better taking his medications every day as he has gotten a pillbox. Reports he has not been drinking on a regular basis. Reports she slipped up one or 2 times since his last visit and had some alcohol. He reports he is feeling better and is trying to stay healthier overall. Reports he will follow-up with his cardiologist seen.. Denies CP, SOB, DOE, treatment intolerance or new symptoms. Due for phq9, labs, flu shot, hiv  CAD, HTN, HLD, Obesity: -sees cardiologist - Dr. Eldridge Sanchez -statin changed last visit - appreciate recs/care -s/p DES x2 in 2017 -meds: asa, norvasc, lipitor, plavix, fish oil, imdur, losartan-hctz, metoprolol  Hx tobacco use, alcohol abuse, depression and anxiety: -quit smoking -cut back on alcohol -meds: paxil 20mg   GERD: -meds: protonix  ROS: See pertinent positives and negatives per HPI.  Past Medical History:  Diagnosis Date  . Alcohol abuse   . Angina pectoris (HCC) 12/09/2015  . Brain injury [facility])    report on disability for this, sees Dr. Arvilla Sanchez  . Coronary artery disease    a. cath 12/11/15 99% stenosed mild LAD s/p overlapping drug-eluting stents; mid Cx 80% stenosed s/p Synergy drug-eluting stent; pro RCA 70% - medical managment for now, could consider cath  . Depression   . Elevated LFTs   . Erectile dysfunction   . Hyperlipemia   . Hypertension   . Mood disorder (HCC)    depression and GAD  . Obesity   .  Rheumatoid arthritis involving right hip [facility])     Past Surgical History:  Procedure Laterality Date  . CARDIAC CATHETERIZATION N/A 12/11/2015   Procedure: Left Heart Cath and Coronary Angiography;  Surgeon: Logan Crafts, MD;  Location: [facility] INVASIVE CV LAB;  Service: Cardiovascular;  Laterality: N/A;  . CARDIAC CATHETERIZATION N/A 12/11/2015   Procedure: Coronary Stent Intervention;  Surgeon: Logan Crafts, MD;  Location: [facility] INVASIVE CV LAB;  Service: Cardiovascular;  Laterality: N/A;  . CORONARY ANGIOPLASTY    . FOREARM FRACTURE SURGERY Right ~ 1998   "I've got a metal rod in it"  . FRACTURE SURGERY      Family History  Problem Relation Age of Onset  . Hypertension Mother   . Hypertension Father   . Heart attack Maternal Grandmother   . Hypertension Maternal Grandmother   . Stroke Maternal Grandmother   . Heart attack Maternal Grandfather   . Hypertension Maternal Grandfather   . Hypertension Paternal Grandmother   . Hypertension Paternal Grandfather     Social History   Social History  . Marital status: Single    Spouse name: N/A  . Number of children: N/A  . Years of education: N/A   Social History Main Topics  . Smoking status: Former Smoker    Packs/day: 1.50    Years: 25.00    Types: Cigarettes    Quit date: 09/30/2014  . Smokeless tobacco: Never Used  . Alcohol use 45.6 oz/week  23 Cans of beer, 53 Shots of liquor per week     Comment: 12/11/2015 "I drink at least 1 40oz beer/day plus a pint of liquor 5 times/week"  . Drug use: No  . Sexual activity: Not Currently   Other Topics Concern  . None   Social History Narrative   Work or School: on disability for brain injury      Home Situation:       Spiritual Beliefs:      Lifestyle: no regular exercise, poor diet           Current Outpatient Prescriptions:  .  amLODipine (NORVASC) 10 MG tablet, Take 1 tablet (10 mg total) by mouth daily., Disp: 90 tablet, Rfl: 3 .  aspirin EC 81 MG  tablet, Take 1 tablet (81 mg total) by mouth daily., Disp: 90 tablet, Rfl: 3 .  atorvastatin (LIPITOR) 20 MG tablet, Take 1 tablet (20 mg total) by mouth daily., Disp: 90 tablet, Rfl: 0 .  clopidogrel (PLAVIX) 75 MG tablet, TAKE 1 TABLET (75 MG TOTAL) BY MOUTH DAILY WITH BREAKFAST., Disp: 30 tablet, Rfl: 8 .  fish oil-omega-3 fatty acids 1000 MG capsule, Take 1 g by mouth daily., Disp: , Rfl:  .  isosorbide mononitrate (IMDUR) 30 MG 24 hr tablet, TAKE 1 TABLET (30 MG TOTAL) BY MOUTH DAILY., Disp: 30 tablet, Rfl: 8 .  losartan-hydrochlorothiazide (HYZAAR) 100-25 MG tablet, TAKE 1 TABLET BY MOUTH DAILY., Disp: 90 tablet, Rfl: 1 .  metoprolol tartrate (LOPRESSOR) 25 MG tablet, Take 25 mg by mouth daily., Disp: , Rfl:  .  Misc Natural Products (GINSENG COMPLEX PO), Take 1 tablet by mouth daily., Disp: , Rfl:  .  nitroGLYCERIN (NITROSTAT) 0.4 MG SL tablet, Place 1 tablet (0.4 mg total) under the tongue every 5 (five) minutes as needed for chest pain., Disp: 25 tablet, Rfl: 3 .  pantoprazole (PROTONIX) 40 MG tablet, TAKE 1 TABLET (40 MG TOTAL) BY MOUTH DAILY., Disp: 30 tablet, Rfl: 6 .  PARoxetine (PAXIL) 20 MG tablet, Take 1 tablet (20 mg total) by mouth daily., Disp: 90 tablet, Rfl: 1  EXAM:  Vitals:   10/01/17 0957 10/01/17 1021  BP: 128/88 138/86  Pulse: 90   Temp: 98.3 F (36.8 C)     Body mass index is 42.79 kg/m.  GENERAL: vitals reviewed and listed above, alert, oriented, appears well hydrated and in no acute distress  HEENT: atraumatic, conjunttiva clear, no obvious abnormalities on inspection of external nose and ears  NECK: no obvious masses on inspection  LUNGS: clear to auscultation bilaterally, no wheezes, rales or rhonchi, good air movement  CV: HRRR, no peripheral edema  MS: moves all extremities without noticeable abnormality, he has some tenderness to palpation in the IT band at the lower portion of the greater trochanteric bursitis on the left, no weakness, numbness,  swelling or other findings of concern  SKIN: 4  erythematous papules with surrounding edema on the form, overlying excoriations   PSYCH: pleasant and cooperative, no obvious depression or anxiety  ASSESSMENT AND PLAN:  Discussed the following assessment and plan:  Morbid obesity (HCC) -Lifestyle recommendations, congratulated on changes, see handout for details, follow-up in 3-4 months  Recurrent major depressive disorder, in full remission (HCC) -He reports his mood is good,phq9, continue current treatment  Mixed hyperlipidemia -Labs, lifestyle recommendations, continue medication management per his cardiologist  Coronary artery disease involving native coronary artery of native heart with angina pectoris (HCC) -Followed by his cardiologist  Essential hypertension -  Plan: Basic metabolic panel, CBC -Better on recheck, he reports he'll follow-up on this with his cardiologist at his next appointment  Former smoker -So glad he is quit  Hyperglycemia - Plan: Hemoglobin A1c  Itchy skin -Likely insect bites, topical hydrocortisone for itch and prevention discussed -Return precautions  Lateral pain of left hip -Likely IT band / greater trochanteric bursa related  -Trial of home exercises  -Follow up if persists or worsens over the next few weeks   -advised labs today, flu shot -see Phq9 for depression monitoring per institution metrics -lifestyle recs - advised healthy low sugar diet and regular exercise -Patient advised to return or notify a doctor immediately if symptoms worsen or persist or new concerns arise.  Patient Instructions  BEFORE YOU LEAVE: -phq9 -labs Ronnald Collum, add cholesterol panel IF he is fasting) -flu shot -greater trochanteric bursitis exercises -follow up: 3-4 months  Topical hydrocortisone cream for the itchy spots. Bug repellent outside. Check home if persistent new bites once colder weather or not outside.  Follow up with your  cardiologist.  Avoid alcohol.  Take all medications daily.  We have ordered labs or studies at this visit. It can take up to 1-2 weeks for results and processing. IF results require follow up or explanation, we will call you with instructions. Clinically stable results will be released to your [facility]. If you have not heard from [place] or cannot find your results in [facility] in 2 weeks please contact our office at [phone].  If you are not yet signed up for [facility], please consider signing up.   We recommend the following healthy lifestyle for LIFE: 1) Small portions.   Tip: eat off of a salad plate instead of a dinner plate.  Tip: It is ok to feel hungry after a meal - that likely means you ate an appropriate portion.  Tip: if you need more or a snack choose fruits, veggies and/or a handful of nuts or seeds.  2) Eat a healthy clean diet.   TRY TO EAT: -at least 5-7 servings of low sugar vegetables per day (not corn, potatoes or bananas.) -berries are the best choice if you wish to eat fruit.   -lean meets (fish, chicken or [place] breasts) -vegan proteins for some meals - beans or tofu, whole grains, nuts and seeds -Replace bad fats with good fats - good fats include: fish, nuts and seeds, canola oil, olive oil -small amounts of low fat or non fat dairy -small amounts of100 % whole grains - check the lables  AVOID: -SUGAR, sweets, anything with added sugar, corn syrup or sweeteners -if you must have a sweetener, small amounts of stevia may be best -sweetened beverages -simple starches (rice, bread, potatoes, pasta, chips, etc - small amounts of 100% whole grains are ok) -red meat, pork, butter -fried foods, fast food, processed food, excessive dairy, eggs and coconut.  3)Get at least 150 minutes of sweaty aerobic exercise per week.  4)Reduce stress - consider counseling, meditation and relaxation to balance other aspects of your life.         Kriste Basque R., DO

## 2017-10-04 MED ORDER — ATORVASTATIN CALCIUM 40 MG PO TABS: 40.0000 mg | ORAL_TABLET | Freq: Every day | ORAL | 0 refills | Status: DC

## 2017-10-04 NOTE — Addendum Note (Signed)
Addended by: Johnella MoloneyFUNDERBURK, Marquiz Sotelo A on: 10/04/2017 03:19 PM   Modules accepted: Orders

## 2017-10-07 ENCOUNTER — Ambulatory Visit (INDEPENDENT_AMBULATORY_CARE_PROVIDER_SITE_OTHER): Payer: Medicare HMO | Admitting: Interventional Cardiology

## 2017-10-07 ENCOUNTER — Encounter: Payer: Self-pay | Admitting: Interventional Cardiology

## 2017-10-07 VITALS — BP 130/90 | HR 121 | Ht 68.0 in | Wt 290.0 lb

## 2017-10-07 DIAGNOSIS — I25119 Atherosclerotic heart disease of native coronary artery with unspecified angina pectoris: Secondary | ICD-10-CM

## 2017-10-07 DIAGNOSIS — I10 Essential (primary) hypertension: Secondary | ICD-10-CM

## 2017-10-07 DIAGNOSIS — E782 Mixed hyperlipidemia: Secondary | ICD-10-CM

## 2017-10-07 MED ORDER — METOPROLOL TARTRATE 50 MG PO TABS: 50.0000 mg | ORAL_TABLET | Freq: Two times a day (BID) | ORAL | 3 refills | Status: DC

## 2017-10-07 MED ORDER — ATORVASTATIN CALCIUM 40 MG PO TABS: 40.0000 mg | ORAL_TABLET | Freq: Every day | ORAL | 3 refills | Status: DC

## 2017-10-07 NOTE — Patient Instructions (Signed)
Medication Instructions:  Your physician has recommended you make the following change in your medication:   INCREASE: Metoprolol tartrate to 50 mg twice a day  INCREASE: atorvastatin to 40 mg daily  Labwork: Your physician recommends that you return for lab work in: 3 months for FASTING LIPIDS and LFTs   Testing/Procedures: None ordered  Follow-Up: Your physician wants you to follow-up in: 6 months with Dr. Eldridge DaceVaranasi. You will receive a reminder letter in the mail two months in advance. If you don't receive a letter, please call our office to schedule the follow-up appointment.   Any Other Special Instructions Will Be Listed Below (If Applicable).     If you need a refill on your cardiac medications before your next appointment, please call your pharmacy.

## 2017-10-07 NOTE — Progress Notes (Signed)
Cardiology Office Note   Date:  10/07/2017   ID:  Logan Sanchez, DOB March 15, 1971, MRN [id number]  PCP:  Terressa Koyanagi, DO    No chief complaint on file. CAD   Wt Readings from Last 3 Encounters:  10/07/17 290 lb (131.5 kg)  10/01/17 281 lb 6.4 oz (127.6 kg)  06/15/17 282 lb 11.2 oz (128.2 kg)       History of Present Illness: Logan Sanchez is a 46 y.o. male  Who has CAD.  He had a cath in Jan 2017.  Cath showed 99% stenosed mild LAD s/p overlapping drug-eluting stents; mid Cx 80% stenosed s/p Synergy drug-eluting stent. DAPT for at least year but probably indefinite clopidogrel given diffuse coronary disease. 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 No angina so he has been medically managed.  Alcohol use has been an issue in the past.  He is drinking less.  He has decreased  fried foods as well. He is walking more, trying to lose weight.  He has lost 10 lbs in 2018.  He had gained the weight back.  On disability from a prior head injury.  Denies : Chest pain. Dizziness. Leg edema. Nitroglycerin use. Orthopnea. Palpitations. Paroxysmal nocturnal dyspnea. Shortness of breath. Syncope.     Past Medical History:  Diagnosis Date  . Alcohol abuse   . Angina pectoris (HCC) 12/09/2015  . Brain injury [facility])    report on disability for this, sees Dr. Arvilla Market  . Coronary artery disease    a. cath 12/11/15 99% stenosed mild LAD s/p overlapping drug-eluting stents; mid Cx 80% stenosed s/p Synergy drug-eluting stent; pro RCA 70% - medical managment for now, could consider cath  . Depression   . Elevated LFTs   . Erectile dysfunction   . Hyperlipemia   . Hypertension   . Mood disorder (HCC)    depression and GAD  . Obesity   . Rheumatoid arthritis involving right hip [facility])     Past Surgical History:  Procedure Laterality Date  . CORONARY ANGIOPLASTY    . FOREARM  FRACTURE SURGERY Right ~ 1998   "I've got a metal rod in it"  . FRACTURE SURGERY       Current Outpatient Medications  Medication Sig Dispense Refill  . amLODipine (NORVASC) 10 MG tablet Take 1 tablet (10 mg total) by mouth daily. 90 tablet 3  . aspirin EC 81 MG tablet Take 1 tablet (81 mg total) by mouth daily. 90 tablet 3  . atorvastatin (LIPITOR) 20 MG tablet Take 1 tablet (20 mg total) by mouth daily. 90 tablet 0  . atorvastatin (LIPITOR) 40 MG tablet Take 1 tablet (40 mg total) daily by mouth. 90 tablet 0  . clopidogrel (PLAVIX) 75 MG tablet TAKE 1 TABLET (75 MG TOTAL) BY MOUTH DAILY WITH BREAKFAST. 30 tablet 8  . fish oil-omega-3 fatty acids 1000 MG capsule Take 1 g by mouth daily.    . isosorbide mononitrate (IMDUR) 30 MG 24 hr tablet TAKE 1 TABLET (30 MG TOTAL) BY MOUTH DAILY. 30 tablet 8  . losartan-hydrochlorothiazide (HYZAAR) 100-25 MG tablet TAKE 1 TABLET BY MOUTH DAILY. 90 tablet 1  . metoprolol tartrate (LOPRESSOR) 25 MG tablet Take 25 mg by mouth daily.    . Misc Natural Products (GINSENG COMPLEX PO) Take 1 tablet by mouth daily.    . nitroGLYCERIN (NITROSTAT) 0.4 MG SL tablet Place 1 tablet (  0.4 mg total) under the tongue every 5 (five) minutes as needed for chest pain. 25 tablet 3  . pantoprazole (PROTONIX) 40 MG tablet TAKE 1 TABLET (40 MG TOTAL) BY MOUTH DAILY. 30 tablet 6  . PARoxetine (PAXIL) 20 MG tablet Take 1 tablet (20 mg total) by mouth daily. 90 tablet 1   No current facility-administered medications for this visit.     Allergies:   Patient has no known allergies.    Social History:  The patient  reports that he quit smoking about 3 years ago. His smoking use included cigarettes. He has a 37.50 pack-year smoking history. he has never used smokeless tobacco. He reports that he drinks about 45.6 oz of alcohol per week. He reports that he does not use drugs.   Family History:  The patient's family history includes Heart attack in his maternal grandfather and  maternal grandmother; Hypertension in his father, maternal grandfather, maternal grandmother, mother, paternal grandfather, and paternal grandmother; Stroke in his maternal grandmother.    ROS:  Please see the history of present illness.   Otherwise, review of systems are positive for dietary indiscretion.   All other systems are reviewed and negative.    PHYSICAL EXAM: VS:  BP 130/90   Pulse (!) 121   Ht 5\' 8"  (1.727 m)   Wt 290 lb (131.5 kg)   SpO2 97%   BMI 44.09 kg/m  , BMI Body mass index is 44.09 kg/m. GEN: Well nourished, well developed, in no acute distress  HEENT: normal  Neck: no JVD, carotid bruits, or masses Cardiac: tachycardic; no murmurs, rubs, or gallops,no edema  Respiratory:  clear to auscultation bilaterally, normal work of breathing GI: soft, nontender, nondistended, + BS MS: no deformity or atrophy  Skin: warm and dry, no rash Neuro:  Strength and sensation are intact Psych: euthymic mood, full affect   EKG:   The ekg ordered today demonstrates sinus tachycardia, no ST changes   Recent Labs: 10/01/2017: BUN 12; Creatinine, Ser 0.90; Hemoglobin 16.0; Platelets 331.0; Potassium 3.3; Sodium 139   Lipid Panel    Component Value Date/Time   CHOL 252 (H) 10/01/2017 1055   TRIG 243.0 (H) 10/01/2017 1055   HDL 41.70 10/01/2017 1055   CHOLHDL 6 10/01/2017 1055   VLDL 48.6 (H) 10/01/2017 1055   LDLCALC 112 (H) 02/19/2014 1542   LDLDIRECT 177.0 10/01/2017 1055     Other studies Reviewed: Additional studies/ records that were reviewed today with results demonstrating: .   ASSESSMENT AND PLAN:  1. CAD:  No angina on medical therapy.  Continue aggressive secondary prevention including better diet.  THis will help with obesity.  Decrease fried food intake.   2. Hyperlipidemia: LDL has increased.  TG have decreased.  Increase atorvastatin to 40 mg daily.  COuld increase fish oil to 4 grams daily in the future if TG increase.  Check lipids and liver in 3  months. 3. Tachycardia: Increase metoprolol to 50 mg BID.   4. HTN: Fairly well controlled. May improve with additional metoprolol.     Current medicines are reviewed at length with the patient today.  The patient concerns regarding his medicines were addressed.  The following changes have been made:  Increase atorvastatin and metoprolol  Labs/ tests ordered today include:  No orders of the defined types were placed in this encounter.   Recommend 150 minutes/week of aerobic exercise Low fat, low carb, high fiber diet recommended  Disposition:   FU in 6 months  Signed, Lance MussJayadeep Harout Scheurich, MD  10/07/2017 12:04 PM    [facility] [address], [place], [place]  [address] Phone: [phone]; Fax: [phone]

## 2017-12-09 ENCOUNTER — Encounter: Payer: Self-pay | Admitting: Family Medicine

## 2018-01-07 ENCOUNTER — Other Ambulatory Visit: Payer: Medicare HMO

## 2018-01-10 ENCOUNTER — Other Ambulatory Visit: Payer: Medicare HMO | Admitting: *Deleted

## 2018-01-10 DIAGNOSIS — E782 Mixed hyperlipidemia: Secondary | ICD-10-CM

## 2018-01-11 LAB — LIPID PANEL
CHOLESTEROL TOTAL: 187 mg/dL (ref 100–199)
Chol/HDL Ratio: 5.7 ratio — ABNORMAL HIGH (ref 0.0–5.0)
HDL: 33 mg/dL — AB (ref >39)
LDL CALC: 79 mg/dL (ref 0–99)
TRIGLYCERIDES: 375 mg/dL — AB (ref 0–149)
VLDL CHOLESTEROL CAL: 75 mg/dL — AB (ref 5–40)

## 2018-01-11 LAB — HEPATIC FUNCTION PANEL
ALT: 38 IU/L (ref 0–44)
AST: 48 IU/L — AB (ref 0–40)
Albumin: 3.9 g/dL (ref 3.5–5.5)
Alkaline Phosphatase: 111 IU/L (ref 39–117)
Bilirubin Total: 0.5 mg/dL (ref 0.0–1.2)
Bilirubin, Direct: 0.19 mg/dL (ref 0.00–0.40)
Total Protein: 6.8 g/dL (ref 6.0–8.5)

## 2018-02-02 ENCOUNTER — Ambulatory Visit: Payer: Self-pay

## 2018-02-02 NOTE — Progress Notes (Signed)
HPI:  Using dictation device. Unfortunately this device frequently misinterprets words/phrases.  Logan Sanchez is a pleasant 46 y.o. here for follow up. Chronic medical problems summarized below were reviewed for changes. Lipids done with cardiology 12/2017 --> seeing lipid clinic.  Reports he is doing well now, however he was not feeling good about a month ago.  He reports he was not drinking and had quit smoking.  However he went to a family reunion and some of his family talked him into having some alcohol.  He ended up with gout that lasted for a few weeks.  Reports this felt terrible.  Now resolved. Denies CP, SOB, DOE, treatment intolerance or new symptoms. Due for AWV Due for bmp, cbc, hiv?  AWV 12/11/16 CAD, HTN, HLD: -sees cardiologist - Dr. Eldridge Dace - appreciate recs/care -s/p DES x2 in 2017 -meds: asa, norvasc, lipitor, plavix, fish oil, imdur, losartan-hctz, metoprolol  Obesity/Hyperglycemia: -have provided him with extensive lifestyle recommendations and other options for wt reduction -he was able to loose some weight in 2018  Hx tobacco use, alcohol abuse, depression and anxiety: -quit smoking -cut back on alcohol -meds: paxil 20mg   GERD: -meds: protonix  ROS: See pertinent positives and negatives per HPI.  Past Medical History:  Diagnosis Date  . Alcohol abuse   . Angina pectoris (HCC) 12/09/2015  . Brain injury [facility])    report on disability for this, sees Dr. Arvilla Market  . Coronary artery disease    a. cath 12/11/15 99% stenosed mild LAD s/p overlapping drug-eluting stents; mid Cx 80% stenosed s/p Synergy drug-eluting stent; pro RCA 70% - medical managment for now, could consider cath  . Depression   . Elevated LFTs   . Erectile dysfunction   . Hyperlipemia   . Hypertension   . Mood disorder (HCC)    depression and GAD  . Obesity   . Rheumatoid arthritis involving right hip [facility])     Past Surgical History:  Procedure Laterality Date  . CARDIAC  CATHETERIZATION N/A 12/11/2015   Procedure: Left Heart Cath and Coronary Angiography;  Surgeon: Corky Crafts, MD;  Location: [facility] INVASIVE CV LAB;  Service: Cardiovascular;  Laterality: N/A;  . CARDIAC CATHETERIZATION N/A 12/11/2015   Procedure: Coronary Stent Intervention;  Surgeon: Corky Crafts, MD;  Location: [facility] INVASIVE CV LAB;  Service: Cardiovascular;  Laterality: N/A;  . CORONARY ANGIOPLASTY    . FOREARM FRACTURE SURGERY Right ~ 1998   "I've got a metal rod in it"  . FRACTURE SURGERY      Family History  Problem Relation Age of Onset  . Hypertension Mother   . Hypertension Father   . Heart attack Maternal Grandmother   . Hypertension Maternal Grandmother   . Stroke Maternal Grandmother   . Heart attack Maternal Grandfather   . Hypertension Maternal Grandfather   . Hypertension Paternal Grandmother   . Hypertension Paternal Grandfather     Social History   Socioeconomic History  . Marital status: Single    Spouse name: None  . Number of children: None  . Years of education: None  . Highest education level: None  Social Needs  . Financial resource strain: None  . Food insecurity - worry: None  . Food insecurity - inability: None  . Transportation needs - medical: None  . Transportation needs - non-medical: None  Occupational History  . None  Tobacco Use  . Smoking status: Former Smoker    Packs/day: 1.50    Years: 25.00    Pack  years: 37.50    Types: Cigarettes    Last attempt to quit: 09/30/2014    Years since quitting: 3.3  . Smokeless tobacco: Never Used  Substance and Sexual Activity  . Alcohol use: Yes    Alcohol/week: 45.6 oz    Types: 23 Cans of beer, 53 Shots of liquor per week    Comment: 12/11/2015 "I drink at least 1 40oz beer/day plus a pint of liquor 5 times/week"  . Drug use: No  . Sexual activity: Not Currently  Other Topics Concern  . None  Social History Narrative   Work or School: on disability for brain injury      Home  Situation:       Spiritual Beliefs:      Lifestyle: no regular exercise, poor diet        Current Outpatient Medications:  .  amLODipine (NORVASC) 10 MG tablet, Take 1 tablet (10 mg total) by mouth daily., Disp: 90 tablet, Rfl: 3 .  aspirin EC 81 MG tablet, Take 1 tablet (81 mg total) by mouth daily., Disp: 90 tablet, Rfl: 3 .  clopidogrel (PLAVIX) 75 MG tablet, TAKE 1 TABLET (75 MG TOTAL) BY MOUTH DAILY WITH BREAKFAST., Disp: 30 tablet, Rfl: 8 .  fish oil-omega-3 fatty acids 1000 MG capsule, Take 1 g by mouth daily., Disp: , Rfl:  .  isosorbide mononitrate (IMDUR) 30 MG 24 hr tablet, TAKE 1 TABLET (30 MG TOTAL) BY MOUTH DAILY., Disp: 30 tablet, Rfl: 8 .  losartan-hydrochlorothiazide (HYZAAR) 100-25 MG tablet, TAKE 1 TABLET BY MOUTH DAILY., Disp: 90 tablet, Rfl: 1 .  Misc Natural Products (GINSENG COMPLEX PO), Take 1 tablet by mouth daily., Disp: , Rfl:  .  nitroGLYCERIN (NITROSTAT) 0.4 MG SL tablet, Place 1 tablet (0.4 mg total) under the tongue every 5 (five) minutes as needed for chest pain., Disp: 25 tablet, Rfl: 3 .  pantoprazole (PROTONIX) 40 MG tablet, TAKE 1 TABLET (40 MG TOTAL) BY MOUTH DAILY., Disp: 30 tablet, Rfl: 6 .  PARoxetine (PAXIL) 20 MG tablet, Take 1 tablet (20 mg total) by mouth daily., Disp: 90 tablet, Rfl: 1 .  atorvastatin (LIPITOR) 40 MG tablet, Take 1 tablet (40 mg total) daily by mouth., Disp: 90 tablet, Rfl: 3 .  metoprolol tartrate (LOPRESSOR) 50 MG tablet, Take 1 tablet (50 mg total) 2 (two) times daily by mouth., Disp: 180 tablet, Rfl: 3  EXAM:  Vitals:   02/03/18 1104  BP: 120/74  Pulse: 90  Temp: 99.2 F (37.3 C)    Body mass index is 44.98 kg/m.  GENERAL: vitals reviewed and listed above, alert, oriented, appears well hydrated and in no acute distress  HEENT: atraumatic, subcunj hemorrage L, no obvious abnormalities on inspection of external nose and ears  NECK: no obvious masses on inspection  LUNGS: clear to auscultation bilaterally, no  wheezes, rales or rhonchi, good air movement  CV: HRRR, no peripheral edema  MS: moves all extremities without noticeable abnormality  PSYCH: pleasant and cooperative, no obvious depression or anxiety  ASSESSMENT AND PLAN:  Discussed the following assessment and plan:  Morbid obesity (HCC) -Lifestyle recommendations, advised a healthy diet and regular exercise  Alcohol use -Congratulated him on quitting again, I am sorry that he had to go through the consequences of his last drinking event  Essential hypertension -Blood pressure little high on arrival, better on recheck, continue current medications, check labs today  Hyperglycemia -Checking blood glucose on labs, recommended a healthy low sugar diet  Coronary artery  disease involving native coronary artery of native heart with angina pectoris (HCC) Mixed hyperlipidemia -Sees cardiology for management -reports he will be seeing the lipid clinic about his cholesterol  Recurrent major depressive disorder, in full remission (HCC) -See PHQ 9, fairly well controlled  Discussed the subconjunctival hemorrhage, return precautions.   Patient Instructions  BEFORE YOU LEAVE: -labs (add HIV if he wishes) -follow up: AWV with Darl PikesSusan and follow up with Dr. Selena BattenKim 3 months  We have ordered labs or studies at this visit. It can take up to 1-2 weeks for results and processing. IF results require follow up or explanation, we will call you with instructions. Clinically stable results will be released to your [facility]. If you have not heard from us or cannot find your results in [facility] in 2 weeks please contact our office at [phone].  If you are not yet signed up for [facility], please consider signing up.   We recommend the following healthy lifestyle for LIFE: 1) Small portions. But, make sure to get regular (at least 3 per day), healthy meals and small healthy snacks if needed.  2) Eat a healthy clean diet.   TRY TO EAT: -at least 5-7  servings of low sugar, colorful, and nutrient rich vegetables per day (not corn, potatoes or bananas.) -berries are the best choice if you wish to eat fruit (only eat small amounts if trying to reduce weight)  -lean meets (fish, white meat of chicken or [place]key) -vegan proteins for some meals - beans or tofu, whole grains, nuts and seeds -Replace bad fats with good fats - good fats include: fish, nuts and seeds, canola oil, olive oil -small amounts of low fat or non fat dairy -small amounts of100 % whole grains - check the lables -drink plenty of water  AVOID: -SUGAR, sweets, anything with added sugar, corn syrup or sweeteners - must read labels as even foods advertised as "healthy" often are loaded with sugar -if you must have a sweetener, small amounts of stevia may be best -sweetened beverages and artificially sweetened beverages -simple starches (rice, bread, potatoes, pasta, chips, etc - small amounts of 100% whole grains are ok) -red meat, pork, butter -fried foods, fast food, processed food, excessive dairy, eggs and coconut.  3)Get at least 150 minutes of sweaty aerobic exercise per week.  4)Reduce stress - consider counseling, meditation and relaxation to balance other aspects of your life.         Terressa KoyanagiHannah R Kim, DO

## 2018-02-03 ENCOUNTER — Ambulatory Visit (INDEPENDENT_AMBULATORY_CARE_PROVIDER_SITE_OTHER): Payer: Medicare HMO | Admitting: Family Medicine

## 2018-02-03 ENCOUNTER — Other Ambulatory Visit: Payer: Self-pay | Admitting: Interventional Cardiology

## 2018-02-03 ENCOUNTER — Encounter: Payer: Self-pay | Admitting: Family Medicine

## 2018-02-03 DIAGNOSIS — I10 Essential (primary) hypertension: Secondary | ICD-10-CM

## 2018-02-03 DIAGNOSIS — I25119 Atherosclerotic heart disease of native coronary artery with unspecified angina pectoris: Secondary | ICD-10-CM

## 2018-02-03 DIAGNOSIS — E782 Mixed hyperlipidemia: Secondary | ICD-10-CM | POA: Diagnosis not present

## 2018-02-03 DIAGNOSIS — R739 Hyperglycemia, unspecified: Secondary | ICD-10-CM | POA: Diagnosis not present

## 2018-02-03 DIAGNOSIS — Z6841 Body Mass Index (BMI) 40.0 and over, adult: Secondary | ICD-10-CM | POA: Diagnosis not present

## 2018-02-03 DIAGNOSIS — S069X9S Unspecified intracranial injury with loss of consciousness of unspecified duration, sequela: Secondary | ICD-10-CM

## 2018-02-03 DIAGNOSIS — Z789 Other specified health status: Secondary | ICD-10-CM | POA: Diagnosis not present

## 2018-02-03 DIAGNOSIS — I209 Angina pectoris, unspecified: Secondary | ICD-10-CM

## 2018-02-03 DIAGNOSIS — F3342 Major depressive disorder, recurrent, in full remission: Secondary | ICD-10-CM

## 2018-02-03 DIAGNOSIS — F10929 Alcohol use, unspecified with intoxication, unspecified: Secondary | ICD-10-CM | POA: Diagnosis not present

## 2018-02-03 DIAGNOSIS — Z7289 Other problems related to lifestyle: Secondary | ICD-10-CM

## 2018-02-03 LAB — CBC
HCT: 38 % — ABNORMAL LOW (ref 39.0–52.0)
Hemoglobin: 13.4 g/dL (ref 13.0–17.0)
MCHC: 35.1 g/dL (ref 30.0–36.0)
MCV: 95.2 fl (ref 78.0–100.0)
Platelets: 520 10*3/uL — ABNORMAL HIGH (ref 150.0–400.0)
RBC: 3.99 Mil/uL — AB (ref 4.22–5.81)
RDW: 14.5 % (ref 11.5–15.5)
WBC: 8.2 10*3/uL (ref 4.0–10.5)

## 2018-02-03 LAB — BASIC METABOLIC PANEL
BUN: 10 mg/dL (ref 6–23)
CALCIUM: 9.7 mg/dL (ref 8.4–10.5)
CO2: 30 meq/L (ref 19–32)
CREATININE: 0.77 mg/dL (ref 0.40–1.50)
Chloride: 99 mEq/L (ref 96–112)
GFR: 140.05 mL/min (ref >60.00)
GLUCOSE: 110 mg/dL — AB (ref 70–99)
Potassium: 3.6 mEq/L (ref 3.5–5.1)
Sodium: 139 mEq/L (ref 135–145)

## 2018-02-03 MED ORDER — ISOSORBIDE MONONITRATE ER 30 MG PO TB24: 30.0000 mg | ORAL_TABLET | Freq: Every day | ORAL | 2 refills | Status: DC

## 2018-02-03 NOTE — Patient Instructions (Addendum)
BEFORE YOU LEAVE: -labs (add HIV if he wishes) -follow up: AWV with Darl PikesSusan and follow up with Dr. Selena BattenKim 3 months  We have ordered labs or studies at this visit. It can take up to 1-2 weeks for results and processing. IF results require follow up or explanation, we will call you with instructions. Clinically stable results will be released to your [facility]. If you have not heard from us or cannot find your results in [facility] in 2 weeks please contact our office at [phone].  If you are not yet signed up for [facility], please consider signing up.   We recommend the following healthy lifestyle for LIFE: 1) Small portions. But, make sure to get regular (at least 3 per day), healthy meals and small healthy snacks if needed.  2) Eat a healthy clean diet.   TRY TO EAT: -at least 5-7 servings of low sugar, colorful, and nutrient rich vegetables per day (not corn, potatoes or bananas.) -berries are the best choice if you wish to eat fruit (only eat small amounts if trying to reduce weight)  -lean meets (fish, white meat of chicken or [place]) -vegan proteins for some meals - beans or tofu, whole grains, nuts and seeds -Replace bad fats with good fats - good fats include: fish, nuts and seeds, canola oil, olive oil -small amounts of low fat or non fat dairy -small amounts of100 % whole grains - check the lables -drink plenty of water  AVOID: -SUGAR, sweets, anything with added sugar, corn syrup or sweeteners - must read labels as even foods advertised as "healthy" often are loaded with sugar -if you must have a sweetener, small amounts of stevia may be best -sweetened beverages and artificially sweetened beverages -simple starches (rice, bread, potatoes, pasta, chips, etc - small amounts of 100% whole grains are ok) -red meat, pork, butter -fried foods, fast food, processed food, excessive dairy, eggs and coconut.  3)Get at least 150 minutes of sweaty aerobic exercise per week.  4)Reduce stress -  consider counseling, meditation and relaxation to balance other aspects of your life.

## 2018-02-14 ENCOUNTER — Other Ambulatory Visit: Payer: Self-pay | Admitting: Physician Assistant

## 2018-02-17 ENCOUNTER — Ambulatory Visit (INDEPENDENT_AMBULATORY_CARE_PROVIDER_SITE_OTHER): Payer: Medicare HMO | Admitting: Pharmacist

## 2018-02-17 DIAGNOSIS — E782 Mixed hyperlipidemia: Secondary | ICD-10-CM | POA: Diagnosis not present

## 2018-02-17 DIAGNOSIS — R739 Hyperglycemia, unspecified: Secondary | ICD-10-CM | POA: Diagnosis not present

## 2018-02-17 MED ORDER — ATORVASTATIN CALCIUM 80 MG PO TABS: 80.0000 mg | ORAL_TABLET | Freq: Every day | ORAL | 3 refills | Status: DC

## 2018-02-17 MED ORDER — ICOSAPENT ETHYL 1 G PO CAPS: 2.0000 g | ORAL_CAPSULE | Freq: Two times a day (BID) | ORAL | 11 refills | Status: DC

## 2018-02-17 NOTE — Patient Instructions (Signed)
Increase your atorvastatin (Lipitor) to 80mg  once a day  Stop taking over the counter fish oil and start taking Vascepa - prescription fish oil 4 grams daily  Try to start going to the gym at least once a week and start walking regularly  Try to look for healthy fats like avocado and nuts  Try to limit your fast food and fried food  Try to cut back on sweets. Switch over to unsweetened tea and watch your portion sizes for desserts  Recheck cholesterol in 3 months

## 2018-02-17 NOTE — Progress Notes (Signed)
Patient ID: Logan Sanchez                 DOB: 03-28-1971                    MRN: [id number]     HPI: Logan Sanchez is a 47 y.o. male patient referred to lipid clinic by Dr Eldridge Dace. PMH is significant for CAD s/p DES to mid LAD and mid Cx in January 2017, angina, HTN, HLD, morbid obesity, depression, alcohol abuse, and prior tobacco abuse.  Patient presents today in good spirits. He reports compliance with his atorvastatin 40mg  and fish oil each day. He increased his fish oil from 1g to 2g after his cholesterol was checked last month. Activity has been limited due to frequent gout flares. Diet is very poor - pt indulges in fried food, fast food, and many sweets each day. He drinks 1/2 gallon of sweet tea each day.  Current Medications: atorvastatin 40mg  daily, fish oil 2g daily Intolerances: none Risk Factors: CAD s/p PCI, angina, HTN, obesity, former tobacco abuse LDL goal: 70mg /dL  Diet: Has tried to cut back on fried food and fast food - does eat fast food once a week. Likes fried chicken wings - eats these once a week. Eats cheeseburgers 1-2x per week. Has a sweet tooth and eats ice cream, candy, cheesecake and pie frequently. 1-2 alcoholic drinks per week. Drinks sweet tea every day - 1 gallon lasts 2 days.  Exercise: Limited due to gout. Joined the gym but has not been in 5 months.   Family History: The patient's family history includes Heart attack in his maternal grandfather and maternal grandmother; Hypertension in his father, maternal grandfather, maternal grandmother, mother, paternal grandfather, and paternal grandmother; Stroke in his maternal grandmother.   Social History: The patient  reports that he quit smoking about 3 years ago. His smoking use included cigarettes. He has a 37.50 pack-year smoking history. he has never used smokeless tobacco. He has cut back on alcohol intake to 1-3 mixed drinks per week. He reports that he does not use drugs.   Labs: 01/10/18: TC 187, TG  375, HDL 33, LDL 79 (atorvastatin 40mg  daily, fish oil 1g daily)  Past Medical History:  Diagnosis Date  . Alcohol abuse   . Angina pectoris (HCC) 12/09/2015  . Brain injury [facility])    report on disability for this, sees Dr. Arvilla Market  . Coronary artery disease    a. cath 12/11/15 99% stenosed mild LAD s/p overlapping drug-eluting stents; mid Cx 80% stenosed s/p Synergy drug-eluting stent; pro RCA 70% - medical managment for now, could consider cath  . Depression   . Elevated LFTs   . Erectile dysfunction   . Hyperlipemia   . Hypertension   . Mood disorder (HCC)    depression and GAD  . Obesity   . Rheumatoid arthritis involving right hip [facility])     Current Outpatient Medications on File Prior to Visit  Medication Sig Dispense Refill  . amLODipine (NORVASC) 10 MG tablet Take 1 tablet (10 mg total) by mouth daily. 90 tablet 3  . aspirin EC 81 MG tablet Take 1 tablet (81 mg total) by mouth daily. 90 tablet 3  . atorvastatin (LIPITOR) 40 MG tablet Take 1 tablet (40 mg total) daily by mouth. 90 tablet 3  . clopidogrel (PLAVIX) 75 MG tablet Take 1 tablet (75 mg total) by mouth daily with breakfast. 90 tablet 1  . fish oil-omega-3 fatty acids  1000 MG capsule Take 1 g by mouth daily.    . isosorbide mononitrate (IMDUR) 30 MG 24 hr tablet Take 1 tablet (30 mg total) by mouth daily. 90 tablet 2  . losartan-hydrochlorothiazide (HYZAAR) 100-25 MG tablet TAKE 1 TABLET BY MOUTH DAILY. 90 tablet 1  . metoprolol tartrate (LOPRESSOR) 50 MG tablet Take 1 tablet (50 mg total) 2 (two) times daily by mouth. 180 tablet 3  . Misc Natural Products (GINSENG COMPLEX PO) Take 1 tablet by mouth daily.    . nitroGLYCERIN (NITROSTAT) 0.4 MG SL tablet Place 1 tablet (0.4 mg total) under the tongue every 5 (five) minutes as needed for chest pain. 25 tablet 3  . pantoprazole (PROTONIX) 40 MG tablet TAKE 1 TABLET (40 MG TOTAL) BY MOUTH DAILY. 30 tablet 6  . PARoxetine (PAXIL) 20 MG tablet Take 1 tablet (20 mg total) by  mouth daily. 90 tablet 1   No current facility-administered medications on file prior to visit.     No Known Allergies  Assessment/Plan:  1. Hyperlipidemia - TG above goal < 150 and LDL above goal < 70 due to hx of ASCVD. Will increase atorvastatin to 80mg  daily, and switch fish oil to higher dose of Vascepa 2g BID. Discussed lifestyle extensively as poor habits are largely contributing to elevated triglycerides. Pt will work to switch to [employer] Corporationunsweet tea and limit intake of sweets, fried food, and fast food. Encouraged pt to start walking every day. Will recheck lipids in 3 months with direct LDL due to hx of elevated TG.   Megan E. Supple, PharmD, CPP, BCACP [facility] [address], [place], [place] [address] Phone: [phone]; Fax: [phone] 02/17/2018 10:12 AM

## 2018-03-03 ENCOUNTER — Ambulatory Visit: Payer: Self-pay | Admitting: Family Medicine

## 2018-03-08 ENCOUNTER — Ambulatory Visit (INDEPENDENT_AMBULATORY_CARE_PROVIDER_SITE_OTHER): Payer: Medicare HMO | Admitting: Family Medicine

## 2018-03-08 ENCOUNTER — Encounter: Payer: Self-pay | Admitting: Family Medicine

## 2018-03-08 ENCOUNTER — Other Ambulatory Visit: Payer: Self-pay | Admitting: Family Medicine

## 2018-03-08 ENCOUNTER — Ambulatory Visit (INDEPENDENT_AMBULATORY_CARE_PROVIDER_SITE_OTHER): Payer: Medicare HMO

## 2018-03-08 VITALS — BP 118/80 | HR 94 | Temp 98.8°F | Ht 68.0 in | Wt 286.2 lb

## 2018-03-08 VITALS — BP 120/80 | Ht 69.0 in | Wt 286.0 lb

## 2018-03-08 DIAGNOSIS — E782 Mixed hyperlipidemia: Secondary | ICD-10-CM

## 2018-03-08 DIAGNOSIS — R7989 Other specified abnormal findings of blood chemistry: Secondary | ICD-10-CM | POA: Diagnosis not present

## 2018-03-08 DIAGNOSIS — M2550 Pain in unspecified joint: Secondary | ICD-10-CM | POA: Diagnosis not present

## 2018-03-08 DIAGNOSIS — Z0000 Encounter for general adult medical examination without abnormal findings: Secondary | ICD-10-CM

## 2018-03-08 DIAGNOSIS — R739 Hyperglycemia, unspecified: Secondary | ICD-10-CM

## 2018-03-08 LAB — CBC WITH DIFFERENTIAL/PLATELET
BASOS PCT: 1.6 % (ref 0.0–3.0)
Basophils Absolute: 0.1 10*3/uL (ref 0.0–0.1)
EOS ABS: 0.2 10*3/uL (ref 0.0–0.7)
Eosinophils Relative: 3.1 % (ref 0.0–5.0)
HCT: 40.6 % (ref 39.0–52.0)
HEMOGLOBIN: 14.1 g/dL (ref 13.0–17.0)
LYMPHS PCT: 30.5 % (ref 12.0–46.0)
Lymphs Abs: 2.1 10*3/uL (ref 0.7–4.0)
MCHC: 34.9 g/dL (ref 30.0–36.0)
MCV: 95 fl (ref 78.0–100.0)
MONO ABS: 0.5 10*3/uL (ref 0.1–1.0)
Monocytes Relative: 7.2 % (ref 3.0–12.0)
Neutro Abs: 3.9 10*3/uL (ref 1.4–7.7)
Neutrophils Relative %: 57.6 % (ref 43.0–77.0)
PLATELETS: 285 10*3/uL (ref 150.0–400.0)
RBC: 4.27 Mil/uL (ref 4.22–5.81)
RDW: 14.6 % (ref 11.5–15.5)
WBC: 6.7 10*3/uL (ref 4.0–10.5)

## 2018-03-08 LAB — URIC ACID: URIC ACID, SERUM: 10 mg/dL — AB (ref 4.0–7.8)

## 2018-03-08 LAB — HEMOGLOBIN A1C: HEMOGLOBIN A1C: 5.8 % (ref 4.6–6.5)

## 2018-03-08 MED ORDER — ALLOPURINOL 100 MG PO TABS: 100.0000 mg | ORAL_TABLET | Freq: Every day | ORAL | 3 refills | Status: DC

## 2018-03-08 NOTE — Progress Notes (Signed)
HPI:  Using dictation device. Unfortunately this device frequently misinterprets words/phrases.  Logan Sanchez is a pleasant 47 y.o. here for follow up elevated blood sugar and platelets. Did not feel well last month after drinking alcohol and what he thinks was a gout flare.  Reports he has improved his diet dramatically and is eating more salads, less junk food and less sweets.  He continues to drink a lot of sweetened beverages however, alternating soda and sweet tea.  He reports he has not had any problems with gout and has been feeling better for the most part.  He occasionally feels a little "swimmy headed".  Denies CP, palpitations, swelling, changes in bowels, fevers, bleeding, bruising, nausea, vomiting, joint pains, SOB, DOE, treatment intolerance or new symptoms.  Denies tobacco use.  Reports his alcohol use is rare.  Not getting any regular exercise. Due for cbc, uric acid, hgba1c, ? hiv He is going to see Bartholomew BoardsSusan Hauke for his annual wellness visit today  PMH:  AWV 12/11/16 CAD, HTN, HLD: -sees cardiologist - Dr. Eldridge DaceVaranasi -Referred to lipid clinic by his cardiologist - appreciate recs/care -s/p DES x2 in 2017 -meds: asa, norvasc, lipitor, plavix, fish oil, imdur, losartan-hctz, metoprolol  Morbid Obesity/Hyperglycemia: -have provided him with extensive lifestyle recommendations and other options for wt reduction -he was able to loose some weight in 2018  Hx tobacco use, alcohol abuse, depression and anxiety: -quit smoking -cut back on alcohol -meds: paxil 20mg   GERD: -meds: protonix   ROS: See pertinent positives and negatives per HPI.  Past Medical History:  Diagnosis Date  . Alcohol abuse   . Angina pectoris (HCC) 12/09/2015  . Brain injury [facility](HCC)    report on disability for this, sees Dr. Arvilla MarketShwartz  . Coronary artery disease    a. cath 12/11/15 99% stenosed mild LAD s/p overlapping drug-eluting stents; mid Cx 80% stenosed s/p Synergy drug-eluting stent; pro RCA  70% - medical managment for now, could consider cath  . Depression   . Elevated LFTs   . Erectile dysfunction   . Hyperlipemia   . Hypertension   . Mood disorder (HCC)    depression and GAD  . Obesity   . Rheumatoid arthritis involving right hip [facility](HCC)     Past Surgical History:  Procedure Laterality Date  . CARDIAC CATHETERIZATION N/A 12/11/2015   Procedure: Left Heart Cath and Coronary Angiography;  Surgeon: Corky CraftsJayadeep S Varanasi, MD;  [facility];  Service: Cardiovascular;  Laterality: N/A;  . CARDIAC CATHETERIZATION N/A 12/11/2015   Procedure: Coronary Stent Intervention;  Surgeon: Corky CraftsJayadeep S Varanasi, MD;  [facility];  Service: Cardiovascular;  Laterality: N/A;  . CORONARY ANGIOPLASTY    . FOREARM FRACTURE SURGERY Right ~ 1998   "I've got a metal rod in it"  . FRACTURE SURGERY      Family History  Problem Relation Age of Onset  . Hypertension Mother   . Hypertension Father   . Heart attack Maternal Grandmother   . Hypertension Maternal Grandmother   . Stroke Maternal Grandmother   . Heart attack Maternal Grandfather   . Hypertension Maternal Grandfather   . Hypertension Paternal Grandmother   . Hypertension Paternal Grandfather     SOCIAL HX: see hpi   Current Outpatient Medications:  .  amLODipine (NORVASC) 10 MG tablet, Take 1 tablet (10 mg total) by mouth daily., Disp: 90 tablet, Rfl: 3 .  aspirin EC 81 MG tablet, Take 1 tablet (81 mg total) by mouth daily.,  Disp: 90 tablet, Rfl: 3 .  atorvastatin (LIPITOR) 80 MG tablet, Take 1 tablet (80 mg total) by mouth daily., Disp: 90 tablet, Rfl: 3 .  clopidogrel (PLAVIX) 75 MG tablet, Take 1 tablet (75 mg total) by mouth daily with breakfast., Disp: 90 tablet, Rfl: 1 .  Icosapent Ethyl (VASCEPA) 1 g CAPS, Take 2 capsules (2 g total) by mouth 2 (two) times daily., Disp: 120 capsule, Rfl: 11 .  isosorbide mononitrate (IMDUR) 30 MG 24 hr tablet, Take 1 tablet (30 mg total) by mouth daily., Disp:  90 tablet, Rfl: 2 .  losartan-hydrochlorothiazide (HYZAAR) 100-25 MG tablet, TAKE 1 TABLET BY MOUTH DAILY., Disp: 90 tablet, Rfl: 1 .  Misc Natural Products (GINSENG COMPLEX PO), Take 1 tablet by mouth daily., Disp: , Rfl:  .  nitroGLYCERIN (NITROSTAT) 0.4 MG SL tablet, Place 1 tablet (0.4 mg total) under the tongue every 5 (five) minutes as needed for chest pain., Disp: 25 tablet, Rfl: 3 .  pantoprazole (PROTONIX) 40 MG tablet, TAKE 1 TABLET (40 MG TOTAL) BY MOUTH DAILY., Disp: 30 tablet, Rfl: 6 .  PARoxetine (PAXIL) 20 MG tablet, Take 1 tablet (20 mg total) by mouth daily., Disp: 90 tablet, Rfl: 1 .  metoprolol tartrate (LOPRESSOR) 50 MG tablet, Take 1 tablet (50 mg total) 2 (two) times daily by mouth., Disp: 180 tablet, Rfl: 3  EXAM:  Vitals:   03/08/18 1305  BP: 118/80  Pulse: 94  Temp: 98.8 F (37.1 C)    Body mass index is 43.52 kg/m.  GENERAL: vitals reviewed and listed above, alert, oriented, appears well hydrated and in no acute distress  HEENT: atraumatic, conjunttiva clear, no obvious abnormalities on inspection of external nose and ears  NECK: no obvious masses on inspection  LUNGS: clear to auscultation bilaterally, no wheezes, rales or rhonchi, good air movement  CV: HRRR, no peripheral edema  MS: moves all extremities without noticeable abnormality  PSYCH/NEURO: pleasant and cooperative, no obvious depression or anxiety, speech and thought processing grossly intact, cranial nerves II through XII grossly intact, finger to nose normal  ASSESSMENT AND PLAN:  Discussed the following assessment and plan:  Elevated platelet count - Plan: CBC with Differential/Platelet  Arthralgia, unspecified joint - Plan: Uric Acid  Morbid obesity (HCC)  Elevated blood sugar - Plan: Hemoglobin A1c  -Labs per orders -if persistent platelet issue, will have him see the hematologist  -Counseled extensively on dietary recommendations for morbid obesity and elevated blood sugar,  recommended that he avoid sweets and sweetened beverages and eat a healthy diet -Encouraged that he gradually try to increase his activity level to 150 minutes/week-of aerobic exercise -Seeing Darl Pikes today for annual wellness visit- -Patient advised to return or notify a doctor immediately if symptoms worsen or persist or new concerns arise.  Patient Instructions  BEFORE YOU LEAVE: -PHQ 9 -Labs Annual wellness visit with Darl Pikes -follow up: With Dr. Selena Batten in 3-4 months, please cancel other appointments and also canceled the annual wellness visit with Darl Pikes in June as he is doing this today  We have ordered labs or studies at this visit. It can take up to 1-2 weeks for results and processing. IF results require follow up or explanation, we will call you with instructions. Clinically stable results will be released to your [facility]. If you have not heard from [place] or cannot find your results in [facility] in 2 weeks please contact our office at [phone].  If you are not yet signed up for [facility], please consider signing  up.   Cut out sweetened beverages.  Avoid sugary and sweet foods.  Avoid white starchy foods.  Please drink water or naturally flavored seltzer without sweetener.  Gradually increase your activity level.  If your blood counts are still off, we may have you see the blood specialist.       Terressa Koyanagi, DO

## 2018-03-08 NOTE — Addendum Note (Signed)
Addended by: Charna ElizabethLEMMONS, STEPHANIE L on: 03/08/2018 01:30 PM   Modules accepted: Orders

## 2018-03-08 NOTE — Patient Instructions (Addendum)
Logan Sanchez , Thank you for taking time to come for your Medicare Wellness Visit. I appreciate your ongoing commitment to your health goals. Please review the following plan we discussed and let me know if I can assist you in the future.   Please have a eye exam soon.  Educated regarding prediabetes and numbers;  A1c ranges from 5.8 to 6.5 or fasting Blood sugar > 115 -126; (126 is diabetic)   Risk: >45yo; family hx; overweight or obese; African American; Hispanic; Latino; American [place]; [place] American; [place] Islander; history of diabetes when pregnant; or birth to a baby weighing over 9 lbs. Being less physically active than 30 minutes; 3 times a week;   Prevention; Losing a modest 7 to 8 lbs; If over 200 lbs; 10 to 14 lbs;  Choose healthier foods; colorful veggies; fish or lean meats; drinks water Reduce portion size Start exercising; 30 minutes of fast walking x 30 minutes per day/ 60 min for weight loss      These are the goals we discussed: Goals    . Weight (lb) < 235 lb (106.6 kg)     Goes back to the [place];  To silver sneaker program; may try the treadmill;  Motivation is an 8 since it is spring        This is a list of the screening recommended for you and due dates:  Health Maintenance  Topic Date Due  . HIV Screening  03/09/2019*  . Flu Shot  06/30/2018  . Tetanus Vaccine  03/17/2027  *Topic was postponed. The date shown is not the original due date.      Fall Prevention in the Home Falls can cause injuries. They can happen to people of all ages. There are many things you can do to make your home safe and to help prevent falls. What can I do on the outside of my home?  Regularly fix the edges of walkways and driveways and fix any cracks.  Remove anything that might make you trip as you walk through a door, such as a raised step or threshold.  Trim any bushes or trees on the path to your home.  Use bright outdoor lighting.  Clear any walking paths of  anything that might make someone trip, such as rocks or tools.  Regularly check to see if handrails are loose or broken. Make sure that both sides of any steps have handrails.  Any raised decks and porches should have guardrails on the edges.  Have any leaves, snow, or ice cleared regularly.  Use sand or salt on walking paths during winter.  Clean up any spills in your garage right away. This includes oil or grease spills. What can I do in the bathroom?  Use night lights.  Install grab bars by the toilet and in the tub and shower. Do not use towel bars as grab bars.  Use non-skid mats or decals in the tub or shower.  If you need to sit down in the shower, use a plastic, non-slip stool.  Keep the floor dry. Clean up any water that spills on the floor as soon as it happens.  Remove soap buildup in the tub or shower regularly.  Attach bath mats securely with double-sided non-slip rug tape.  Do not have throw rugs and other things on the floor that can make you trip. What can I do in the bedroom?  Use night lights.  Make sure that you have a light by your bed that is  easy to reach.  Do not use any sheets or blankets that are too big for your bed. They should not hang down onto the floor.  Have a firm chair that has side arms. You can use this for support while you get dressed.  Do not have throw rugs and other things on the floor that can make you trip. What can I do in the kitchen?  Clean up any spills right away.  Avoid walking on wet floors.  Keep items that you use a lot in easy-to-reach places.  If you need to reach something above you, use a strong step stool that has a grab bar.  Keep electrical cords out of the way.  Do not use floor polish or wax that makes floors slippery. If you must use wax, use non-skid floor wax.  Do not have throw rugs and other things on the floor that can make you trip. What can I do with my stairs?  Do not leave any items on the  stairs.  Make sure that there are handrails on both sides of the stairs and use them. Fix handrails that are broken or loose. Make sure that handrails are as long as the stairways.  Check any carpeting to make sure that it is firmly attached to the stairs. Fix any carpet that is loose or worn.  Avoid having throw rugs at the top or bottom of the stairs. If you do have throw rugs, attach them to the floor with carpet tape.  Make sure that you have a light switch at the top of the stairs and the bottom of the stairs. If you do not have them, ask someone to add them for you. What else can I do to help prevent falls?  Wear shoes that: ? Do not have high heels. ? Have rubber bottoms. ? Are comfortable and fit you well. ? Are closed at the toe. Do not wear sandals.  If you use a stepladder: ? Make sure that it is fully opened. Do not climb a closed stepladder. ? Make sure that both sides of the stepladder are locked into place. ? Ask someone to hold it for you, if possible.  Clearly mark and make sure that you can see: ? Any grab bars or handrails. ? First and last steps. ? Where the edge of each step is.  Use tools that help you move around (mobility aids) if they are needed. These include: ? Canes. ? Walkers. ? Scooters. ? Crutches.  Turn on the lights when you go into a dark area. Replace any light bulbs as soon as they burn out.  Set up your furniture so you have a clear path. Avoid moving your furniture around.  If any of your floors are uneven, fix them.  If there are any pets around you, be aware of where they are.  Review your medicines with your doctor. Some medicines can make you feel dizzy. This can increase your chance of falling. Ask your doctor what other things that you can do to help prevent falls. This information is not intended to replace advice given to you by your health care provider. Make sure you discuss any questions you have with your health care  provider. Document Released: 09/12/2009 Document Revised: 04/23/2016 Document Reviewed: 12/21/2014 Elsevier Interactive Patient Education  2018 [employer]   Health Maintenance, Male A healthy lifestyle and preventive care is important for your health and wellness. Ask your health care provider about what schedule of regular  examinations is right for you. What should I know about weight and diet? Eat a Healthy Diet  Eat plenty of vegetables, fruits, whole grains, low-fat dairy products, and lean protein.  Do not eat a lot of foods high in solid fats, added sugars, or salt.  Maintain a Healthy Weight Regular exercise can help you achieve or maintain a healthy weight. You should:  Do at least 150 minutes of exercise each week. The exercise should increase your heart rate and make you sweat (moderate-intensity exercise).  Do strength-training exercises at least twice a week.  Watch Your Levels of Cholesterol and Blood Lipids  Have your blood tested for lipids and cholesterol every 5 years starting at 45 years of age. If you are at high risk for heart disease, you should start having your blood tested when you are 46 years old. You may need to have your cholesterol levels checked more often if: ? Your lipid or cholesterol levels are high. ? You are older than 46 years of age. ? You are at high risk for heart disease.  What should I know about cancer screening? Many types of cancers can be detected early and may often be prevented. Lung Cancer  You should be screened every year for lung cancer if: ? You are a current smoker who has smoked for at least 30 years. ? You are a former smoker who has quit within the past 15 years.  Talk to your health care provider about your screening options, when you should start screening, and how often you should be screened.  Colorectal Cancer  Routine colorectal cancer screening usually begins at 46 years of age and should be repeated every  5-10 years until you are 46 years old. You may need to be screened more often if early forms of precancerous polyps or small growths are found. Your health care provider may recommend screening at an earlier age if you have risk factors for colon cancer.  Your health care provider may recommend using home test kits to check for hidden blood in the stool.  A small camera at the end of a tube can be used to examine your colon (sigmoidoscopy or colonoscopy). This checks for the earliest forms of colorectal cancer.  Prostate and Testicular Cancer  Depending on your age and overall health, your health care provider may do certain tests to screen for prostate and testicular cancer.  Talk to your health care provider about any symptoms or concerns you have about testicular or prostate cancer.  Skin Cancer  Check your skin from head to toe regularly.  Tell your health care provider about any new moles or changes in moles, especially if: ? There is a change in a mole's size, shape, or color. ? You have a mole that is larger than a pencil eraser.  Always use sunscreen. Apply sunscreen liberally and repeat throughout the day.  Protect yourself by wearing long sleeves, pants, a wide-brimmed hat, and sunglasses when outside.  What should I know about heart disease, diabetes, and high blood pressure?  If you are 61-70 years of age, have your blood pressure checked every 3-5 years. If you are 7 years of age or older, have your blood pressure checked every year. You should have your blood pressure measured twice-once when you are at a hospital or clinic, and once when you are not at a hospital or clinic. Record the average of the two measurements. To check your blood pressure when you are not at a  hospital or clinic, you can use: ? An automated blood pressure machine at a pharmacy. ? A home blood pressure monitor.  Talk to your health care provider about your target blood pressure.  If you are  between 46-13 years old, ask your health care provider if you should take aspirin to prevent heart disease.  Have regular diabetes screenings by checking your fasting blood sugar level. ? If you are at a normal weight and have a low risk for diabetes, have this test once every three years after the age of 52. ? If you are overweight and have a high risk for diabetes, consider being tested at a younger age or more often.  A one-time screening for abdominal aortic aneurysm (AAA) by ultrasound is recommended for men aged 65-75 years who are current or former smokers. What should I know about preventing infection? Hepatitis B If you have a higher risk for hepatitis B, you should be screened for this virus. Talk with your health care provider to find out if you are at risk for hepatitis B infection. Hepatitis C Blood testing is recommended for:  Everyone born from [birth year] through 1964.  Anyone with known risk factors for hepatitis C.  Sexually Transmitted Diseases (STDs)  You should be screened each year for STDs including gonorrhea and chlamydia if: ? You are sexually active and are younger than 46 years of age. ? You are older than 46 years of age and your health care provider tells you that you are at risk for this type of infection. ? Your sexual activity has changed since you were last screened and you are at an increased risk for chlamydia or gonorrhea. Ask your health care provider if you are at risk.  Talk with your health care provider about whether you are at high risk of being infected with HIV. Your health care provider may recommend a prescription medicine to help prevent HIV infection.  What else can I do?  Schedule regular health, dental, and eye exams.  Stay current with your vaccines (immunizations).  Do not use any tobacco products, such as cigarettes, chewing tobacco, and e-cigarettes. If you need help quitting, ask your health care provider.  Limit alcohol intake to no  more than 2 drinks per day. One drink equals 12 ounces of beer, 5 ounces of wine, or 1 ounces of hard liquor.  Do not use street drugs.  Do not share needles.  Ask your health care provider for help if you need support or information about quitting drugs.  Tell your health care provider if you often feel depressed.  Tell your health care provider if you have ever been abused or do not feel safe at home. This information is not intended to replace advice given to you by your health care provider. Make sure you discuss any questions you have with your health care provider. Document Released: 05/14/2008 Document Revised: 07/15/2016 Document Reviewed: 08/20/2015 Elsevier Interactive Patient Education  [employer].

## 2018-03-08 NOTE — Addendum Note (Signed)
Addended by: Charna ElizabethLEMMONS, Marris Frontera L on: 03/08/2018 01:37 PM   Modules accepted: Orders

## 2018-03-08 NOTE — Patient Instructions (Signed)
BEFORE YOU LEAVE: -PHQ 9 -Labs Annual wellness visit with Darl PikesSusan -follow up: With Dr. Selena BattenKim in 3-4 months, please cancel other appointments and also canceled the annual wellness visit with Darl PikesSusan in June as he is doing this today  We have ordered labs or studies at this visit. It can take up to 1-2 weeks for results and processing. IF results require follow up or explanation, we will call you with instructions. Clinically stable results will be [facility] to [facility]. If you have not heard from us or cannot [facility] 2 weeks please contact our office at [phone].  If you are not yet signed up for [facility], please consider signing up.   Cut out sweetened beverages.  Avoid sugary and sweet foods.  Avoid white starchy foods.  Please drink water or naturally flavored seltzer without sweetener.  Gradually increase your activity level.  If your blood counts are still off, we may have you see the blood specialist.

## 2018-03-08 NOTE — Progress Notes (Signed)
Logan Sanchez [name], DO  

## 2018-03-08 NOTE — Progress Notes (Signed)
Subjective:   Logan Sanchez is a 47 y.o. male who presents for Medicare Annual/Subsequent preventive examination.  Mr. Arneson feels he is doing well Has basically stopped ETOH use except on rare occasions. Denies smoking  Diet; cooks  Introduced him to foods with fiber to help him with his cholesterol, as black beans, lentils, substitute avocado for mayo, nuts in moderation as almonds. Etc.  BMI 42- 43;   Exercise  Agrees to go to the silver sneaker program. Motivation is an 51  States he was waiting for spring.  May start walking as well          Objective:    Vitals: There were no vitals taken for this visit.  There is no height or weight on file to calculate BMI.  Advanced Directives 03/08/2018 12/11/2016 12/11/2015 07/26/2015  Does Patient Have a Medical Advance Directive? No Yes No No  Would patient like information on creating a medical advance directive? - - Yes - Educational materials given No - patient declined information   Advanced Directive; Reviewed advanced directive and agreed to receipt of information and discussion.  Focused face to face x  20 minutes discussing HCPOA and Living will and reviewed all the questions in the [facility] forms. The patient voices understanding of HCPOA; LW reviewed and information provided on each question. Educated on how to revoke this HCPOA or LW at any time.   Also  discussed life prolonging measures (given a few examples) and where he could choose to initiate or not;  the ability to given the HCPOA power to change his living will or not if he cannot speak for himself; as well as finalizing the will by 2 unrelated witnesses and notary.  Will call for questions and given information on [facility] pastoral department for further assistance.   Dr. Selena Batten has agreed to educate the patient regarding advanced Directives.    Tobacco Social History   Tobacco Use  Smoking Status Former Smoker  . Packs/day: 1.50  . Years: 25.00  . Pack  years: 37.50  . Types: Cigarettes  . Last attempt to quit: 09/30/2014  . Years since quitting: 3.4  Smokeless Tobacco Never Used     Counseling given: Not Answered   Clinical Intake:    Past Medical History:  Diagnosis Date  . Alcohol abuse   . Angina pectoris (HCC) 12/09/2015  . Brain injury [facility])    report on disability for this, sees Dr. Arvilla Market  . Coronary artery disease    a. cath 12/11/15 99% stenosed mild LAD s/p overlapping drug-eluting stents; mid Cx 80% stenosed s/p Synergy drug-eluting stent; pro RCA 70% - medical managment for now, could consider cath  . Depression   . Elevated LFTs   . Erectile dysfunction   . Hyperlipemia   . Hypertension   . Mood disorder (HCC)    depression and GAD  . Obesity   . Rheumatoid arthritis involving right hip [facility])    Past Surgical History:  Procedure Laterality Date  . CARDIAC CATHETERIZATION N/A 12/11/2015   Procedure: Left Heart Cath and Coronary Angiography;  Surgeon: Corky Crafts, MD;  Location: [facility] INVASIVE CV LAB;  Service: Cardiovascular;  Laterality: N/A;  . CARDIAC CATHETERIZATION N/A 12/11/2015   Procedure: Coronary Stent Intervention;  Surgeon: Corky Crafts, MD;  Location: [facility] INVASIVE CV LAB;  Service: Cardiovascular;  Laterality: N/A;  . CORONARY ANGIOPLASTY    . FOREARM FRACTURE SURGERY Right ~ 1998   "I've got a metal  rod in it"  . FRACTURE SURGERY     Family History  Problem Relation Age of Onset  . Hypertension Mother   . Hypertension Father   . Heart attack Maternal Grandmother   . Hypertension Maternal Grandmother   . Stroke Maternal Grandmother   . Heart attack Maternal Grandfather   . Hypertension Maternal Grandfather   . Hypertension Paternal Grandmother   . Hypertension Paternal Grandfather    Social History   Socioeconomic History  . Marital status: Single    Spouse name: Not on file  . Number of children: Not on file  . Years of education: Not on file  . Highest education level: Not  on file  Occupational History  . Not on file  Social Needs  . Financial resource strain: Not on file  . Food insecurity:    Worry: Not on file    Inability: Not on file  . Transportation needs:    Medical: Not on file    Non-medical: Not on file  Tobacco Use  . Smoking status: Former Smoker    Packs/day: 1.50    Years: 25.00    Pack years: 37.50    Types: Cigarettes    Last attempt to quit: 09/30/2014    Years since quitting: 3.4  . Smokeless tobacco: Never Used  Substance and Sexual Activity  . Alcohol use: Yes    Alcohol/week: 45.6 oz    Types: 23 Cans of beer, 53 Shots of liquor per week    Comment: 12/11/2015 "I drink at least 1 40oz beer/day plus a pint of liquor 5 times/week"  . Drug use: No  . Sexual activity: Not Currently  Lifestyle  . Physical activity:    Days per week: Not on file    Minutes per session: Not on file  . Stress: Not on file  Relationships  . Social connections:    Talks on phone: Not on file    Gets together: Not on file    Attends religious service: Not on file    Active member of club or organization: Not on file    Attends meetings of clubs or organizations: Not on file    Relationship status: Not on file  Other Topics Concern  . Not on file  Social History Narrative   Work or School: on disability for brain injury      Home Situation:       Spiritual Beliefs:      Lifestyle: no regular exercise, poor diet       Outpatient Encounter Medications as of 03/08/2018  Medication Sig  . amLODipine (NORVASC) 10 MG tablet Take 1 tablet (10 mg total) by mouth daily.  Marland Kitchen aspirin EC 81 MG tablet Take 1 tablet (81 mg total) by mouth daily.  Marland Kitchen atorvastatin (LIPITOR) 80 MG tablet Take 1 tablet (80 mg total) by mouth daily.  . clopidogrel (PLAVIX) 75 MG tablet Take 1 tablet (75 mg total) by mouth daily with breakfast.  . Icosapent Ethyl (VASCEPA) 1 g CAPS Take 2 capsules (2 g total) by mouth 2 (two) times daily.  . isosorbide mononitrate (IMDUR) 30  MG 24 hr tablet Take 1 tablet (30 mg total) by mouth daily.  Marland Kitchen losartan-hydrochlorothiazide (HYZAAR) 100-25 MG tablet TAKE 1 TABLET BY MOUTH DAILY.  . metoprolol tartrate (LOPRESSOR) 50 MG tablet Take 1 tablet (50 mg total) 2 (two) times daily by mouth.  . Misc Natural Products (GINSENG COMPLEX PO) Take 1 tablet by mouth daily.  . nitroGLYCERIN (NITROSTAT)  0.4 MG SL tablet Place 1 tablet (0.4 mg total) under the tongue every 5 (five) minutes as needed for chest pain.  . pantoprazole (PROTONIX) 40 MG tablet TAKE 1 TABLET (40 MG TOTAL) BY MOUTH DAILY.  Marland Kitchen PARoxetine (PAXIL) 20 MG tablet Take 1 tablet (20 mg total) by mouth daily.   No facility-administered encounter medications on file as of 03/08/2018.     Activities of Daily Living In your present state of health, do you have any difficulty performing the following activities: 03/08/2018  Hearing? N  Vision? N  Difficulty concentrating or making decisions? N  Managing your Finances? Y  Comment mother helps him with fianance   Some recent data might be hidden    Patient Care Team: Terressa Koyanagi, DO as PCP - General (Family Medicine)   Assessment:   This is a routine wellness examination for Malosi.  Exercise Activities and Dietary recommendations    Goals    . Weight (lb) < 235 lb (106.6 kg)     Goes back to the [place];  To silver sneaker program; may try the treadmill;  Motivation is an 8 since it is spring        Fall Risk Fall Risk  12/11/2016  Falls in the past year? No     Depression Screen PHQ 2/9 Scores 03/08/2018 02/03/2018 10/01/2017 06/15/2017  PHQ - 2 Score 4 1 4  0  PHQ- 9 Score 7 3 7  -    Cognitive Function MMSE - Mini Mental State Exam 12/11/2016  Not completed: (No Data)     Ad8 score reviewed for issues:  Issues making decisions:  Less interest in hobbies / activities:  Repeats questions, stories (family complaining):  Trouble using ordinary gadgets (microwave, computer, phone):  Forgets the month or year:    Mismanaging finances:   Remembering appts:  Daily problems with thinking and/or memory: Ad8 score is=0     Immunization History  Administered Date(s) Administered  . Influenza,inj,Quad PF,6+ Mos 08/13/2014, 08/14/2016, 10/01/2017  . Influenza-Unspecified 08/31/2015  . Td 03/16/2017     Screening Tests Health Maintenance  Topic Date Due  . HIV Screening  03/09/2019 (Originally 05/14/1986)  . INFLUENZA VACCINE  06/30/2018  . TETANUS/TDAP  03/17/2027      Plan:      PCP Notes   Health Maintenance Agrees to go to the silver sneaker class Encouraging him to walk more  Educated regarding pre-diabetes  To schedule his eye exam  Educated and given a list of foods to increase his fiber in his diet to help with his cholesterol  Abnormal Screens  BMI - agrees to try to lose weight  Referrals  none  Patient concerns; As noted   Nurse Concerns; As noted  Next PCP apt Was seen today       I have personally reviewed and noted the following in the patient's chart:   . Medical and social history . Use of alcohol, tobacco or illicit drugs  . Current medications and supplements . Functional ability and status . Nutritional status . Physical activity . Advanced directives . List of other physicians . Hospitalizations, surgeries, and ER visits in previous 12 months . Vitals . Screenings to include cognitive, depression, and falls . Referrals and appointments  In addition, I have reviewed and discussed with patient certain preventive protocols, quality metrics, and best practice recommendations. A written personalized care plan for preventive services as well as general preventive health recommendations were provided to patient.  Montine CircleHauck,Greggory Safranek, RN  03/08/2018

## 2018-03-08 NOTE — Addendum Note (Signed)
Addended by: Charna ElizabethLEMMONS, Millie Shorb L on: 03/08/2018 01:40 PM   Modules accepted: Orders

## 2018-03-09 ENCOUNTER — Telehealth: Payer: Self-pay | Admitting: Interventional Cardiology

## 2018-03-09 NOTE — Telephone Encounter (Signed)
Walk in pt Form-pt dropped of BP Readings placed in Dr.Varanasi doc box

## 2018-03-10 ENCOUNTER — Telehealth: Payer: Self-pay | Admitting: Family Medicine

## 2018-03-10 NOTE — Telephone Encounter (Signed)
Copied from CRM [phone]. Topic: Quick Communication - Lab Results >> Mar 10, 2018  4:30 PM Johnella MoloneyFunderburk, Jo A, CMA wrote: Called patient to inform them of lab results. When patient returns call, triage nurse may disclose results. >> Mar 10, 2018  4:49 PM Louie BunPalacios Medina, Rosey Batheresa D wrote: Patient is calling to get his lab results and returning missed called from the office. Please call patient back, thanks.

## 2018-03-10 NOTE — Telephone Encounter (Signed)
Agree that sounds as though it could be more related to blood sugars given it happens when he is hungry. Statins are not generally associated with the symptoms he is describing. They are more so associated with memory loss. Agree with continuing medications as prescribed and pt should follow up with PCP.

## 2018-03-10 NOTE — Telephone Encounter (Signed)
Walk-in form received from patient. Attached was the patient's BP (118/80) from his OV with his PCP on 03/08/18 (notes in Epic). A message was also attached stating that the patient had been feeling "swimmy headed" and wanted to know if it could be related to his recent medication change. Attempted to contact patient but there was no answer. Left message for patient to call back.   Patient last seen by [facility] on 02/17/18 and atorvastatin was increased from 40 mg to 80 mg QD and fish oil was switched to Vascepa 2 g BID.

## 2018-03-10 NOTE — Telephone Encounter (Signed)
Patient calling back and states that he has had intermittent episodes of feeling "swimmy headed" for the past 2 weeks and wanted to know if it could be related to his recent medication change. Patient last seen by [facility] on 02/17/18 and atorvastatin was increased from 40 mg to 80 mg QD and fish oil was switched to Vascepa 2 g BID. I asked the patient to elaborate more on "swimmy headed" and he states that he has brief episodes of feeling a little dizzy and unable to focus, like he just stepped off of a merry go round. He denies having any memory issues, lightheadedness, syncope, or any other symptoms. Patient states that these episodes can happen with activity or at rest and last for about 30 seconds. Made patient aware that these symptoms are not likely to be related to the atorvastatin or Vascepa. Patient states that with further thought it seems to happen when he gets hungry. Instructed patient to continue taking his atorvastatin and Vascepa. Made patient aware that I would forward to the [facility] for review and that we would call him back if there were any additional recommendations. Patient verbalized understanding and thanked me for the call.

## 2018-03-10 NOTE — Telephone Encounter (Signed)
Pt given lab results and documented in result note.  

## 2018-03-11 MED ORDER — ALLOPURINOL 100 MG PO TABS: 100.0000 mg | ORAL_TABLET | Freq: Every day | ORAL | 3 refills | Status: DC

## 2018-03-11 NOTE — Telephone Encounter (Signed)
Patient aware.

## 2018-03-11 NOTE — Addendum Note (Signed)
Addended by: Johnella MoloneyFUNDERBURK, Briante Loveall A on: 03/11/2018 01:28 PM   Modules accepted: Orders

## 2018-03-11 NOTE — Addendum Note (Signed)
Addended by: Johnella MoloneyFUNDERBURK, Makennah Omura A on: 03/11/2018 01:27 PM   Modules accepted: Orders

## 2018-03-24 ENCOUNTER — Other Ambulatory Visit: Payer: Self-pay | Admitting: Family Medicine

## 2018-03-24 ENCOUNTER — Other Ambulatory Visit (INDEPENDENT_AMBULATORY_CARE_PROVIDER_SITE_OTHER): Payer: Medicare HMO

## 2018-03-24 DIAGNOSIS — M2550 Pain in unspecified joint: Secondary | ICD-10-CM

## 2018-03-24 DIAGNOSIS — E790 Hyperuricemia without signs of inflammatory arthritis and tophaceous disease: Secondary | ICD-10-CM

## 2018-03-24 LAB — URIC ACID: URIC ACID, SERUM: 8.4 mg/dL — AB (ref 4.0–7.8)

## 2018-03-24 MED ORDER — ALLOPURINOL 100 MG PO TABS: ORAL_TABLET | ORAL | 0 refills | Status: AC

## 2018-04-08 NOTE — Progress Notes (Deleted)
Cardiology Office Note   Date:  04/08/2018   ID:  Logan Sanchez, DOB [birth year], MRN [id number]  PCP:  Logan Koyanagi, DO    No chief complaint on file.    Wt Readings from Last 3 Encounters:  03/08/18 286 lb (129.7 kg)  03/08/18 286 lb 3.2 oz (129.8 kg)  02/03/18 295 lb 12.8 oz (134.2 kg)       History of Present Illness: [name] is a 47 y.o. male  Who has CAD. He had a cath in Jan 2017. Cath showed 99% stenosed mild LAD s/p overlapping drug-eluting stents; mid Cx 80% stenosed s/p Synergy drug-eluting stent. DAPT for at least year but probably indefiniteclopidogrelgiven diffuse coronary disease. We consideredbringing him back for further evaluation of the proximal right coronary lesion which appeared to have some spontaneous dissection. No angina so he has been medically managed.  Alcohol use has been an issue in the past. He was drinking less. He has decreased fried foods as well. He was  trying to lose weight. He has lost 10 lbs in 2018.  He had gained the weight back.  On disability from a prior head injury.  Has been on beta blocker for tachycardia, increased to metoprolol 50 BID in 5/18.     Past Medical History:  Diagnosis Date  . Alcohol abuse   . Angina pectoris (HCC) 12/09/2015  . Brain injury [facility])    report on disability for this, sees Dr. Arvilla Market  . Coronary artery disease    a. cath 12/11/15 99% stenosed mild LAD s/p overlapping drug-eluting stents; mid Cx 80% stenosed s/p Synergy drug-eluting stent; pro RCA 70% - medical managment for now, could consider cath  . Depression   . Elevated LFTs   . Erectile dysfunction   . Hyperlipemia   . Hypertension   . Mood disorder (HCC)    depression and GAD  . Obesity   . Rheumatoid arthritis involving right hip [facility])     Past Surgical History:  Procedure Laterality Date  . CARDIAC CATHETERIZATION N/A 12/11/2015   Procedure: Left Heart Cath and Coronary Angiography;  Surgeon: Corky Crafts, MD;  Location: [facility] INVASIVE CV LAB;  Service: Cardiovascular;  Laterality: N/A;  . CARDIAC CATHETERIZATION N/A 12/11/2015   Procedure: Coronary Stent Intervention;  Surgeon: Corky Crafts, MD;  Location: [facility] INVASIVE CV LAB;  Service: Cardiovascular;  Laterality: N/A;  . CORONARY ANGIOPLASTY    . FOREARM FRACTURE SURGERY Right ~ 1998   "I've got a metal rod in it"  . FRACTURE SURGERY       Current Outpatient Medications  Medication Sig Dispense Refill  . allopurinol (ZYLOPRIM) 100 MG tablet Take 2 tabs daily 60 tablet 0  . amLODipine (NORVASC) 10 MG tablet Take 1 tablet (10 mg total) by mouth daily. 90 tablet 3  . aspirin EC 81 MG tablet Take 1 tablet (81 mg total) by mouth daily. 90 tablet 3  . atorvastatin (LIPITOR) 80 MG tablet Take 1 tablet (80 mg total) by mouth daily. 90 tablet 3  . clopidogrel (PLAVIX) 75 MG tablet Take 1 tablet (75 mg total) by mouth daily with breakfast. 90 tablet 1  . Icosapent Ethyl (VASCEPA) 1 g CAPS Take 2 capsules (2 g total) by mouth 2 (two) times daily. 120 capsule 11  . isosorbide mononitrate (IMDUR) 30 MG 24 hr tablet Take 1 tablet (30 mg total) by mouth daily. 90 tablet 2  . losartan-hydrochlorothiazide (HYZAAR) 100-25 MG tablet TAKE 1  TABLET BY MOUTH DAILY. 90 tablet 1  . metoprolol tartrate (LOPRESSOR) 50 MG tablet Take 1 tablet (50 mg total) 2 (two) times daily by mouth. 180 tablet 3  . Misc Natural Products (GINSENG COMPLEX PO) Take 1 tablet by mouth daily.    . nitroGLYCERIN (NITROSTAT) 0.4 MG SL tablet Place 1 tablet (0.4 mg total) under the tongue every 5 (five) minutes as needed for chest pain. 25 tablet 3  . pantoprazole (PROTONIX) 40 MG tablet TAKE 1 TABLET (40 MG TOTAL) BY MOUTH DAILY. 30 tablet 6  . PARoxetine (PAXIL) 20 MG tablet Take 1 tablet (20 mg total) by mouth daily. 90 tablet 1   No current facility-administered medications for this visit.     Allergies:   Patient has no known allergies.    Social History:  The  patient  reports that he quit smoking about 3 years ago. His smoking use included cigarettes. He has a 37.50 pack-year smoking history. He has never used smokeless tobacco. He reports that he drinks about 45.6 oz of alcohol per week. He reports that he does not use drugs.   Family History:  The patient's ***family history includes Heart attack in his maternal grandfather and maternal grandmother; Hypertension in his father, maternal grandfather, maternal grandmother, mother, paternal grandfather, and paternal grandmother; Stroke in his maternal grandmother.    ROS:  Please see the history of present illness.   Otherwise, review of systems are positive for ***.   All other systems are reviewed and negative.    PHYSICAL EXAM: VS:  There were no vitals taken for this visit. , BMI There is no height or weight on file to calculate BMI. GEN: Well nourished, well developed, in no acute distress  HEENT: normal  Neck: no JVD, carotid bruits, or masses Cardiac: ***RRR; no murmurs, rubs, or gallops,no edema  Respiratory:  clear to auscultation bilaterally, normal work of breathing GI: soft, nontender, nondistended, + BS MS: no deformity or atrophy  Skin: warm and dry, no rash Neuro:  Strength and sensation are intact Psych: euthymic mood, full affect   EKG:   The ekg ordered today demonstrates ***   Recent Labs: 01/10/2018: ALT 38 02/03/2018: BUN 10; Creatinine, Ser 0.77; Potassium 3.6; Sodium 139 03/08/2018: Hemoglobin 14.1; Platelets 285.0   Lipid Panel    Component Value Date/Time   CHOL 187 01/10/2018 1159   TRIG 375 (H) 01/10/2018 1159   HDL 33 (L) 01/10/2018 1159   CHOLHDL 5.7 (H) 01/10/2018 1159   CHOLHDL 6 10/01/2017 1055   VLDL 48.6 (H) 10/01/2017 1055   LDLCALC 79 01/10/2018 1159   LDLDIRECT 177.0 10/01/2017 1055     Other studies Reviewed: Additional studies/ records that were reviewed today with results demonstrating: ***.   ASSESSMENT AND  PLAN:  1. CAD: 2. Hyperlipidemia: 3. Tachycardia: 4. HTN:   Current medicines are reviewed at length with the patient today.  The patient concerns regarding his medicines were addressed.  The following changes have been made:  No change***  Labs/ tests ordered today include: *** No orders of the defined types were placed in this encounter.   Recommend 150 minutes/week of aerobic exercise Low fat, low carb, high fiber diet recommended  Disposition:   FU in ***   Signed, Lance Muss, MD  04/08/2018 12:43 PM    [facility] [address] [place], [place], [place]  [address] Phone: [phone]; Fax: [phone]

## 2018-04-11 ENCOUNTER — Encounter: Payer: Self-pay | Admitting: Interventional Cardiology

## 2018-04-11 ENCOUNTER — Ambulatory Visit: Payer: Self-pay | Admitting: Interventional Cardiology

## 2018-04-11 ENCOUNTER — Ambulatory Visit (INDEPENDENT_AMBULATORY_CARE_PROVIDER_SITE_OTHER): Payer: Medicare HMO | Admitting: Interventional Cardiology

## 2018-04-11 VITALS — BP 118/78 | HR 102 | Ht 69.0 in | Wt 281.0 lb

## 2018-04-11 DIAGNOSIS — E782 Mixed hyperlipidemia: Secondary | ICD-10-CM

## 2018-04-11 DIAGNOSIS — I10 Essential (primary) hypertension: Secondary | ICD-10-CM

## 2018-04-11 DIAGNOSIS — I25119 Atherosclerotic heart disease of native coronary artery with unspecified angina pectoris: Secondary | ICD-10-CM

## 2018-04-11 NOTE — Progress Notes (Signed)
Cardiology Office Note   Date:  04/11/2018   ID:  Logan Sanchez, DOB Apr 19, 1971, MRN [id number]  PCP:  Terressa Koyanagi, DO    No chief complaint on file.  CAD  Wt Readings from Last 3 Encounters:  04/11/18 281 lb (127.5 kg)  03/08/18 286 lb (129.7 kg)  03/08/18 286 lb 3.2 oz (129.8 kg)       History of Present Illness: Logan Sanchez is a 46 y.o. male  Who has CAD. He had a cath in Jan 2017. Cath showed 99% stenosed mild LAD s/p overlapping drug-eluting stents; mid Cx 80% stenosed s/p Synergy drug-eluting stent. DAPT for at least year but probably indefiniteclopidogrelgiven diffuse coronary disease. We consideredbringing him back for further evaluation of the proximal right coronary lesion which appeared to have some spontaneous dissection. No angina so he has been medically managed.  Alcohol use has been an issue in the past. He is drinking less. He has decreased fried foods as well. He is walking more, trying to lose weight. He has lost 10 lbs in 2018.  He had gained the weight back.  Denies : Chest pain.  Leg edema. Nitroglycerin use. Orthopnea. Palpitations. Paroxysmal nocturnal dyspnea. Shortness of breath. Syncope.   He can have intermittent dizziness.  It is worse while sitting.  It does not occur with standing. No syncope.   He tries to stay well hydrated.  He drinks only one beer /week.    Past Medical History:  Diagnosis Date  . Alcohol abuse   . Angina pectoris (HCC) 12/09/2015  . Brain injury [facility])    report on disability for this, sees Dr. Arvilla Market  . Coronary artery disease    a. cath 12/11/15 99% stenosed mild LAD s/p overlapping drug-eluting stents; mid Cx 80% stenosed s/p Synergy drug-eluting stent; pro RCA 70% - medical managment for now, could consider cath  . Depression   . Elevated LFTs   . Erectile dysfunction   . Hyperlipemia   . Hypertension   . Mood disorder (HCC)    depression and GAD  . Obesity   . Rheumatoid arthritis involving  right hip [facility])     Past Surgical History:  Procedure Laterality Date  . CARDIAC CATHETERIZATION N/A 12/11/2015   Procedure: Left Heart Cath and Coronary Angiography;  Surgeon: Corky Crafts, MD;  Location: [facility] INVASIVE CV LAB;  Service: Cardiovascular;  Laterality: N/A;  . CARDIAC CATHETERIZATION N/A 12/11/2015   Procedure: Coronary Stent Intervention;  Surgeon: Corky Crafts, MD;  Location: [facility] INVASIVE CV LAB;  Service: Cardiovascular;  Laterality: N/A;  . CORONARY ANGIOPLASTY    . FOREARM FRACTURE SURGERY Right ~ 1998   "I've got a metal rod in it"  . FRACTURE SURGERY       Current Outpatient Medications  Medication Sig Dispense Refill  . allopurinol (ZYLOPRIM) 100 MG tablet Take 2 tabs daily 60 tablet 0  . amLODipine (NORVASC) 10 MG tablet Take 1 tablet (10 mg total) by mouth daily. 90 tablet 3  . aspirin EC 81 MG tablet Take 1 tablet (81 mg total) by mouth daily. 90 tablet 3  . atorvastatin (LIPITOR) 80 MG tablet Take 1 tablet (80 mg total) by mouth daily. 90 tablet 3  . clopidogrel (PLAVIX) 75 MG tablet Take 1 tablet (75 mg total) by mouth daily with breakfast. 90 tablet 1  . isosorbide mononitrate (IMDUR) 30 MG 24 hr tablet Take 1 tablet (30 mg total) by mouth daily. 90 tablet 2  .  losartan-hydrochlorothiazide (HYZAAR) 100-25 MG tablet TAKE 1 TABLET BY MOUTH DAILY. 90 tablet 1  . Misc Natural Products (GINSENG COMPLEX PO) Take 1 tablet by mouth daily.    . nitroGLYCERIN (NITROSTAT) 0.4 MG SL tablet Place 1 tablet (0.4 mg total) under the tongue every 5 (five) minutes as needed for chest pain. 25 tablet 3  . pantoprazole (PROTONIX) 40 MG tablet TAKE 1 TABLET (40 MG TOTAL) BY MOUTH DAILY. 30 tablet 6  . PARoxetine (PAXIL) 20 MG tablet Take 1 tablet (20 mg total) by mouth daily. 90 tablet 1  . metoprolol tartrate (LOPRESSOR) 50 MG tablet Take 1 tablet (50 mg total) 2 (two) times daily by mouth. 180 tablet 3   No current facility-administered medications for this visit.      Allergies:   Patient has no known allergies.    Social History:  The patient  reports that he quit smoking about 3 years ago. His smoking use included cigarettes. He has a 37.50 pack-year smoking history. He has never used smokeless tobacco. He reports that he drinks about 45.6 oz of alcohol per week. He reports that he does not use drugs.   Family History:  The patient's family history includes Heart attack in his maternal grandfather and maternal grandmother; Hypertension in his father, maternal grandfather, maternal grandmother, mother, paternal grandfather, and paternal grandmother; Stroke in his maternal grandmother.    ROS:  Please see the history of present illness.   Otherwise, review of systems are positive for diarrhea if he does not drink.   All other systems are reviewed and negative.    PHYSICAL EXAM: VS:  BP 118/78   Pulse (!) 102   Ht  (1.753 m)   Wt 281 lb (127.5 kg)   SpO2 95%   BMI 41.50 kg/m  , BMI Body mass index is 41.5 kg/m. GEN: Well nourished, well developed, in no acute distress  HEENT: normal  Neck: no JVD, carotid bruits, or masses Cardiac: RRR; no murmurs, rubs, or gallops,no edema  Respiratory:  clear to auscultation bilaterally, normal work of breathing GI: soft, nontender, nondistended, + BS, obese MS: no deformity or atrophy  Skin: warm and dry, no rash Neuro:  Strength and sensation are intact Psych: euthymic mood, full affect    Recent Labs: 01/10/2018: ALT 38 02/03/2018: BUN 10; Creatinine, Ser 0.77; Potassium 3.6; Sodium 139 03/08/2018: Hemoglobin 14.1; Platelets 285.0   Lipid Panel    Component Value Date/Time   CHOL 187 01/10/2018 1159   TRIG 375 (H) 01/10/2018 1159   HDL 33 (L) 01/10/2018 1159   CHOLHDL 5.7 (H) 01/10/2018 1159   CHOLHDL 6 10/01/2017 1055   VLDL 48.6 (H) 10/01/2017 1055   LDLCALC 79 01/10/2018 1159   LDLDIRECT 177.0 10/01/2017 1055     Other studies Reviewed: Additional studies/ records that were reviewed  today with results demonstrating: LDL 77 in 2/19.   ASSESSMENT AND PLAN:  1. CAD: COntinue aggressive secondary prevention.  Sx controlled on medical therapy.  TG are elevated.  He was prescribed Vascepa, but it was too expensive.  REsidual CAD in RCA managed medically.  2. HTN: Controlled.  Unsure if he is getting orthostatic as the cause of his dizziness.  WIll check echo to look for structural heart disease given CAD.  3. Hyperlipidemia: TG elevated. COntinue statin.  Will check with PharmD what alternative would be less expensive than Vascepa.  4. Morbid obesity: he would benefit from weight loss.    Current medicines are reviewed  at length with the patient today.  The patient concerns regarding his medicines were addressed.  The following changes have been made:  No change  Labs/ tests ordered today include:  No orders of the defined types were placed in this encounter.   Recommend 150 minutes/week of aerobic exercise Low fat, low carb, high fiber diet recommended  Disposition:   FU in 1 year   Signed, Lance Muss, MD  04/11/2018 3:41 PM    [facility] [address] [place], [place], [place]  [address] Phone: [phone]; Fax: [phone]

## 2018-04-11 NOTE — Patient Instructions (Signed)
Medication Instructions:  Your physician recommends that you continue on your current medications as directed. Please refer to the Current Medication list given to you today.   Labwork: Keep Lab appointment on 05/19/18  Testing/Procedures: Your physician has requested that you have an echocardiogram. Echocardiography is a painless test that uses sound waves to create images of your heart. It provides your doctor with information about the size and shape of your heart and how well your heart's chambers and valves are working. This procedure takes approximately one hour. There are no restrictions for this procedure.  Follow-Up: Your physician wants you to follow-up in: 6 months with Dr. Eldridge Dace. You will receive a reminder letter in the mail two months in advance. If you don't receive a letter, please call our office to schedule the follow-up appointment.   Any Other Special Instructions Will Be Listed Below (If Applicable).  Echocardiogram An echocardiogram, or echocardiography, uses sound waves (ultrasound) to produce an image of your heart. The echocardiogram is simple, painless, obtained within a short period of time, and offers valuable information to your health care provider. The images from an echocardiogram can provide information such as:  Evidence of coronary artery disease (CAD).  Heart size.  Heart muscle function.  Heart valve function.  Aneurysm detection.  Evidence of a past heart attack.  Fluid buildup around the heart.  Heart muscle thickening.  Assess heart valve function.  Tell a health care provider about:  Any allergies you have.  All medicines you are taking, including vitamins, herbs, eye drops, creams, and over-the-counter medicines.  Any problems you or family members have had with anesthetic medicines.  Any blood disorders you have.  Any surgeries you have had.  Any medical conditions you have.  Whether you are pregnant or may be  pregnant. What happens before the procedure? No special preparation is needed. Eat and drink normally. What happens during the procedure?  In order to produce an image of your heart, gel will be applied to your chest and a wand-like tool (transducer) will be moved over your chest. The gel will help transmit the sound waves from the transducer. The sound waves will harmlessly bounce off your heart to allow the heart images to be captured in real-time motion. These images will then be recorded.  You may need an IV to receive a medicine that improves the quality of the pictures. What happens after the procedure? You may return to your normal schedule including diet, activities, and medicines, unless your health care provider tells you otherwise. This information is not intended to replace advice given to you by your health care provider. Make sure you discuss any questions you have with your health care provider. Document Released: 11/13/2000 Document Revised: 07/04/2016 Document Reviewed: 07/24/2013 Elsevier Interactive Patient Education  2017 [employer].    If you need a refill on your cardiac medications before your next appointment, please call your pharmacy.

## 2018-04-26 ENCOUNTER — Other Ambulatory Visit: Payer: Self-pay

## 2018-04-26 ENCOUNTER — Other Ambulatory Visit (INDEPENDENT_AMBULATORY_CARE_PROVIDER_SITE_OTHER): Payer: Medicare HMO

## 2018-04-26 ENCOUNTER — Encounter (HOSPITAL_COMMUNITY): Payer: Self-pay | Admitting: *Deleted

## 2018-04-26 ENCOUNTER — Ambulatory Visit (HOSPITAL_COMMUNITY): Payer: Medicare HMO | Attending: Cardiology

## 2018-04-26 DIAGNOSIS — Z8249 Family history of ischemic heart disease and other diseases of the circulatory system: Secondary | ICD-10-CM | POA: Diagnosis not present

## 2018-04-26 DIAGNOSIS — Z6841 Body Mass Index (BMI) 40.0 and over, adult: Secondary | ICD-10-CM | POA: Diagnosis not present

## 2018-04-26 DIAGNOSIS — E785 Hyperlipidemia, unspecified: Secondary | ICD-10-CM | POA: Diagnosis not present

## 2018-04-26 DIAGNOSIS — E790 Hyperuricemia without signs of inflammatory arthritis and tophaceous disease: Secondary | ICD-10-CM

## 2018-04-26 DIAGNOSIS — Z7289 Other problems related to lifestyle: Secondary | ICD-10-CM | POA: Insufficient documentation

## 2018-04-26 DIAGNOSIS — Z87891 Personal history of nicotine dependence: Secondary | ICD-10-CM | POA: Diagnosis not present

## 2018-04-26 DIAGNOSIS — I25119 Atherosclerotic heart disease of native coronary artery with unspecified angina pectoris: Secondary | ICD-10-CM

## 2018-04-26 DIAGNOSIS — I10 Essential (primary) hypertension: Secondary | ICD-10-CM | POA: Diagnosis not present

## 2018-04-26 LAB — URIC ACID: URIC ACID, SERUM: 8.8 mg/dL — AB (ref 4.0–7.8)

## 2018-04-26 NOTE — Progress Notes (Unsigned)
Logan Sanchez's Heart Rate during Echocardiogram is between 120-130 consistently.  Echo was performed for evidence of rate.  Definity Contrast is a possibility for image enhancement but may not be needed if heart rate were controlled.  Consider repeating limited echo for Ejection fraction once heart rate is managed.  Please Advise if further imaging is needed post HR management.  Farrel Conners, RDCS

## 2018-05-12 ENCOUNTER — Ambulatory Visit: Payer: Self-pay | Admitting: Family Medicine

## 2018-05-12 ENCOUNTER — Ambulatory Visit: Payer: Self-pay

## 2018-05-19 ENCOUNTER — Other Ambulatory Visit: Payer: Self-pay

## 2018-05-26 ENCOUNTER — Telehealth: Payer: Self-pay | Admitting: Pharmacist

## 2018-05-26 NOTE — Telephone Encounter (Signed)
LMOM x3 for pt regarding missed lab appt to check cholesterol.

## 2018-05-27 ENCOUNTER — Telehealth: Payer: Self-pay | Admitting: *Deleted

## 2018-05-27 NOTE — Telephone Encounter (Signed)
  05/30/2018-I left a detailed message for the pt to call back and schedule a follow up visit in 1 month. Ronnald CollumJo Anne     Copied from CRM [phone]#[id number]. Topic: General - Other >> May 27, 2018  1:14 PM Elliot GaultBell, Tiffany M wrote: Relation to pt: self  Call back number: [phone]   Reason for call:  Patient states at last lab appointment he over reacted and gave the phelebomist a hard time due to being fearful of being pricked, patient would like to apologize. Patient would like a follow up regarding when PCP wants him to follow up, please advise

## 2018-05-28 NOTE — Telephone Encounter (Signed)
Please schedule follow up with me in next 1 month.

## 2018-06-03 ENCOUNTER — Other Ambulatory Visit: Payer: Medicare HMO | Admitting: *Deleted

## 2018-06-03 DIAGNOSIS — E782 Mixed hyperlipidemia: Secondary | ICD-10-CM | POA: Diagnosis not present

## 2018-06-03 LAB — LIPID PANEL
CHOLESTEROL TOTAL: 236 mg/dL — AB (ref 100–199)
Chol/HDL Ratio: 5.9 ratio — ABNORMAL HIGH (ref 0.0–5.0)
HDL: 40 mg/dL (ref >39)
LDL CALC: 123 mg/dL — AB (ref 0–99)
Triglycerides: 365 mg/dL — ABNORMAL HIGH (ref 0–149)
VLDL CHOLESTEROL CAL: 73 mg/dL — AB (ref 5–40)

## 2018-06-03 LAB — LDL CHOLESTEROL, DIRECT: LDL Direct: 142 mg/dL — ABNORMAL HIGH (ref 0–99)

## 2018-06-06 ENCOUNTER — Other Ambulatory Visit: Payer: Self-pay | Admitting: Family Medicine

## 2018-06-12 NOTE — Progress Notes (Signed)
HPI:  Using dictation device. Unfortunately this device frequently misinterprets words/phrases.  Logan Sanchez is a pleasant 47 y.o. here for follow up. Chronic medical problems summarized below were reviewed for changes and stability and were updated as needed below. These issues and their treatment remain stable for the most part.  Doing ok. No complaints today. No gout symptoms.  Denies CP, SOB, DOE, treatment intolerance or new symptoms. Reports drinks lots of soda and diet could be better. Is trying to eat more salads and chili instead of burger from [place]. Does not like to get labs.  AWV 03/03/18  CAD, HTN, HLD: -sees cardiologist - Dr. Eldridge Dace -Referred to lipid clinic by his cardiologist - appreciate recs/care -s/p DES x2 in 2017 -meds: asa, norvasc, lipitor, plavix, fish oil, imdur, losartan-hctz, metoprolol  Morbid Obesity/Hyperglycemia: -have provided him with extensive lifestyle recommendations and other options for wt reduction -he was able to loose some weight in 2018  Hx tobacco use, alcohol abuse, depression and anxiety: -quit smoking -cut back on alcohol -meds: paxil 20mg   GERD: -meds: protonix  Gout: -meds: allopurinol   ROS: See pertinent positives and negatives per HPI.  Past Medical History:  Diagnosis Date  . Alcohol abuse   . Angina pectoris (HCC) 12/09/2015  . Brain injury [facility])    report on disability for this, sees Dr. Arvilla Market  . Coronary artery disease    a. cath 12/11/15 99% stenosed mild LAD s/p overlapping drug-eluting stents; mid Cx 80% stenosed s/p Synergy drug-eluting stent; pro RCA 70% - medical managment for now, could consider cath  . Depression   . Elevated LFTs   . Erectile dysfunction   . Hyperlipemia   . Hypertension   . Mood disorder (HCC)    depression and GAD  . Obesity   . Rheumatoid arthritis involving right hip [facility])     Past Surgical History:  Procedure Laterality Date  . CARDIAC CATHETERIZATION N/A 12/11/2015   Procedure: Left Heart Cath and Coronary Angiography;  Surgeon: Corky Crafts, MD;  Location: [facility] INVASIVE CV LAB;  Service: Cardiovascular;  Laterality: N/A;  . CARDIAC CATHETERIZATION N/A 12/11/2015   Procedure: Coronary Stent Intervention;  Surgeon: Corky Crafts, MD;  Location: [facility] INVASIVE CV LAB;  Service: Cardiovascular;  Laterality: N/A;  . CORONARY ANGIOPLASTY    . FOREARM FRACTURE SURGERY Right ~ 1998   "I've got a metal rod in it"  . FRACTURE SURGERY      Family History  Problem Relation Age of Onset  . Hypertension Mother   . Hypertension Father   . Heart attack Maternal Grandmother   . Hypertension Maternal Grandmother   . Stroke Maternal Grandmother   . Heart attack Maternal Grandfather   . Hypertension Maternal Grandfather   . Hypertension Paternal Grandmother   . Hypertension Paternal Grandfather     SOCIAL HX: see hpi - not smoking, sig reduction in alcohol   Current Outpatient Medications:  .  allopurinol (ZYLOPRIM) 100 MG tablet, Take 2 tabs daily, Disp: 60 tablet, Rfl: 0 .  amLODipine (NORVASC) 10 MG tablet, Take 1 tablet (10 mg total) by mouth daily., Disp: 90 tablet, Rfl: 3 .  aspirin EC 81 MG tablet, Take 1 tablet (81 mg total) by mouth daily., Disp: 90 tablet, Rfl: 3 .  clopidogrel (PLAVIX) 75 MG tablet, Take 1 tablet (75 mg total) by mouth daily with breakfast., Disp: 90 tablet, Rfl: 1 .  isosorbide mononitrate (IMDUR) 30 MG 24 hr tablet, Take 1 tablet (30 mg total)  by mouth daily., Disp: 90 tablet, Rfl: 2 .  losartan-hydrochlorothiazide (HYZAAR) 100-25 MG tablet, TAKE 1 TABLET BY MOUTH DAILY., Disp: 90 tablet, Rfl: 0 .  Misc Natural Products (GINSENG COMPLEX PO), Take 1 tablet by mouth daily., Disp: , Rfl:  .  nitroGLYCERIN (NITROSTAT) 0.4 MG SL tablet, Place 1 tablet (0.4 mg total) under the tongue every 5 (five) minutes as needed for chest pain., Disp: 25 tablet, Rfl: 3 .  pantoprazole (PROTONIX) 40 MG tablet, TAKE 1 TABLET (40 MG TOTAL) BY MOUTH  DAILY., Disp: 30 tablet, Rfl: 6 .  PARoxetine (PAXIL) 20 MG tablet, Take 1 tablet (20 mg total) by mouth daily., Disp: 90 tablet, Rfl: 1 .  atorvastatin (LIPITOR) 80 MG tablet, Take 1 tablet (80 mg total) by mouth daily., Disp: 90 tablet, Rfl: 3 .  metoprolol tartrate (LOPRESSOR) 50 MG tablet, Take 1 tablet (50 mg total) 2 (two) times daily by mouth., Disp: 180 tablet, Rfl: 3  EXAM:  Vitals:   06/13/18 1107  BP: 120/84  Pulse: (!) 117  Temp: 98.6 F (37 C)    Body mass index is 43.11 kg/m.  GENERAL: vitals reviewed and listed above, alert, oriented, appears well hydrated and in no acute distress  HEENT: atraumatic, conjunttiva clear, no obvious abnormalities on inspection of external nose and ears  NECK: no obvious masses on inspection  LUNGS: clear to auscultation bilaterally, no wheezes, rales or rhonchi, good air movement  CV: HRRR, no peripheral edema  MS: moves all extremities without noticeable abnormality  PSYCH: pleasant and cooperative, no obvious depression or anxiety  ASSESSMENT AND PLAN:  Discussed the following assessment and plan:  Morbid obesity (HCC)  Essential hypertension  Mixed hyperlipidemia  Hyperglycemia  Coronary artery disease involving native coronary artery of native heart with angina pectoris (HCC)  Recurrent major depressive disorder, in full remission (HCC)  -lifestyle recs at length - encouraged healthy low sugar diet -advised to cut out sodas and try to transition to water only or flavor his water with fresh fruit -advised against processed foods -discussed easy ways to add whole foods to his diet and increase veggies and lean proteins -advised to gradually increase exercise -opted to hold off on labs until next visit -Patient advised to return return sooner if new concerns arise.  Patient Instructions  BEFORE YOU LEAVE: -follow up: 3 months  Cut out sodas! Drink water :)  Eat 5-7 serving of vegetables per day and lean  proteins.  Avoid sugar and simple carbs.  Increase exercise to 20-30 minutes 4-5 days per week.  Shoot for a 10 lb wt reduction over the next 3-6 months.      Terressa KoyanagiHannah R Suzan Manon, DO

## 2018-06-13 ENCOUNTER — Ambulatory Visit (INDEPENDENT_AMBULATORY_CARE_PROVIDER_SITE_OTHER): Payer: Medicare HMO | Admitting: Family Medicine

## 2018-06-13 ENCOUNTER — Encounter: Payer: Self-pay | Admitting: Family Medicine

## 2018-06-13 DIAGNOSIS — I25119 Atherosclerotic heart disease of native coronary artery with unspecified angina pectoris: Secondary | ICD-10-CM | POA: Diagnosis not present

## 2018-06-13 DIAGNOSIS — I10 Essential (primary) hypertension: Secondary | ICD-10-CM | POA: Diagnosis not present

## 2018-06-13 DIAGNOSIS — F3342 Major depressive disorder, recurrent, in full remission: Secondary | ICD-10-CM

## 2018-06-13 DIAGNOSIS — R739 Hyperglycemia, unspecified: Secondary | ICD-10-CM

## 2018-06-13 DIAGNOSIS — E782 Mixed hyperlipidemia: Secondary | ICD-10-CM

## 2018-06-13 DIAGNOSIS — Z6841 Body Mass Index (BMI) 40.0 and over, adult: Secondary | ICD-10-CM | POA: Diagnosis not present

## 2018-06-13 NOTE — Patient Instructions (Signed)
BEFORE YOU LEAVE: -follow up: 3 months  Cut out sodas! Drink water :)  Eat 5-7 serving of vegetables per day and lean proteins.  Avoid sugar and simple carbs.  Increase exercise to 20-30 minutes 4-5 days per week.  Shoot for a 10 lb wt reduction over the next 3-6 months.

## 2018-06-16 ENCOUNTER — Telehealth: Payer: Self-pay | Admitting: Pharmacist

## 2018-06-16 NOTE — Telephone Encounter (Signed)
-----   Message from Lattie HawBrittany I Currie, RN sent at 06/03/2018  4:08 PM EDT ----- Preliminary reviewed by nurse, awaiting MD signature Forwarding to Lipid Clinic, as they are managing patient's cholesterol

## 2018-06-16 NOTE — Telephone Encounter (Signed)
Have left several messages for patient about increased dose of atorvastatin and have not been able to contact the patient. Will await call back.

## 2018-09-13 ENCOUNTER — Ambulatory Visit: Payer: Medicare HMO | Admitting: Family Medicine

## 2018-09-13 DIAGNOSIS — Z0289 Encounter for other administrative examinations: Secondary | ICD-10-CM

## 2018-09-19 NOTE — Progress Notes (Signed)
HPI:  Using dictation device. Unfortunately this device frequently misinterprets words/phrases.  Logan Sanchez is a pleasant 47 y.o. here for follow up. Chronic medical problems summarized below were reviewed for changes and stability and were updated as needed below. These issues and their treatment remain stable for the most part.  Doing well. ? Gout flare 3 weeks ago L ankle. Mild. Now resolved. Had been drinking. Taking allopurinol 200mg  daily.  Drinking 2 nights per week. 20oz beer. Enjoys when watching football. But, feels should quit. Feels can quit. Diet better. Reports no fried foods or soda. Drinking juice instead.   Denies CP, SOB, DOE, treatment intolerance or new symptoms. Due for flu shot, labs (bmp, cbc, hgba1c, uric acid) AWV 03/03/18 Reports mood good. See phq9.  CAD, HTN, HLD: -sees cardiologist - Dr. Eldridge Dace -Referred to lipid clinic by his cardiologist - appreciate recs/care -s/p DES x2 in 2017 -meds: asa, norvasc, lipitor, plavix, fish oil, imdur, losartan-hctz, metoprolol  MorbidObesity/Hyperglycemia: -have provided him with extensive lifestyle recommendations and other options for wt reduction -he was able to loose some weight in 2018  Hx tobacco use, alcohol abuse, depression and anxiety: -quit smoking -cut back on alcohol -meds: paxil 20mg   GERD: -meds: protonix  Gout: -meds: allopurinol   ROS: See pertinent positives and negatives per HPI.  Past Medical History:  Diagnosis Date  . Alcohol abuse   . Angina pectoris (HCC) 12/09/2015  . Brain injury [facility])    report on disability for this, sees Dr. Arvilla Market  . Coronary artery disease    a. cath 12/11/15 99% stenosed mild LAD s/p overlapping drug-eluting stents; mid Cx 80% stenosed s/p Synergy drug-eluting stent; pro RCA 70% - medical managment for now, could consider cath  . Depression   . Elevated LFTs   . Erectile dysfunction   . Hyperlipemia   . Hypertension   . Mood disorder (HCC)     depression and GAD  . Obesity   . Rheumatoid arthritis involving right hip [facility])     Past Surgical History:  Procedure Laterality Date  . CARDIAC CATHETERIZATION N/A 12/11/2015   Procedure: Left Heart Cath and Coronary Angiography;  Surgeon: Corky Crafts, MD;  Location: [facility] INVASIVE CV LAB;  Service: Cardiovascular;  Laterality: N/A;  . CARDIAC CATHETERIZATION N/A 12/11/2015   Procedure: Coronary Stent Intervention;  Surgeon: Corky Crafts, MD;  Location: [facility] INVASIVE CV LAB;  Service: Cardiovascular;  Laterality: N/A;  . CORONARY ANGIOPLASTY    . FOREARM FRACTURE SURGERY Right ~ 1998   "I've got a metal rod in it"  . FRACTURE SURGERY      Family History  Problem Relation Age of Onset  . Hypertension Mother   . Hypertension Father   . Heart attack Maternal Grandmother   . Hypertension Maternal Grandmother   . Stroke Maternal Grandmother   . Heart attack Maternal Grandfather   . Hypertension Maternal Grandfather   . Hypertension Paternal Grandmother   . Hypertension Paternal Grandfather     SOCIAL HX: se ehpi   Current Outpatient Medications:  .  allopurinol (ZYLOPRIM) 100 MG tablet, Take 2 tabs daily, Disp: 60 tablet, Rfl: 0 .  amLODipine (NORVASC) 10 MG tablet, Take 1 tablet (10 mg total) by mouth daily., Disp: 90 tablet, Rfl: 3 .  aspirin EC 81 MG tablet, Take 1 tablet (81 mg total) by mouth daily., Disp: 90 tablet, Rfl: 3 .  clopidogrel (PLAVIX) 75 MG tablet, Take 1 tablet (75 mg total) by mouth daily with breakfast.,  Disp: 90 tablet, Rfl: 1 .  isosorbide mononitrate (IMDUR) 30 MG 24 hr tablet, Take 1 tablet (30 mg total) by mouth daily., Disp: 90 tablet, Rfl: 2 .  losartan-hydrochlorothiazide (HYZAAR) 100-25 MG tablet, TAKE 1 TABLET BY MOUTH DAILY., Disp: 90 tablet, Rfl: 0 .  Misc Natural Products (GINSENG COMPLEX PO), Take 1 tablet by mouth daily., Disp: , Rfl:  .  nitroGLYCERIN (NITROSTAT) 0.4 MG SL tablet, Place 1 tablet (0.4 mg total) under the tongue every 5  (five) minutes as needed for chest pain., Disp: 25 tablet, Rfl: 3 .  pantoprazole (PROTONIX) 40 MG tablet, TAKE 1 TABLET (40 MG TOTAL) BY MOUTH DAILY., Disp: 30 tablet, Rfl: 6 .  PARoxetine (PAXIL) 20 MG tablet, Take 1 tablet (20 mg total) by mouth daily., Disp: 90 tablet, Rfl: 1 .  atorvastatin (LIPITOR) 80 MG tablet, Take 1 tablet (80 mg total) by mouth daily., Disp: 90 tablet, Rfl: 3 .  metoprolol tartrate (LOPRESSOR) 50 MG tablet, Take 1 tablet (50 mg total) 2 (two) times daily by mouth., Disp: 180 tablet, Rfl: 3  EXAM:  Vitals:   09/20/18 1024  BP: 112/88  Pulse: 96  Temp: 98.9 F (37.2 C)    Body mass index is 41.82 kg/m.  GENERAL: vitals reviewed and listed above, alert, oriented, appears well hydrated and in no acute distress  HEENT: atraumatic, conjunttiva clear, no obvious abnormalities on inspection of external nose and ears  NECK: no obvious masses on inspection  LUNGS: clear to auscultation bilaterally, no wheezes, rales or rhonchi, good air movement  CV: HRRR, no peripheral edema  MS: moves all extremities without noticeable abnormality  PSYCH: pleasant and cooperative, no obvious depression or anxiety  ASSESSMENT AND PLAN:  Discussed the following assessment and plan:  Hyperglycemia - Plan: Hemoglobin A1c  Essential hypertension - Plan: Basic metabolic panel, CBC  Mixed hyperlipidemia  Morbid obesity (HCC)  Chronic gout without tophus, unspecified cause, unspecified site - Plan: Uric Acid  Recurrent major depressive disorder, in full remission (HCC)  -flu shot -labs -lengthy discussion about health, habits, motivation to change, options -he set some goals - would like to see him give up alcohol entirely, eat a healthy low sugar diet and get regular exercise -may increase allopurinol pending labs -f/u 3 months -Patient advised to return or notify a doctor immediately if symptoms worsen or persist or new concerns arise.  Patient Instructions   BEFORE YOU LEAVE: -flu shot -labs -follow up: 3 months  Goal this month: give up beer  Goal next month: fruit infused water  Goal the month after: lower sugar and processed foods in diet  We have ordered labs or studies at this visit. It can take up to 1-2 weeks for results and processing. IF results require follow up or explanation, we will call you with instructions. Clinically stable results will be released to your [facility]. If you have not heard from [place] or cannot find your results in [facility] in 2 weeks please contact our office at [phone].  If you are not yet signed up for [facility], please consider signing up.   We recommend the following healthy lifestyle for LIFE: 1) Small portions. But, make sure to get regular (at least 3 per day), healthy meals and small healthy snacks if needed.  2) Eat a healthy clean diet.   TRY TO EAT: -at least 5-7 servings of low sugar, colorful, and nutrient rich vegetables per day (not corn, potatoes or bananas.) -berries are the best choice if you  wish to eat fruit (only eat small amounts if trying to reduce weight)  -lean meets (fish, white meat of chicken or [place]) -vegan proteins for some meals - beans or tofu, whole grains, nuts and seeds -Replace bad fats with good fats - good fats include: fish, nuts and seeds, canola oil, olive oil -small amounts of low fat or non fat dairy -small amounts of100 % whole grains - check the lables -drink plenty of water  AVOID: -SUGAR, sweets, anything with added sugar, corn syrup or sweeteners - must read labels as even foods advertised as "healthy" often are loaded with sugar -if you must have a sweetener, small amounts of stevia may be best -sweetened beverages and artificially sweetened beverages -simple starches (rice, bread, potatoes, pasta, chips, etc - small amounts of 100% whole grains are ok) -red meat, pork, butter -fried foods, fast food, processed food, excessive dairy, eggs and  coconut.  3)Get at least 150 minutes of sweaty aerobic exercise per week.  4)Reduce stress - consider counseling, meditation and relaxation to balance other aspects of your life.         Terressa Koyanagi, DO

## 2018-09-20 ENCOUNTER — Ambulatory Visit (INDEPENDENT_AMBULATORY_CARE_PROVIDER_SITE_OTHER): Payer: Medicare HMO | Admitting: Family Medicine

## 2018-09-20 ENCOUNTER — Encounter: Payer: Self-pay | Admitting: Family Medicine

## 2018-09-20 VITALS — BP 112/88 | HR 96 | Temp 98.9°F | Ht 69.0 in | Wt 283.2 lb

## 2018-09-20 DIAGNOSIS — R739 Hyperglycemia, unspecified: Secondary | ICD-10-CM

## 2018-09-20 DIAGNOSIS — M1A9XX0 Chronic gout, unspecified, without tophus (tophi): Secondary | ICD-10-CM | POA: Diagnosis not present

## 2018-09-20 DIAGNOSIS — E782 Mixed hyperlipidemia: Secondary | ICD-10-CM | POA: Diagnosis not present

## 2018-09-20 DIAGNOSIS — I10 Essential (primary) hypertension: Secondary | ICD-10-CM | POA: Diagnosis not present

## 2018-09-20 DIAGNOSIS — F3342 Major depressive disorder, recurrent, in full remission: Secondary | ICD-10-CM

## 2018-09-20 DIAGNOSIS — Z23 Encounter for immunization: Secondary | ICD-10-CM | POA: Diagnosis not present

## 2018-09-20 LAB — BASIC METABOLIC PANEL
BUN: 11 mg/dL (ref 6–23)
CHLORIDE: 93 meq/L — AB (ref 96–112)
CO2: 34 mEq/L — ABNORMAL HIGH (ref 19–32)
CREATININE: 0.88 mg/dL (ref 0.40–1.50)
Calcium: 9.1 mg/dL (ref 8.4–10.5)
GFR: 119.71 mL/min (ref >60.00)
Glucose, Bld: 123 mg/dL — ABNORMAL HIGH (ref 70–99)
POTASSIUM: 3.2 meq/L — AB (ref 3.5–5.1)
Sodium: 137 mEq/L (ref 135–145)

## 2018-09-20 LAB — CBC
HEMATOCRIT: 43.4 % (ref 39.0–52.0)
Hemoglobin: 15.1 g/dL (ref 13.0–17.0)
MCHC: 34.8 g/dL (ref 30.0–36.0)
MCV: 96 fl (ref 78.0–100.0)
Platelets: 264 10*3/uL (ref 150.0–400.0)
RBC: 4.52 Mil/uL (ref 4.22–5.81)
RDW: 14.8 % (ref 11.5–15.5)
WBC: 9.8 10*3/uL (ref 4.0–10.5)

## 2018-09-20 LAB — URIC ACID: URIC ACID, SERUM: 8.2 mg/dL — AB (ref 4.0–7.8)

## 2018-09-20 LAB — HEMOGLOBIN A1C: HEMOGLOBIN A1C: 5.8 % (ref 4.6–6.5)

## 2018-09-20 NOTE — Patient Instructions (Signed)
BEFORE YOU LEAVE: -flu shot -labs -follow up: 3 months  Goal this month: give up beer  Goal next month: fruit infused water  Goal the month after: lower sugar and processed foods in diet  We have ordered labs or studies at this visit. It can take up to 1-2 weeks for results and processing. IF results require follow up or explanation, we will call you with instructions. Clinically stable results will be released to your [facility]. If you have not heard from [place] or cannot find your results in [facility] in 2 weeks please contact our office at [phone].  If you are not yet signed up for [facility], please consider signing up.   We recommend the following healthy lifestyle for LIFE: 1) Small portions. But, make sure to get regular (at least 3 per day), healthy meals and small healthy snacks if needed.  2) Eat a healthy clean diet.   TRY TO EAT: -at least 5-7 servings of low sugar, colorful, and nutrient rich vegetables per day (not corn, potatoes or bananas.) -berries are the best choice if you wish to eat fruit (only eat small amounts if trying to reduce weight)  -lean meets (fish, white meat of chicken or [place]) -vegan proteins for some meals - beans or tofu, whole grains, nuts and seeds -Replace bad fats with good fats - good fats include: fish, nuts and seeds, canola oil, olive oil -small amounts of low fat or non fat dairy -small amounts of100 % whole grains - check the lables -drink plenty of water  AVOID: -SUGAR, sweets, anything with added sugar, corn syrup or sweeteners - must read labels as even foods advertised as "healthy" often are loaded with sugar -if you must have a sweetener, small amounts of stevia may be best -sweetened beverages and artificially sweetened beverages -simple starches (rice, bread, potatoes, pasta, chips, etc - small amounts of 100% whole grains are ok) -red meat, pork, butter -fried foods, fast food, processed food, excessive dairy, eggs and  coconut.  3)Get at least 150 minutes of sweaty aerobic exercise per week.  4)Reduce stress - consider counseling, meditation and relaxation to balance other aspects of your life.

## 2018-09-20 NOTE — Addendum Note (Signed)
Addended by: Johnella Moloney on: 09/20/2018 11:18 AM   Modules accepted: Orders

## 2018-09-26 NOTE — Addendum Note (Signed)
Addended by: Johnella Moloney on: 09/26/2018 02:17 PM   Modules accepted: Orders

## 2018-10-13 ENCOUNTER — Encounter: Payer: Self-pay | Admitting: Family Medicine

## 2018-10-13 ENCOUNTER — Ambulatory Visit (INDEPENDENT_AMBULATORY_CARE_PROVIDER_SITE_OTHER): Payer: Medicare HMO | Admitting: Family Medicine

## 2018-10-13 VITALS — BP 132/80 | HR 100 | Temp 98.4°F | Ht 69.0 in | Wt 285.4 lb

## 2018-10-13 DIAGNOSIS — M7918 Myalgia, other site: Secondary | ICD-10-CM

## 2018-10-13 MED ORDER — TIZANIDINE HCL 2 MG PO TABS: 2.0000 mg | ORAL_TABLET | Freq: Every evening | ORAL | 0 refills | Status: DC | PRN

## 2018-10-13 NOTE — Progress Notes (Signed)
HPI:  Using dictation device. Unfortunately this device frequently misinterprets words/phrases.  Acute visit for buttock pain: -started 2 weeks ago -R buttock pain, mild-mod, intermittent -no fevers, malaise, dysuria -hx DDD, pars defect - last MRI 2016  ROS: See pertinent positives and negatives per HPI.  Past Medical History:  Diagnosis Date  . Alcohol abuse   . Angina pectoris (HCC) 12/09/2015  . Brain injury [facility](HCC)    report on disability for this, sees Dr. Arvilla MarketShwartz  . Coronary artery disease    a. cath 12/11/15 99% stenosed mild LAD s/p overlapping drug-eluting stents; mid Cx 80% stenosed s/p Synergy drug-eluting stent; pro RCA 70% - medical managment for now, could consider cath  . Depression   . Elevated LFTs   . Erectile dysfunction   . Hyperlipemia   . Hypertension   . Mood disorder (HCC)    depression and GAD  . Obesity   . Rheumatoid arthritis involving right hip [facility](HCC)     Past Surgical History:  Procedure Laterality Date  . CARDIAC CATHETERIZATION N/A 12/11/2015   Procedure: Left Heart Cath and Coronary Angiography;  Surgeon: Corky CraftsJayadeep S Varanasi, MD;  [facility];  Service: Cardiovascular;  Laterality: N/A;  . CARDIAC CATHETERIZATION N/A 12/11/2015   Procedure: Coronary Stent Intervention;  Surgeon: Corky CraftsJayadeep S Varanasi, MD;  [facility];  Service: Cardiovascular;  Laterality: N/A;  . CORONARY ANGIOPLASTY    . FOREARM FRACTURE SURGERY Right ~ 1998   "I've got a metal rod in it"  . FRACTURE SURGERY      Family History  Problem Relation Age of Onset  . Hypertension Mother   . Hypertension Father   . Heart attack Maternal Grandmother   . Hypertension Maternal Grandmother   . Stroke Maternal Grandmother   . Heart attack Maternal Grandfather   . Hypertension Maternal Grandfather   . Hypertension Paternal Grandmother   . Hypertension Paternal Grandfather     SOCIAL HX: see hpi   Current Outpatient Medications:  .   allopurinol (ZYLOPRIM) 100 MG tablet, Take 2 tabs daily, Disp: 60 tablet, Rfl: 0 .  amLODipine (NORVASC) 10 MG tablet, Take 1 tablet (10 mg total) by mouth daily., Disp: 90 tablet, Rfl: 3 .  aspirin EC 81 MG tablet, Take 1 tablet (81 mg total) by mouth daily., Disp: 90 tablet, Rfl: 3 .  clopidogrel (PLAVIX) 75 MG tablet, Take 1 tablet (75 mg total) by mouth daily with breakfast., Disp: 90 tablet, Rfl: 1 .  isosorbide mononitrate (IMDUR) 30 MG 24 hr tablet, Take 1 tablet (30 mg total) by mouth daily., Disp: 90 tablet, Rfl: 2 .  losartan-hydrochlorothiazide (HYZAAR) 100-25 MG tablet, TAKE 1 TABLET BY MOUTH DAILY., Disp: 90 tablet, Rfl: 0 .  Misc Natural Products (GINSENG COMPLEX PO), Take 1 tablet by mouth daily., Disp: , Rfl:  .  nitroGLYCERIN (NITROSTAT) 0.4 MG SL tablet, Place 1 tablet (0.4 mg total) under the tongue every 5 (five) minutes as needed for chest pain., Disp: 25 tablet, Rfl: 3 .  pantoprazole (PROTONIX) 40 MG tablet, TAKE 1 TABLET (40 MG TOTAL) BY MOUTH DAILY., Disp: 30 tablet, Rfl: 6 .  PARoxetine (PAXIL) 20 MG tablet, Take 1 tablet (20 mg total) by mouth daily., Disp: 90 tablet, Rfl: 1 .  atorvastatin (LIPITOR) 80 MG tablet, Take 1 tablet (80 mg total) by mouth daily., Disp: 90 tablet, Rfl: 3 .  metoprolol tartrate (LOPRESSOR) 50 MG tablet, Take 1 tablet (50 mg total) 2 (two) times daily by  mouth., Disp: 180 tablet, Rfl: 3 .  tiZANidine (ZANAFLEX) 2 MG tablet, Take 1 tablet (2 mg total) by mouth at bedtime as needed for muscle spasms., Disp: 20 tablet, Rfl: 0  EXAM:  Vitals:   10/13/18 1319  BP: 132/80  Pulse: 100  Temp: 98.4 F (36.9 C)    Body mass index is 42.15 kg/m.  GENERAL: vitals reviewed and listed above, alert, oriented, appears well hydrated and in no acute distress  HEENT: atraumatic, conjunttiva clear, no obvious abnormalities on inspection of external nose and ears  NECK: no obvious masses on inspection  LUNGS: clear to auscultation bilaterally, no  wheezes, rales or rhonchi, good air movement  CV: HRRR, no peripheral edema  MS: moves all extremities without noticeable abnormality, TTP focal R upper glutes, no bony TTP, no SI TTP, no sig lumbar TTP, neg SLRT, Neg CLRT, neg FABER/FADIR, gait normal  PSYCH: pleasant and cooperative, no obvious depression or anxiety  ASSESSMENT AND PLAN:  Discussed the following assessment and plan:  Buttock pain  -we discussed possible serious and likely etiologies, workup and treatment, treatment risks and return precautions, reviewed MRI -after this discussion, Mikolaj opted for HEP, symptomatic care, close follow up, ortho or sports med referral if worsening or not improving -of course, we advised Long  to return or notify a doctor immediately if symptoms worsen or persist or new concerns arise.   Patient Instructions  BEFORE YOU LEAVE: -home exercises -follow up: 3-4 weeks  Heat 15 minutes twice daily - rice sock.  Home exercises 4 days per week.  Tylenol 500-1000mg  up to twice daily AS NEEDED for pain.  I hope you are feeling better soon! Seek care promptly if your symptoms worsen, new concerns arise or you are not improving with treatment. If worsening will have you see a back specialist - orthopedic specialist.     Terressa Koyanagi, DO

## 2018-10-13 NOTE — Patient Instructions (Addendum)
BEFORE YOU LEAVE: -home exercises -follow up: 3-4 weeks  Heat 15 minutes twice daily - rice sock.  Home exercises 4 days per week.  Tylenol 500-1000mg  up to twice daily AS NEEDED for pain.  I hope you are feeling better soon! Seek care promptly if your symptoms worsen, new concerns arise or you are not improving with treatment. If worsening will have you see a back specialist - orthopedic specialist.

## 2018-12-22 ENCOUNTER — Ambulatory Visit: Payer: Self-pay | Admitting: Family Medicine

## 2018-12-26 NOTE — Progress Notes (Signed)
HPI:  Using dictation device. Unfortunately this device frequently misinterprets words/phrases.  Logan Sanchez is a pleasant 48 y.o. here for follow up. Chronic medical problems summarized below were reviewed for changes and stability and were updated as needed below. These issues and their treatment remain stable for the most part.  Length discussion about the importance of a healthy diet and avoidance of alcohol at his last visit. Reports cutting back on sodas and sweets. Unfortunately, drinking 1-2 bud lights per night. Also, not taking all medications every day. Skipped his metoprolol and norvasc today. Reports out of his allopurinol. He picks and chooses which medications to take - reports always takes the plavix. Takes his paxil most days and doesn't feel needs further help with mood. Reports stable. Denies CP, SOB, DOE, treatment intolerance or new symptoms.  AWV in April, phq9  AWV 03/03/18 Reports mood good. See phq9.  CAD, HTN, HLD: -sees cardiologist - Dr. Eldridge DaceVaranasi -Referred to lipid clinic by his cardiologist - appreciate recs/care -s/p DES x2 in 2017 -meds: asa, norvasc, lipitor, plavix, fish oil, imdur, losartan-hctz, metoprolol  MorbidObesity/Hyperglycemia: -have provided him with extensive lifestyle recommendations and other options for wt reduction -he was able to loose some weight in 2018  Hx tobacco use, alcohol abuse, depression and anxiety: -quit smoking -cut back on alcohol -meds: paxil 20mg   GERD: -meds: protonix  Gout: -meds: allopurinol   ROS: See pertinent positives and negatives per HPI.  Past Medical History:  Diagnosis Date  . Alcohol abuse   . Angina pectoris (HCC) 12/09/2015  . Brain injury [facility](HCC)    report on disability for this, sees Dr. Arvilla MarketShwartz  . Coronary artery disease    a. cath 12/11/15 99% stenosed mild LAD s/p overlapping drug-eluting stents; mid Cx 80% stenosed s/p Synergy drug-eluting stent; pro RCA 70% - medical managment for  now, could consider cath  . Depression   . Elevated LFTs   . Erectile dysfunction   . Hyperlipemia   . Hypertension   . Mood disorder (HCC)    depression and GAD  . Obesity   . Rheumatoid arthritis involving right hip [facility](HCC)     Past Surgical History:  Procedure Laterality Date  . CARDIAC CATHETERIZATION N/A 12/11/2015   Procedure: Left Heart Cath and Coronary Angiography;  Surgeon: Corky CraftsJayadeep S Varanasi, MD;  [facility];  Service: Cardiovascular;  Laterality: N/A;  . CARDIAC CATHETERIZATION N/A 12/11/2015   Procedure: Coronary Stent Intervention;  Surgeon: Corky CraftsJayadeep S Varanasi, MD;  [facility];  Service: Cardiovascular;  Laterality: N/A;  . CORONARY ANGIOPLASTY    . FOREARM FRACTURE SURGERY Right ~ 1998   "I've got a metal rod in it"  . FRACTURE SURGERY      Family History  Problem Relation Age of Onset  . Hypertension Mother   . Hypertension Father   . Heart attack Maternal Grandmother   . Hypertension Maternal Grandmother   . Stroke Maternal Grandmother   . Heart attack Maternal Grandfather   . Hypertension Maternal Grandfather   . Hypertension Paternal Grandmother   . Hypertension Paternal Grandfather     SOCIAL HX: see hpi, continues to drink alcohol   Current Outpatient Medications:  .  allopurinol (ZYLOPRIM) 100 MG tablet, Take 2 tabs daily, Disp: 60 tablet, Rfl: 0 .  amLODipine (NORVASC) 10 MG tablet, Take 1 tablet (10 mg total) by mouth daily., Disp: 90 tablet, Rfl: 3 .  aspirin EC 81 MG tablet, Take 1 tablet (81 mg total)  by mouth daily., Disp: 90 tablet, Rfl: 3 .  clopidogrel (PLAVIX) 75 MG tablet, Take 1 tablet (75 mg total) by mouth daily with breakfast., Disp: 90 tablet, Rfl: 1 .  isosorbide mononitrate (IMDUR) 30 MG 24 hr tablet, Take 1 tablet (30 mg total) by mouth daily., Disp: 90 tablet, Rfl: 2 .  losartan-hydrochlorothiazide (HYZAAR) 100-25 MG tablet, TAKE 1 TABLET BY MOUTH DAILY., Disp: 90 tablet, Rfl: 0 .  Misc Natural  Products (GINSENG COMPLEX PO), Take 1 tablet by mouth daily., Disp: , Rfl:  .  nitroGLYCERIN (NITROSTAT) 0.4 MG SL tablet, Place 1 tablet (0.4 mg total) under the tongue every 5 (five) minutes as needed for chest pain., Disp: 25 tablet, Rfl: 3 .  pantoprazole (PROTONIX) 40 MG tablet, TAKE 1 TABLET (40 MG TOTAL) BY MOUTH DAILY., Disp: 30 tablet, Rfl: 6 .  PARoxetine (PAXIL) 20 MG tablet, Take 1 tablet (20 mg total) by mouth daily., Disp: 90 tablet, Rfl: 1 .  tiZANidine (ZANAFLEX) 2 MG tablet, Take 1 tablet (2 mg total) by mouth at bedtime as needed for muscle spasms., Disp: 20 tablet, Rfl: 0 .  atorvastatin (LIPITOR) 80 MG tablet, Take 1 tablet (80 mg total) by mouth daily., Disp: 90 tablet, Rfl: 3 .  metoprolol tartrate (LOPRESSOR) 50 MG tablet, Take 1 tablet (50 mg total) 2 (two) times daily by mouth., Disp: 180 tablet, Rfl: 3  EXAM:  Vitals:   12/27/18 1117  BP: 102/82  Pulse: (!) 113  Temp: 98.3 F (36.8 C)  SpO2: 98%    Body mass index is 40.83 kg/m.  GENERAL: vitals reviewed and listed above, alert, oriented, appears well hydrated and in no acute distress  HEENT: atraumatic, conjunttiva clear, no obvious abnormalities on inspection of external nose and ears  NECK: no obvious masses on inspection  LUNGS: clear to auscultation bilaterally, no wheezes, rales or rhonchi, good air movement  CV: HRRR, no peripheral edema  MS: moves all extremities without noticeable abnormality  PSYCH: pleasant and cooperative, no obvious depression or anxiety  ASSESSMENT AND PLAN:  Discussed the following assessment and plan:  Morbid obesity (HCC)  Mixed hyperlipidemia  Coronary artery disease involving native coronary artery of native heart with angina pectoris (HCC)  Hyperglycemia  Essential hypertension  Recurrent major depressive disorder, in full remission (HCC)  Alcohol abuse  -lifestyle recs discussed at length -discussed implications of his multiple chronic conditions,  importance of medication compliance and a healthier lifestyle -assessed for barriers -advised pillbox, healthy diet, regular exercise, alcohol cessation and help if needed with alcohol rehab or psychiatry -went over all meds again and advised to take every day -advised regular cardiology follow up -follow up with fasting labs and AWV in april   Patient Instructions  BEFORE YOU LEAVE: -follow up: AWV and follow up in April with fasting labs then.  Call your cardiologist for follow up.  Take ALL of your medications EVERY day. Use the pill box.  Stop drinking alcohol. Get help if you have difficulty.  Eat a healthy low sugar diet and get regular exercise.    Terressa KoyanagiHannah R Derrious Bologna, DO

## 2018-12-27 ENCOUNTER — Ambulatory Visit (INDEPENDENT_AMBULATORY_CARE_PROVIDER_SITE_OTHER): Payer: Medicare HMO | Admitting: Family Medicine

## 2018-12-27 ENCOUNTER — Encounter: Payer: Self-pay | Admitting: Family Medicine

## 2018-12-27 DIAGNOSIS — R739 Hyperglycemia, unspecified: Secondary | ICD-10-CM | POA: Diagnosis not present

## 2018-12-27 DIAGNOSIS — F3342 Major depressive disorder, recurrent, in full remission: Secondary | ICD-10-CM

## 2018-12-27 DIAGNOSIS — F1010 Alcohol abuse, uncomplicated: Secondary | ICD-10-CM | POA: Diagnosis not present

## 2018-12-27 DIAGNOSIS — E782 Mixed hyperlipidemia: Secondary | ICD-10-CM

## 2018-12-27 DIAGNOSIS — I25119 Atherosclerotic heart disease of native coronary artery with unspecified angina pectoris: Secondary | ICD-10-CM | POA: Diagnosis not present

## 2018-12-27 DIAGNOSIS — I10 Essential (primary) hypertension: Secondary | ICD-10-CM

## 2018-12-27 NOTE — Patient Instructions (Signed)
BEFORE YOU LEAVE: -follow up: AWV and follow up in April with fasting labs then.  Call your cardiologist for follow up.  Take ALL of your medications EVERY day. Use the pill box.  Stop drinking alcohol. Get help if you have difficulty.  Eat a healthy low sugar diet and get regular exercise.

## 2018-12-28 ENCOUNTER — Telehealth: Payer: Self-pay | Admitting: Interventional Cardiology

## 2018-12-28 NOTE — Telephone Encounter (Signed)
New message     Pt stated that he feels tingling in left side of chest . Pt stated  that it doesn't hurt.He stated that it will tingle for about 2 weeks at a time and it didn't start until this month. Pt not experiencing any other symptoms but feeling sleepy and  cant sleep.

## 2018-12-28 NOTE — Telephone Encounter (Signed)
Left message for patient to call back  

## 2019-01-02 NOTE — Telephone Encounter (Signed)
Left message for patient to call back  

## 2019-01-04 NOTE — Telephone Encounter (Signed)
Left message for patient to call back if he has any needs or concerns that need to be addressed.

## 2019-01-09 NOTE — Progress Notes (Signed)
Cardiology Office Note    Date:  01/10/2019   ID:  Logan Sanchez, DOB Mar 14, 1971, MRN [id number]  PCP:  Terressa Koyanagi, DO  Cardiologist: Lance Muss, MD EPS: None  Chief Complaint  Patient presents with  . Chest Pain    History of Present Illness:  Logan Sanchez is a 48 y.o. male with history of CAD status post overlapping DES to the mid LAD, DES to the mid circumflex 12/2015. recommend indefinite Plavix given diffuse CAD.  We considered bringing him back for further evaluation of the proximal RCA lesion which appeared to have some spontaneous dissection.  He had no angina so was treated medically.  Also history of EtOH.  Last saw Dr. Eldridge Dace 03/2018 at which time he was having some dizziness.  2D echo showed normal LVEF EF 55 to 60%, no wall motion abnormality, mild LVH and grade 1 DD.  Patient added on to my schedule because of complaints of left chest tingling for 2 weeks. Takes his meds in am. Around lunch he has chest tightness that is off/on for 1/2 day. Feels like when muscles get tight after lifting weights.Says it's similar to when he had his stents. 1 month ago woke up in the middle of the night with heart skipping every 3 beats.  Symptoms are not exertional.  He forgets to take meds a lot of days and it may be worse on those days.  He never takes metoprolol in the afternoon.  Quit smoking 3 yrs ago. Drinks Beer 2- 16 ounces every couple of days. Takes alka selter everyday for reflux and belching.  Past Medical History:  Diagnosis Date  . Alcohol abuse   . Angina pectoris (HCC) 12/09/2015  . Brain injury [facility])    report on disability for this, sees Dr. Arvilla Market  . Coronary artery disease    a. cath 12/11/15 99% stenosed mild LAD s/p overlapping drug-eluting stents; mid Cx 80% stenosed s/p Synergy drug-eluting stent; pro RCA 70% - medical managment for now, could consider cath  . Depression   . Elevated LFTs   . Erectile dysfunction   . Hyperlipemia   . Hypertension     . Mood disorder (HCC)    depression and GAD  . Obesity   . Rheumatoid arthritis involving right hip [facility])     Past Surgical History:  Procedure Laterality Date  . CARDIAC CATHETERIZATION N/A 12/11/2015   Procedure: Left Heart Cath and Coronary Angiography;  Surgeon: Corky Crafts, MD;  Location: [facility] INVASIVE CV LAB;  Service: Cardiovascular;  Laterality: N/A;  . CARDIAC CATHETERIZATION N/A 12/11/2015   Procedure: Coronary Stent Intervention;  Surgeon: Corky Crafts, MD;  Location: [facility] INVASIVE CV LAB;  Service: Cardiovascular;  Laterality: N/A;  . CORONARY ANGIOPLASTY    . FOREARM FRACTURE SURGERY Right ~ 1998   "I've got a metal rod in it"  . FRACTURE SURGERY      Current Medications: No outpatient medications have been marked as taking for the 01/10/19 encounter (Office Visit) with Dyann Kief, PA-C.     Allergies:   Patient has no known allergies.   Social History   Socioeconomic History  . Marital status: Single    Spouse name: Not on file  . Number of children: Not on file  . Years of education: Not on file  . Highest education level: Not on file  Occupational History  . Not on file  Social Needs  . Financial resource strain: Not on file  .  Food insecurity:    Worry: Not on file    Inability: Not on file  . Transportation needs:    Medical: Not on file    Non-medical: Not on file  Tobacco Use  . Smoking status: Former Smoker    Packs/day: 1.50    Years: 25.00    Pack years: 37.50    Types: Cigarettes    Last attempt to quit: 09/30/2014    Years since quitting: 4.2  . Smokeless tobacco: Never Used  Substance and Sexual Activity  . Alcohol use: Yes    Alcohol/week: 76.0 standard drinks    Types: 23 Cans of beer, 53 Shots of liquor per week    Comment: 03/08/2017 drinks small beers rarely; no return ot heavy drinking   . Drug use: No  . Sexual activity: Not Currently  Lifestyle  . Physical activity:    Days per week: Not on file    Minutes per  session: Not on file  . Stress: Not on file  Relationships  . Social connections:    Talks on phone: Not on file    Gets together: Not on file    Attends religious service: Not on file    Active member of club or organization: Not on file    Attends meetings of clubs or organizations: Not on file    Relationship status: Not on file  Other Topics Concern  . Not on file  Social History Narrative   Work or School: on disability for brain injury      Home Situation:       Spiritual Beliefs:      Lifestyle: no regular exercise, poor diet        Family History:  The patient's   family history includes Heart attack in his maternal grandfather and maternal grandmother; Hypertension in his father, maternal grandfather, maternal grandmother, mother, paternal grandfather, and paternal grandmother; Stroke in his maternal grandmother.   ROS:   Please see the history of present illness.    Review of Systems  Constitution: Positive for weight loss.  HENT: Negative.   Cardiovascular: Positive for chest pain and palpitations.  Respiratory: Negative.   Endocrine: Negative.   Hematologic/Lymphatic: Negative.   Musculoskeletal: Negative.   Gastrointestinal: Positive for heartburn.  Genitourinary: Negative.   Neurological: Negative.    All other systems reviewed and are negative.   PHYSICAL EXAM:   VS:  BP 136/78 (BP Location: Left Arm, Patient Position: Sitting, Cuff Size: Large)   Pulse 84   Ht 5\' 6"  (1.676 m)   Wt 285 lb (129.3 kg)   BMI 46.00 kg/m   Physical Exam  GEN: Obese, in no acute distress  Neck: no JVD, carotid bruits, or masses Cardiac:RRR; no murmurs, rubs, or gallops  Respiratory:  clear to auscultation bilaterally, normal work of breathing GI: soft, nontender, nondistended, + BS Ext: without cyanosis, clubbing, or edema, Good distal pulses bilaterally Neuro:  Alert and Oriented x 3 Psych: euthymic mood, full affect  Wt Readings from Last 3 Encounters:  01/10/19  285 lb (129.3 kg)  12/27/18 276 lb 8 oz (125.4 kg)  10/13/18 285 lb 6.4 oz (129.5 kg)      Studies/Labs Reviewed:   EKG:  EKG is  ordered today.  The ekg ordered today demonstrates normal sinus rhythm, normal EKG  Recent Labs: 01/10/2018: ALT 38 09/20/2018: BUN 11; Creatinine, Ser 0.88; Hemoglobin 15.1; Platelets 264.0; Potassium 3.2; Sodium 137   Lipid Panel    Component Value Date/Time  CHOL 236 (H) 06/03/2018 0844   TRIG 365 (H) 06/03/2018 0844   HDL 40 06/03/2018 0844   CHOLHDL 5.9 (H) 06/03/2018 0844   CHOLHDL 6 10/01/2017 1055   VLDL 48.6 (H) 10/01/2017 1055   LDLCALC 123 (H) 06/03/2018 0844   LDLDIRECT 142 (H) 06/03/2018 0844   LDLDIRECT 177.0 10/01/2017 1055    Additional studies/ records that were reviewed today include:  2D echo 5/28/2019Study Conclusions   - Left ventricle: The cavity size was normal. Wall thickness was   increased in a pattern of mild LVH. Systolic function was normal.   The estimated ejection fraction was in the range of 55% to 60%.   Wall motion was normal; there were no regional wall motion   abnormalities. Doppler parameters are consistent with abnormal   left ventricular relaxation (grade 1 diastolic dysfunction).   Impressions:   - Technically difficult; normal LV function; mild diastolic   dysfunction; mild LVH.   Cardiac catheterization 1/11/2017Prox RCA lesion, 70% stenosed. Small area of dissection, with normal flow.  Mid LAD lesion, 99% stenosed. Post intervention with overlapping drug-eluting stents, there is a 0% residual stenosis.  The left ventricular systolic function is normal.  Mid Cx lesion, 80% stenosed. Post intervention with a 3.0 x 24 Synergy drug-eluting stent, there is a 0% residual stenosis.   Continue dual antiplatelet therapy for at least a year and perhaps beyond given his diffuse coronary disease. I suspect his angina will be improved. We'll consider bringing him back for further evaluation of the proximal  right coronary lesion which appears to have some spontaneous dissection. Watch the patient overnight. Anticipate discharge tomorrow.   He'll need aggressive risk factor modification including weight loss, lipid-lowering therapy and reduction of his alcohol consumption. I spoke at length with the patient's father and he was most concerned about the alcohol consumption. We'll discuss this with the patient.      ASSESSMENT:    1. Angina pectoris (HCC)   2. Coronary artery disease involving native coronary artery of native heart without angina pectoris   3. Essential hypertension   4. Mixed hyperlipidemia   5. Morbid obesity (HCC)      PLAN:  In order of problems listed above:  Chest pain described as tightness and tingling similar to his prior angina but mostly occurs at rest and is nonexertional.  He misses his medications on a lot of days and it might be worse on those days.  He also has a lot of GI symptoms and has run out of his Protonix.  He never takes his metoprolol in the afternoon because he forgets.  EKG is unchanged.  Will change metoprolol to Toprol-XL 100 mg once daily, increase Imdur to 60 mg once daily, refill Protonix, order exercise Myoview to rule out ischemia.  Long discussion with the patient and concerning taking his medicines regularly.  We will see if this improves his symptoms.  Follow-up with Dr. Eldridge DaceVaranasi.  CAD status post overlapping DES to the mid LAD and DES to the mid circumflex 2017 with proximal RCA lesion appeared to have some spontaneous dissection treated medically.  2D echo 12/2017 normal LVEF 55 to 60%.  See above now with recurrent chest pain  Essential hypertension blood pressure stable today.  Mixed hyperlipidemia patient did not follow-up with the lipid clinic.  LDL 123 triglycerides 365 and total cholesterol 236 05/2018 we will repeat fasting lipid panel and surveillance labs when he comes back for stress test.  Morbid obesity patient has lost  50 pounds  by quitting fried foods. Ongoing exercise and weight loss recommended.  Medication Adjustments/Labs and Tests Ordered: Current medicines are reviewed at length with the patient today.  Concerns regarding medicines are outlined above.  Medication changes, Labs and Tests ordered today are listed in the Patient Instructions below. Patient Instructions  Medication Instructions:  Your physician has recommended you make the following change in your medication:   1. STOP: metoprolol tartrate  2. START: metoprolol succinate 100 mg tablet: Take 1 tablet by mouth once a day  3. INCREASE: isosorbide mononitrate (imdur) to 60 mg total once a day  4. A refill was sent in for your pantoprazole (protonoix) 40 mg tablet   Lab work: Your physician recommends that you return for a FASTING lipid profile, complete metabolic panel, and complete blood count   If you have labs (blood work) drawn today and your tests are completely normal, you will receive your results only by: Marland Kitchen MyChart Message (if you have MyChart) OR . A paper copy in the mail If you have any lab test that is abnormal or we need to change your treatment, we will call you to review the results.  Testing/Procedures: Your physician has requested that you have en exercise stress myoview. For further information please visit [email or web address] Please follow instruction sheet, as given.   Follow-Up: . Keep appointment with Dr. Eldridge Dace on 03/03/19 at 10:00 AM  Any Other Special Instructions Will Be Listed Below (If Applicable).       Elson Clan, PA-C  01/10/2019 9:59 AM    [facility] [address] [place], [place], [place]  [address] Phone: [phone]; Fax: [phone]

## 2019-01-10 ENCOUNTER — Ambulatory Visit (INDEPENDENT_AMBULATORY_CARE_PROVIDER_SITE_OTHER): Payer: Medicare HMO | Admitting: Physician Assistant

## 2019-01-10 ENCOUNTER — Encounter: Payer: Self-pay | Admitting: Physician Assistant

## 2019-01-10 VITALS — BP 136/78 | HR 84 | Ht 66.0 in | Wt 285.0 lb

## 2019-01-10 DIAGNOSIS — I10 Essential (primary) hypertension: Secondary | ICD-10-CM

## 2019-01-10 DIAGNOSIS — E782 Mixed hyperlipidemia: Secondary | ICD-10-CM | POA: Diagnosis not present

## 2019-01-10 DIAGNOSIS — I2510 Atherosclerotic heart disease of native coronary artery without angina pectoris: Secondary | ICD-10-CM | POA: Diagnosis not present

## 2019-01-10 DIAGNOSIS — Z6841 Body Mass Index (BMI) 40.0 and over, adult: Secondary | ICD-10-CM | POA: Diagnosis not present

## 2019-01-10 DIAGNOSIS — I209 Angina pectoris, unspecified: Secondary | ICD-10-CM | POA: Diagnosis not present

## 2019-01-10 MED ORDER — METOPROLOL SUCCINATE ER 100 MG PO TB24: 100.0000 mg | ORAL_TABLET | Freq: Every day | ORAL | 3 refills | Status: AC

## 2019-01-10 MED ORDER — PANTOPRAZOLE SODIUM 40 MG PO TBEC: 40.0000 mg | DELAYED_RELEASE_TABLET | Freq: Every day | ORAL | 3 refills | Status: AC

## 2019-01-10 MED ORDER — ISOSORBIDE MONONITRATE ER 60 MG PO TB24: 60.0000 mg | ORAL_TABLET | Freq: Every day | ORAL | 3 refills | Status: AC

## 2019-01-10 NOTE — Patient Instructions (Signed)
Medication Instructions:  Your physician has recommended you make the following change in your medication:   1. STOP: metoprolol tartrate  2. START: metoprolol succinate 100 mg tablet: Take 1 tablet by mouth once a day  3. INCREASE: isosorbide mononitrate (imdur) to 60 mg total once a day  4. A refill was sent in for your pantoprazole (protonoix) 40 mg tablet   Lab work: Your physician recommends that you return for a FASTING lipid profile, complete metabolic panel, and complete blood count   If you have labs (blood work) drawn today and your tests are completely normal, you will receive your results only by: Marland Kitchen MyChart Message (if you have MyChart) OR . A paper copy in the mail If you have any lab test that is abnormal or we need to change your treatment, we will call you to review the results.  Testing/Procedures: Your physician has requested that you have en exercise stress myoview. For further information please visit [email or web address] Please follow instruction sheet, as given.   Follow-Up: . Keep appointment with Dr. Eldridge Dace on 03/03/19 at 10:00 AM  Any Other Special Instructions Will Be Listed Below (If Applicable).

## 2019-01-27 ENCOUNTER — Other Ambulatory Visit: Payer: Self-pay | Admitting: Family Medicine

## 2019-02-01 ENCOUNTER — Telehealth (HOSPITAL_COMMUNITY): Payer: Self-pay | Admitting: *Deleted

## 2019-02-01 NOTE — Telephone Encounter (Signed)
Left message on voicemail per DPR in reference to upcoming appointment scheduled on 02/06/19 with detailed instructions given per Myocardial Perfusion Study Information Sheet for the test. LM to arrive 15 minutes early, and that it is imperative to arrive on time for appointment to keep from having the test rescheduled. If you need to cancel or reschedule your appointment, please call the office within 24 hours of your appointment. Failure to do so may result in a cancellation of your appointment, and a $50 no show fee. Phone number given for call back for any questions. Jhony Antrim Jacqueline    

## 2019-02-06 ENCOUNTER — Ambulatory Visit (HOSPITAL_COMMUNITY): Payer: Medicare HMO | Attending: Cardiovascular Disease

## 2019-02-06 ENCOUNTER — Other Ambulatory Visit: Payer: Medicare HMO | Admitting: *Deleted

## 2019-02-06 DIAGNOSIS — I2510 Atherosclerotic heart disease of native coronary artery without angina pectoris: Secondary | ICD-10-CM

## 2019-02-06 DIAGNOSIS — I209 Angina pectoris, unspecified: Secondary | ICD-10-CM

## 2019-02-06 DIAGNOSIS — E782 Mixed hyperlipidemia: Secondary | ICD-10-CM

## 2019-02-06 DIAGNOSIS — I10 Essential (primary) hypertension: Secondary | ICD-10-CM

## 2019-02-06 LAB — COMPREHENSIVE METABOLIC PANEL
A/G RATIO: 1.6 (ref 1.2–2.2)
ALT: 46 IU/L — ABNORMAL HIGH (ref 0–44)
AST: 112 IU/L — ABNORMAL HIGH (ref 0–40)
Albumin: 4.2 g/dL (ref 4.0–5.0)
Alkaline Phosphatase: 100 IU/L (ref 39–117)
BUN/Creatinine Ratio: 9 (ref 9–20)
BUN: 9 mg/dL (ref 6–24)
Bilirubin Total: 0.6 mg/dL (ref 0.0–1.2)
CO2: 24 mmol/L (ref 20–29)
Calcium: 9.2 mg/dL (ref 8.7–10.2)
Chloride: 97 mmol/L (ref 96–106)
Creatinine, Ser: 0.97 mg/dL (ref 0.76–1.27)
GFR calc non Af Amer: 93 mL/min/{1.73_m2} (ref >59)
GFR, EST AFRICAN AMERICAN: 108 mL/min/{1.73_m2} (ref >59)
Globulin, Total: 2.6 g/dL (ref 1.5–4.5)
Glucose: 111 mg/dL — ABNORMAL HIGH (ref 65–99)
Potassium: 3.5 mmol/L (ref 3.5–5.2)
Sodium: 140 mmol/L (ref 134–144)
TOTAL PROTEIN: 6.8 g/dL (ref 6.0–8.5)

## 2019-02-06 LAB — LIPID PANEL
Chol/HDL Ratio: 4.3 ratio (ref 0.0–5.0)
Cholesterol, Total: 186 mg/dL (ref 100–199)
HDL: 43 mg/dL (ref >39)
Triglycerides: 482 mg/dL — ABNORMAL HIGH (ref 0–149)

## 2019-02-06 LAB — CBC
Hematocrit: 41.1 % (ref 37.5–51.0)
Hemoglobin: 14.2 g/dL (ref 13.0–17.7)
MCH: 32.4 pg (ref 26.6–33.0)
MCHC: 34.5 g/dL (ref 31.5–35.7)
MCV: 94 fL (ref 79–97)
Platelets: 258 10*3/uL (ref 150–450)
RBC: 4.38 x10E6/uL (ref 4.14–5.80)
RDW: 14.5 % (ref 11.6–15.4)
WBC: 6.5 10*3/uL (ref 3.4–10.8)

## 2019-02-06 MED ORDER — TECHNETIUM TC 99M TETROFOSMIN IV KIT
31.1000 | PACK | Freq: Once | INTRAVENOUS | Status: AC | PRN
  Administered 2019-02-06: 31.1 via INTRAVENOUS
  Filled 2019-02-06: qty 32

## 2019-02-07 ENCOUNTER — Ambulatory Visit (HOSPITAL_COMMUNITY): Payer: Medicare HMO | Attending: Cardiovascular Disease

## 2019-02-07 ENCOUNTER — Other Ambulatory Visit: Payer: Self-pay

## 2019-02-07 LAB — MYOCARDIAL PERFUSION IMAGING
CHL CUP NUCLEAR SDS: 0
Estimated workload: 4.6 METS
Exercise duration (min): 4 min
Exercise duration (sec): 0 s
LV dias vol: 85 mL (ref 62–150)
LV sys vol: 39 mL
MPHR: 174 {beats}/min
Peak HR: 176 {beats}/min
Percent HR: 101 %
Rest HR: 109 {beats}/min
SRS: 0
SSS: 0
TID: 1.15

## 2019-02-07 MED ORDER — TECHNETIUM TC 99M TETROFOSMIN IV KIT
29.5000 | PACK | Freq: Once | INTRAVENOUS | Status: AC | PRN
  Administered 2019-02-07: 29.5 via INTRAVENOUS
  Filled 2019-02-07: qty 30

## 2019-02-09 ENCOUNTER — Other Ambulatory Visit: Payer: Self-pay

## 2019-02-09 DIAGNOSIS — E782 Mixed hyperlipidemia: Secondary | ICD-10-CM

## 2019-02-09 MED ORDER — ATORVASTATIN CALCIUM 80 MG PO TABS: 40.0000 mg | ORAL_TABLET | Freq: Every day | ORAL | 3 refills | Status: DC

## 2019-02-23 ENCOUNTER — Telehealth: Payer: Self-pay

## 2019-02-23 NOTE — Telephone Encounter (Signed)
Patient calling and states that he is interested in doing a virtual visit for his appointment on 03/20/2019. Please advise.

## 2019-02-23 NOTE — Telephone Encounter (Signed)
Author phoned pt. To offer virtual awv. No answer. Generic VM left regarding virtual visit option and request to return call to see if he can do his 4/20 with Dr. Selena Batten virtually as an awv.

## 2019-02-27 NOTE — Telephone Encounter (Signed)
I left a message for the pt to return my call. 

## 2019-03-02 ENCOUNTER — Telehealth: Payer: Self-pay

## 2019-03-02 NOTE — Telephone Encounter (Signed)
Left message for patient to call back regarding his appointment with Dr. Varanasi tomorrow 

## 2019-03-02 NOTE — Telephone Encounter (Signed)
Virtual Visit Pre-Appointment Phone Call   TELEPHONE CALL NOTE  Logan Sanchez has been deemed a candidate for a follow-up tele-health visit to limit community exposure during the Covid-19 pandemic. I spoke with the patient via phone to ensure availability of phone/video source, confirm preferred email & phone number, and discuss instructions and expectations.  I reminded Logan Sanchez to be prepared with any vital sign and/or heart rhythm information that could potentially be obtained via home monitoring, at the time of his visit. I reminded Logan Sanchez to expect a phone call at the time of his visit if his visit.  Did the patient verbally acknowledge consent to treatment? YES  Arranged for VIDEO Visit with Dr. Eldridge Dace on 4/6  Lattie Haw, [place] 03/02/2019 4:01 PM   DOWNLOADING THE WEBEX SOFTWARE TO SMARTPHONE  - If Apple, go to [employer] and type in WebEx in the search bar. Download [employer], the blue/green circle. The app is free but as with any other app downloads, their phone may require them to verify saved payment information or Apple password. The patient does NOT have to create an account.  - If Android, ask patient to go to [employer] and type in WebEx in the search bar. Download [employer], the blue/green circle. The app is free but as with any other app downloads, their phone may require them to verify saved payment information or Android password. The patient does NOT have to create an account.   CONSENT FOR TELE-HEALTH VISIT - PLEASE REVIEW  I hereby voluntarily request, consent and authorize [facility] and its employed or contracted physicians, physician assistants, nurse practitioners or other licensed health care professionals (the Practitioner), to provide me with telemedicine health care services (the "Services") as deemed necessary by the treating Practitioner. I acknowledge and consent to receive the Services by the  Practitioner via telemedicine. I understand that the telemedicine visit will involve communicating with the Practitioner through live audiovisual communication technology and the disclosure of certain medical information by electronic transmission. I acknowledge that I have been given the opportunity to request an in-person assessment or other available alternative prior to the telemedicine visit and am voluntarily participating in the telemedicine visit.  I understand that I have the right to withhold or withdraw my consent to the use of telemedicine in the course of my care at any time, without affecting my right to future care or treatment, and that the Practitioner or I may terminate the telemedicine visit at any time. I understand that I have the right to inspect all information obtained and/or recorded in the course of the telemedicine visit and may receive copies of available information for a reasonable fee.  I understand that some of the potential risks of receiving the Services via telemedicine include:  Marland Kitchen Delay or interruption in medical evaluation due to technological equipment failure or disruption; . Information transmitted may not be sufficient (e.g. poor resolution of images) to allow for appropriate medical decision making by the Practitioner; and/or  . In rare instances, security protocols could fail, causing a breach of personal health information.  Furthermore, I acknowledge that it is my responsibility to provide information about my medical history, conditions and care that is complete and accurate to the best of my ability. I acknowledge that Practitioner's advice, recommendations, and/or decision may be based on factors not within their control, such as incomplete or inaccurate data provided by me or distortions of diagnostic images  or specimens that may result from electronic transmissions. I understand that the practice of medicine is not an exact science and that Practitioner makes  no warranties or guarantees regarding treatment outcomes. I acknowledge that I will receive a copy of this consent concurrently upon execution via email to the email address I last provided but may also request a printed copy by calling the office of [facility].    I understand that my insurance will be billed for this visit.   I have read or had this consent read to me. . I understand the contents of this consent, which adequately explains the benefits and risks of the Services being provided via telemedicine.  . I have been provided ample opportunity to ask questions regarding this consent and the Services and have had my questions answered to my satisfaction. . I give my informed consent for the services to be provided through the use of telemedicine in my medical care  By participating in this telemedicine visit I agree to the above.

## 2019-03-03 ENCOUNTER — Ambulatory Visit: Payer: Medicare HMO | Admitting: Interventional Cardiology

## 2019-03-05 NOTE — Progress Notes (Signed)
Virtual Visit via Video Note   This visit type was conducted due to national recommendations for restrictions regarding the COVID-19 Pandemic (e.g. social distancing) in an effort to limit this patient's exposure and mitigate transmission in our community.  Due to his co-morbid illnesses, this patient is at least at moderate risk for complications without adequate follow up.  This format is felt to be most appropriate for this patient at this time.  All issues noted in this document were discussed and addressed.  A limited physical exam was performed with this format.  Please refer to the patient's chart for his consent to telehealth for [facility].  Evaluation Performed:  Follow-up visit  This visit type was conducted due to national recommendations for restrictions regarding the COVID-19 Pandemic (e.g. social distancing).  This format is felt to be most appropriate for this patient at this time.  All issues noted in this document were discussed and addressed.  No physical exam was performed (except for noted visual exam findings with Video Visits).  Please refer to the patient's chart (MyChart message for video visits and phone note for telephone visits) for the patient's consent to telehealth for [facility].  Date:  03/06/2019   ID:  Logan Sanchez, DOB 07/06/1971, MRN [id number]  Patient Location:  Home  Provider location:   [place]  PCP:  Terressa Koyanagi, DO  Cardiologist:  Lance Muss, MD  Electrophysiologist:  None   Chief Complaint:  CAD  History of Present Illness:    Logan Sanchez is a 48 y.o. male who presents via audio/video conferencing for a telehealth visit today.   He has CAD. He had a cath in Jan 2017. Cath showed 99% stenosed mild LAD s/p overlapping drug-eluting stents; mid Cx 80% stenosed s/p Synergy drug-eluting stent. DAPT for at least year but probably indefiniteclopidogrelgiven diffuse coronary disease. We consideredbringing him back for  further evaluation of the proximal right coronary lesion which appeared to have some spontaneous dissection. No angina so he has been medically managed.  Alcohol use has been an issue in the past. He decreased fried foods as well. He was walking more, trying to lose weight. He lost 10 lbs in 2018.He had gained the weight back.  He had elevated TG but Vascepa was too expensive.   He has been feeling ok lately.    In 01/2019, he had some chest pressure with stress test showing:  Patient walked on a Bruce protocol treadmill test for a total of 4 minutes.  He achieved a peak heart rate of 176 which is 101% predicted maximal heart rate.  There was no ST segment deviation noted during stress.  Nuclear stress EF: 55%. The left ventricular ejection fraction is normal (55-65%).  This is a low risk study. There is no evidence of ischemia. There is no evidence of previous infarction. The patient has limited exercise capacity  The study is normal.  Sx improved after he got back on his stomach medicines.  He thinks it was more related to gas.   He walks regularly without symptoms.  He has started mowing the lawn.  Denies : Chest pain. Dizziness. Leg edema. Nitroglycerin use. Orthopnea. Palpitations. Paroxysmal nocturnal dyspnea. Shortness of breath. Syncope.   No bleeding problems. No headaches despite increased Imdur.   The patient does not have symptoms concerning for COVID-19 infection (fever, chills, cough, or new shortness of breath).    Prior CV studies:   The following studies were reviewed today:  *  cath results as above;   Past Medical History:  Diagnosis Date  . Alcohol abuse   . Angina pectoris (HCC) 12/09/2015  . Brain injury [facility](HCC)    report on disability for this, sees Dr. Arvilla MarketShwartz  . Coronary artery disease    a. cath 12/11/15 99% stenosed mild LAD s/p overlapping drug-eluting stents; mid Cx 80% stenosed s/p Synergy drug-eluting stent; pro RCA 70% - medical managment  for now, could consider cath  . Depression   . Elevated LFTs   . Erectile dysfunction   . Hyperlipemia   . Hypertension   . Mood disorder (HCC)    depression and GAD  . Obesity   . Rheumatoid arthritis involving right hip [facility](HCC)    Past Surgical History:  Procedure Laterality Date  . CARDIAC CATHETERIZATION N/A 12/11/2015   Procedure: Left Heart Cath and Coronary Angiography;  Surgeon: Corky CraftsJayadeep S Zeffie Bickert, MD;  [facility];  Service: Cardiovascular;  Laterality: N/A;  . CARDIAC CATHETERIZATION N/A 12/11/2015   Procedure: Coronary Stent Intervention;  Surgeon: Corky CraftsJayadeep S Zakry Caso, MD;  Location: [facility] INVASIVE CV LAB;  Service: Cardiovascular;  Laterality: N/A;  . CORONARY ANGIOPLASTY    . FOREARM FRACTURE SURGERY Right ~ 1998   "I've got a metal rod in it"  . FRACTURE SURGERY       Current Meds  Medication Sig  . allopurinol (ZYLOPRIM) 100 MG tablet Take 2 tabs daily  . amLODipine (NORVASC) 10 MG tablet Take 1 tablet (10 mg total) by mouth daily.  Marland Kitchen. aspirin EC 81 MG tablet Take 1 tablet (81 mg total) by mouth daily.  Marland Kitchen. atorvastatin (LIPITOR) 80 MG tablet Take 0.5 tablets (40 mg total) by mouth daily.  . clopidogrel (PLAVIX) 75 MG tablet Take 1 tablet (75 mg total) by mouth daily with breakfast.  . isosorbide mononitrate (IMDUR) 60 MG 24 hr tablet Take 1 tablet (60 mg total) by mouth daily.  Marland Kitchen. losartan-hydrochlorothiazide (HYZAAR) 100-25 MG tablet TAKE 1 TABLET BY MOUTH EVERY DAY  . metoprolol succinate (TOPROL-XL) 100 MG 24 hr tablet Take 1 tablet (100 mg total) by mouth daily. Take with or immediately following a meal.  . Misc Natural Products (GINSENG COMPLEX PO) Take 1 tablet by mouth daily.  . nitroGLYCERIN (NITROSTAT) 0.4 MG SL tablet Place 1 tablet (0.4 mg total) under the tongue every 5 (five) minutes as needed for chest pain.  . pantoprazole (PROTONIX) 40 MG tablet Take 1 tablet (40 mg total) by mouth daily.  Marland Kitchen. PARoxetine (PAXIL) 20 MG tablet Take 1 tablet (20  mg total) by mouth daily.     Allergies:   Patient has no known allergies.   Social History   Tobacco Use  . Smoking status: Former Smoker    Packs/day: 1.50    Years: 25.00    Pack years: 37.50    Types: Cigarettes    Last attempt to quit: 09/30/2014    Years since quitting: 4.4  . Smokeless tobacco: Never Used  Substance Use Topics  . Alcohol use: Yes    Alcohol/week: 76.0 standard drinks    Types: 23 Cans of beer, 53 Shots of liquor per week    Comment: 03/08/2017 drinks small beers rarely; no return ot heavy drinking   . Drug use: No     Family Hx: The patient's family history includes Heart attack in his maternal grandfather and maternal grandmother; Hypertension in his father, maternal grandfather, maternal grandmother, mother, paternal grandfather, and paternal grandmother; Stroke in his maternal grandmother.  ROS:   Please see the history of present illness.    Occasional gas pains, when he does not take stomach.  All other systems reviewed and are negative.   Labs/Other Tests and Data Reviewed:    Recent Labs: 02/06/2019: ALT 46; BUN 9; Creatinine, Ser 0.97; Hemoglobin 14.2; Platelets 258; Potassium 3.5; Sodium 140   Recent Lipid Panel Lab Results  Component Value Date/Time   CHOL 186 02/06/2019 08:31 AM   TRIG 482 (H) 02/06/2019 08:31 AM   HDL 43 02/06/2019 08:31 AM   CHOLHDL 4.3 02/06/2019 08:31 AM   CHOLHDL 6 10/01/2017 10:55 AM   LDLCALC Comment 02/06/2019 08:31 AM   LDLDIRECT 142 (H) 06/03/2018 08:44 AM   LDLDIRECT 177.0 10/01/2017 10:55 AM    Wt Readings from Last 3 Encounters:  03/06/19 285 lb (129.3 kg)  02/06/19 285 lb (129.3 kg)  01/10/19 285 lb (129.3 kg)     Objective:    Vital Signs:  Ht 5\' 6"  (1.676 m)   Wt 285 lb (129.3 kg)   BMI 46.00 kg/m    He does not have a BP cuff.    Well nourished, well developed male in no acute distress. No obvious shortness of breath  ASSESSMENT & PLAN:    1.  CAD: No angina. Continue aggressive  secondary prevention.  Continue to be active.  Encourage plant based diet.   2. HTN: Not checking BP but will look at getting a cuff. Continue current meds.   3. Hyperlipidemia: TG increased.  Vascepa was expensive so will check with PharmD for other options.   4: Morbid obesity: He is trying to bake foods rather than fry.  He is eating a lot of salt.  He does have a scale at home.  He has not been using it at home.  I asked that he do that.    COVID-19 Education: The signs and symptoms of COVID-19 were discussed with the patient and how to seek care for testing (follow up with PCP or arrange E-visit).  The importance of social distancing was discussed today.  Patient Risk:   After full review of this patient's clinical status, I feel that they are at least moderate risk at this time.  Time:   Today, I have spent minutes with the patient with telehealth technology discussing .     Medication Adjustments/Labs and Tests Ordered: Current medicines are reviewed at length with the patient today.  Concerns regarding medicines are outlined above.  Tests Ordered: No orders of the defined types were placed in this encounter.  Medication Changes: No orders of the defined types were placed in this encounter.   Disposition:  Follow up in 6 month(s)  Signed, Lance Muss, MD  03/06/2019 9:53 AM    [facility]

## 2019-03-06 ENCOUNTER — Encounter: Payer: Self-pay | Admitting: Interventional Cardiology

## 2019-03-06 ENCOUNTER — Telehealth: Payer: Self-pay | Admitting: Interventional Cardiology

## 2019-03-06 ENCOUNTER — Other Ambulatory Visit: Payer: Self-pay

## 2019-03-06 ENCOUNTER — Telehealth (INDEPENDENT_AMBULATORY_CARE_PROVIDER_SITE_OTHER): Payer: Medicare HMO | Admitting: Interventional Cardiology

## 2019-03-06 VITALS — Ht 66.0 in | Wt 285.0 lb

## 2019-03-06 DIAGNOSIS — I10 Essential (primary) hypertension: Secondary | ICD-10-CM

## 2019-03-06 DIAGNOSIS — I2510 Atherosclerotic heart disease of native coronary artery without angina pectoris: Secondary | ICD-10-CM

## 2019-03-06 DIAGNOSIS — E782 Mixed hyperlipidemia: Secondary | ICD-10-CM

## 2019-03-06 NOTE — Telephone Encounter (Signed)
New Message   Patient is calling in reference to his upcoming virtual visit today. He is not sure on how he is able to set the virtual visit up. Please call.

## 2019-03-06 NOTE — Patient Instructions (Signed)

## 2019-03-06 NOTE — Telephone Encounter (Signed)
Already spoke to patient and he is currently doing his VIDEO Visit with Dr. Eldridge Dace.

## 2019-03-09 ENCOUNTER — Telehealth: Payer: Self-pay

## 2019-03-09 NOTE — Telephone Encounter (Signed)
Corky Crafts, MD  You 1 hour ago (8:18 AM)      OK for fenofibrate per Aundra Millet.      Documentation     [place], Emeline Darling, RPH  Corky Crafts, MD 3 days ago    Would recommend starting fenofibrate 145mg  daily to help lower TG since Vascepa is cost prohibitive. Thanks, [employer] comment     [place], Donnie Coffin, MD  Terressa Koyanagi, DO; [place], Emeline Darling, [place] 3 days ago    Dortha Kern was too expensive. Any other options for his lipids. THanks. JV   Routing comment

## 2019-03-09 NOTE — Progress Notes (Signed)
OK for fenofibrate per Aundra Millet.

## 2019-03-09 NOTE — Telephone Encounter (Signed)
Left message for patient to call back  

## 2019-03-14 MED ORDER — FENOFIBRATE 145 MG PO TABS: 145.0000 mg | ORAL_TABLET | Freq: Every day | ORAL | 3 refills | Status: AC

## 2019-03-14 NOTE — Telephone Encounter (Signed)
Called and instructed patient to start fenofibrate 145 mg QD. Patient verbalized understanding and Rx sent to preferred pharmacy.

## 2019-03-15 NOTE — Telephone Encounter (Signed)
I called the pt and informed him a visit can now be done via [email or web address].  Patient agreed and a text message was sent to his cell number.

## 2019-03-20 ENCOUNTER — Ambulatory Visit (INDEPENDENT_AMBULATORY_CARE_PROVIDER_SITE_OTHER): Payer: Medicare HMO | Admitting: Family Medicine

## 2019-03-20 ENCOUNTER — Encounter: Payer: Self-pay | Admitting: Family Medicine

## 2019-03-20 ENCOUNTER — Other Ambulatory Visit: Payer: Self-pay

## 2019-03-20 DIAGNOSIS — E782 Mixed hyperlipidemia: Secondary | ICD-10-CM | POA: Diagnosis not present

## 2019-03-20 DIAGNOSIS — K219 Gastro-esophageal reflux disease without esophagitis: Secondary | ICD-10-CM

## 2019-03-20 DIAGNOSIS — I2510 Atherosclerotic heart disease of native coronary artery without angina pectoris: Secondary | ICD-10-CM | POA: Diagnosis not present

## 2019-03-20 DIAGNOSIS — F1010 Alcohol abuse, uncomplicated: Secondary | ICD-10-CM

## 2019-03-20 DIAGNOSIS — R739 Hyperglycemia, unspecified: Secondary | ICD-10-CM

## 2019-03-20 DIAGNOSIS — F3342 Major depressive disorder, recurrent, in full remission: Secondary | ICD-10-CM | POA: Diagnosis not present

## 2019-03-20 DIAGNOSIS — I10 Essential (primary) hypertension: Secondary | ICD-10-CM

## 2019-03-20 NOTE — Progress Notes (Signed)
Virtual Visit via Video Note  I connected with Logan Sanchez  on 03/20/19 at  8:30 AM EDT by a video enabled telemedicine application and verified that I am speaking with the correct person using two identifiers.  Location patient: home Location provider:work or home office Persons participating in the virtual visit: patient, provider  I discussed the limitations of evaluation and management by telemedicine and the availability of in person appointments. The patient expressed understanding and agreed to proceed.   HPI:  Logan Sanchez is a pleasant 48 y.o.  Seen for follow up. Chronic medical problems summarized below were reviewed for changes and stability and were updated as needed below. These issues and their treatment remain stable for the most part.  He saw his cardiologist recently and they advised he get a home cuff to monitor BP. They are checking on other options with his pharamcy for treatment of the cholesterol as he could not afford the Vescepa. Looks like they are starting him on fenofibrate. He ust had labs in March - reviewed. His triglycerides were elevated. His LFTs were mildly elevated. His cardiologist decreased his statin and advised avoiding alcohol and tylenol. He reports he does "occasionally" drink a beer, but has cut back to 1-2 per week. He had a myocardial stress test recently and it looked good. No symptoms of COVID19. He is working out again and trying to eat healthy. Denies CP, SOB, DOE, treatment intolerance or new symptoms.  AWV 03/03/18 Reports mood good. See phq9.  CAD, HTN, HLD: -sees cardiologist - Dr. Eldridge Dace -Referred to lipid clinic by his cardiologist - appreciate recs/care -s/p DES x2 in 2017 -meds: asa, norvasc, lipitor, plavix, fish oil, imdur, losartan-hctz, metoprolol, fenofibrate  MorbidObesity/Hyperglycemia: -have provided him with extensive lifestyle recommendations and other options for wt reduction -he was able to loose some weight in  2018  Hx tobacco use, alcohol abuse, depression and anxiety: -quit smoking -cut back on alcohol -meds: paxil  -doing ok despite COVID19 panedmic-no depression or thoughts of harm  MDD/Anxiety: -on paxil longterm  GERD: -meds: protonix  Gout: -meds: allopurinol   ROS: See pertinent positives and negatives per HPI.  Past Medical History:  Diagnosis Date  . Alcohol abuse   . Angina pectoris (HCC) 12/09/2015  . Brain injury [facility])    report on disability for this, sees Dr. Arvilla Market  . Coronary artery disease    a. cath 12/11/15 99% stenosed mild LAD s/p overlapping drug-eluting stents; mid Cx 80% stenosed s/p Synergy drug-eluting stent; pro RCA 70% - medical managment for now, could consider cath  . Depression   . Elevated LFTs   . Erectile dysfunction   . Hyperlipemia   . Hypertension   . Mood disorder (HCC)    depression and GAD  . Obesity   . Rheumatoid arthritis involving right hip [facility])     Past Surgical History:  Procedure Laterality Date  . CARDIAC CATHETERIZATION N/A 12/11/2015   Procedure: Left Heart Cath and Coronary Angiography;  Surgeon: Corky Crafts, MD;  Location: [facility] INVASIVE CV LAB;  Service: Cardiovascular;  Laterality: N/A;  . CARDIAC CATHETERIZATION N/A 12/11/2015   Procedure: Coronary Stent Intervention;  Surgeon: Corky Crafts, MD;  Location[facility] INVASIVE CV LAB;  Service: Cardiovascular;  Laterality: N/A;  . CORONARY ANGIOPLASTY    . FOREARM FRACTURE SURGERY Right ~ 1998   "I've got a metal rod in it"  . FRACTURE SURGERY      Family History  Problem Relation Age of Onset  .  Hypertension Mother   . Hypertension Father   . Heart attack Maternal Grandmother   . Hypertension Maternal Grandmother   . Stroke Maternal Grandmother   . Heart attack Maternal Grandfather   . Hypertension Maternal Grandfather   . Hypertension Paternal Grandmother   . Hypertension Paternal Grandfather     SOCIAL HX: see HPI   Current Outpatient  Medications:  .  allopurinol (ZYLOPRIM) 100 MG tablet, Take 2 tabs daily, Disp: 60 tablet, Rfl: 0 .  amLODipine (NORVASC) 10 MG tablet, Take 1 tablet (10 mg total) by mouth daily., Disp: 90 tablet, Rfl: 3 .  aspirin EC 81 MG tablet, Take 1 tablet (81 mg total) by mouth daily., Disp: 90 tablet, Rfl: 3 .  atorvastatin (LIPITOR) 80 MG tablet, Take 0.5 tablets (40 mg total) by mouth daily., Disp: 45 tablet, Rfl: 3 .  clopidogrel (PLAVIX) 75 MG tablet, Take 1 tablet (75 mg total) by mouth daily with breakfast., Disp: 90 tablet, Rfl: 1 .  fenofibrate (TRICOR) 145 MG tablet, Take 1 tablet (145 mg total) by mouth daily., Disp: 90 tablet, Rfl: 3 .  isosorbide mononitrate (IMDUR) 60 MG 24 hr tablet, Take 1 tablet (60 mg total) by mouth daily., Disp: 90 tablet, Rfl: 3 .  losartan-hydrochlorothiazide (HYZAAR) 100-25 MG tablet, TAKE 1 TABLET BY MOUTH EVERY DAY, Disp: 90 tablet, Rfl: 1 .  metoprolol succinate (TOPROL-XL) 100 MG 24 hr tablet, Take 1 tablet (100 mg total) by mouth daily. Take with or immediately following a meal., Disp: 90 tablet, Rfl: 3 .  Misc Natural Products (GINSENG COMPLEX PO), Take 1 tablet by mouth daily., Disp: , Rfl:  .  nitroGLYCERIN (NITROSTAT) 0.4 MG SL tablet, Place 1 tablet (0.4 mg total) under the tongue every 5 (five) minutes as needed for chest pain., Disp: 25 tablet, Rfl: 3 .  pantoprazole (PROTONIX) 40 MG tablet, Take 1 tablet (40 mg total) by mouth daily., Disp: 90 tablet, Rfl: 3 .  PARoxetine (PAXIL) 20 MG tablet, Take 1 tablet (20 mg total) by mouth daily., Disp: 90 tablet, Rfl: 1  EXAM:  VITALS per patient if applicable: none  GENERAL: alert, oriented, appears well and in no acute distress  HEENT: atraumatic, conjunttiva clear, no obvious abnormalities on inspection of external nose and ears  NECK: normal movements of the head and neck  LUNGS: on inspection no signs of respiratory distress, breathing rate appears normal, no obvious gross SOB, gasping or  wheezing  CV: no obvious cyanosis  MS: moves all visible extremities without noticeable abnormality  PSYCH/NEURO: pleasant and cooperative, no obvious depression or anxiety, speech and thought processing grossly intact  ASSESSMENT AND PLAN:  Discussed the following assessment and plan:  Essential hypertension  Mixed hyperlipidemia  Recurrent major depressive disorder, in full remission (HCC)  Morbid obesity (HCC)  Alcohol abuse  Hyperglycemia  Coronary artery disease involving native coronary artery of native heart without angina pectoris  Gastroesophageal reflux disease, esophagitis presence not specified  Check BP and weight for and keep log to review at wellness visit.  Lifestyle recs.  Advised to cut back and quit alcohol use - congratulated on progress. Offered help.  He declined the need for refills.  Schedule AWV - he prefers to do with me. Will follow up on weight and BP levels then.  Reviewed labs/note/studies from cardiology. Appreciate care.   TOC with Dr. Hassan RowanKoberlein in 2-3 months as I will not be seeing patients in clinic after May 1st.     I discussed the assessment  and treatment plan with the patient. The patient was provided an opportunity to ask questions and all were answered. The patient agreed with the plan and demonstrated an understanding of the instructions.   The patient was advised to call back or seek an in-person evaluation if needed.   Follow up instructions: Advised assistant Ronnald Collum to help patient arrange the following: -AWV with Dr. Selena Batten when convenient for pt in next 1 month -TOC with Dr. Hassan Rowan in 3 months    Terressa Koyanagi, DO

## 2019-03-21 ENCOUNTER — Other Ambulatory Visit: Payer: Self-pay | Admitting: Interventional Cardiology

## 2019-03-21 ENCOUNTER — Other Ambulatory Visit: Payer: Self-pay | Admitting: Family Medicine

## 2019-03-21 ENCOUNTER — Ambulatory Visit: Payer: Medicare HMO

## 2019-03-21 NOTE — Telephone Encounter (Signed)
OK to refill if that is what he is taking

## 2019-03-22 NOTE — Telephone Encounter (Signed)
Left a message for patient to call back. Need to verify his statin med/dose. 

## 2019-03-23 ENCOUNTER — Other Ambulatory Visit: Payer: Medicare HMO

## 2019-03-24 NOTE — Telephone Encounter (Signed)
Left a message for patient to call back. Need to verify his statin med/dose.

## 2019-04-14 NOTE — Telephone Encounter (Signed)
Left a message for patient to call back on both #s. Need to verify his statin med/dose.

## 2019-04-18 ENCOUNTER — Ambulatory Visit (INDEPENDENT_AMBULATORY_CARE_PROVIDER_SITE_OTHER): Payer: Medicare HMO | Admitting: Family Medicine

## 2019-04-18 ENCOUNTER — Other Ambulatory Visit: Payer: Self-pay

## 2019-04-18 ENCOUNTER — Encounter: Payer: Self-pay | Admitting: Family Medicine

## 2019-04-18 VITALS — BP 128/85

## 2019-04-18 DIAGNOSIS — Z8669 Personal history of other diseases of the nervous system and sense organs: Secondary | ICD-10-CM | POA: Diagnosis not present

## 2019-04-18 DIAGNOSIS — R739 Hyperglycemia, unspecified: Secondary | ICD-10-CM | POA: Diagnosis not present

## 2019-04-18 DIAGNOSIS — I10 Essential (primary) hypertension: Secondary | ICD-10-CM

## 2019-04-18 DIAGNOSIS — Z0000 Encounter for general adult medical examination without abnormal findings: Secondary | ICD-10-CM

## 2019-04-18 DIAGNOSIS — E782 Mixed hyperlipidemia: Secondary | ICD-10-CM | POA: Diagnosis not present

## 2019-04-18 NOTE — Patient Instructions (Signed)
  Mr. Logan Sanchez , Thank you for taking time for your Medicare Wellness Visit. I appreciate your ongoing commitment to your health goals. Please review the following plan we discussed and let me know if I can assist you in the future.   These are the goals we discussed: Goals      Exercise   . Please increase to 20-30 minutes 6 days per week      General   . Cut out alcohol - ride bike when you think about a having a beer      Weight   . Goal to get Weight (lb) < 235 lb (106.6 kg)      -We placed a referral for you as discussed to the eye doctor. It usually takes about 1-2 weeks to process and schedule this referral. If you have not heard from [place] regarding this appointment in 2 weeks please contact our office.  This is a list of the screening recommended for you and due dates:  Health Maintenance  Topic Date Due  . HIV Screening  04/17/2021* declined  . Flu Shot  07/01/2019  . Tetanus Vaccine  03/17/2027  *Topic was postponed. The date shown is not the original due date.

## 2019-04-18 NOTE — Progress Notes (Signed)
Medicare Annual Preventive Care Visit  (initial annual wellness or annual wellness exam)  Virtual Visit via Video Note  I connected with Logan Sanchez  on 04/18/19 at  3:20 PM EDT by a video enabled telemedicine application and verified that I am speaking with the correct person using two identifiers.  Location patient: home Location provider:work or home office Persons participating in the virtual visit: patient, provider  Concerns and/or follow up today:  PMH CAD, HTN, HLD, Morbid Obesity, Hyperglycemia, Hx of tobacco and alcohol, MDD, Anxiety and Gout. Drinking less alcohol. Has been felling well. Doing some exercise on stationary bike 2 days per week. No new concerns. On ROS reports a remote hx of glaucoma. Does not see an eye doctor. Reports his vision is fine and has no complaints. Reports he is getting labs with his cardiologist.   See HM section in Epic for other details of completed HM. See scanned documentation under Media Tab for further documentation HPI, health risk assessment. See Media Tab and Care Teams sections in Epic for other providers.  ROS: negative for report of fevers, unintentional weight loss, vision changes, vision loss, hearing loss or change, chest pain, sob, hemoptysis, melena, hematochezia, hematuria, genital discharge or lesions, falls, bleeding or bruising, loc, thoughts of suicide or self harm, memory loss  1.) Patient-completed health risk assessment  - completed and reviewed, see scanned documentation  2.) Review of Medical History: -PMH, PSH, Family History and current specialty and care providers reviewed and updated and listed below  - see scanned in document in chart and below  Past Medical History:  Diagnosis Date  . Alcohol abuse   . Angina pectoris (HCC) 12/09/2015  . Brain injury [facility])    report on disability for this, sees Dr. Arvilla Market  . Coronary artery disease    a. cath 12/11/15 99% stenosed mild LAD s/p overlapping drug-eluting stents; mid Cx  80% stenosed s/p Synergy drug-eluting stent; pro RCA 70% - medical managment for now, could consider cath  . Depression   . Elevated LFTs   . Erectile dysfunction   . Hyperlipemia   . Hypertension   . Mood disorder (HCC)    depression and GAD  . Obesity   . Rheumatoid arthritis involving right hip [facility])     Past Surgical History:  Procedure Laterality Date  . CARDIAC CATHETERIZATION N/A 12/11/2015   Procedure: Left Heart Cath and Coronary Angiography;  Surgeon: Corky Crafts, MD;  Location: [facility] INVASIVE CV LAB;  Service: Cardiovascular;  Laterality: N/A;  . CARDIAC CATHETERIZATION N/A 12/11/2015   Procedure: Coronary Stent Intervention;  Surgeon: Corky Crafts, MD;  Location: [facility] INVASIVE CV LAB;  Service: Cardiovascular;  Laterality: N/A;  . CORONARY ANGIOPLASTY    . FOREARM FRACTURE SURGERY Right ~ 1998   "I've got a metal rod in it"  . FRACTURE SURGERY      Social History   Socioeconomic History  . Marital status: Single    Spouse name: Not on file  . Number of children: Not on file  . Years of education: Not on file  . Highest education level: Not on file  Occupational History  . Not on file  Social Needs  . Financial resource strain: Not on file  . Food insecurity:    Worry: Not on file    Inability: Not on file  . Transportation needs:    Medical: Not on file    Non-medical: Not on file  Tobacco Use  . Smoking status: Former Smoker  Packs/day: 1.50    Years: 25.00    Pack years: 37.50    Types: Cigarettes    Last attempt to quit: 09/30/2014    Years since quitting: 4.5  . Smokeless tobacco: Never Used  Substance and Sexual Activity  . Alcohol use: Yes    Alcohol/week: 76.0 standard drinks    Types: 23 Cans of beer, 53 Shots of liquor per week    Comment: 03/08/2017 drinks small beers rarely; no return ot heavy drinking   . Drug use: No  . Sexual activity: Not Currently  Lifestyle  . Physical activity:    Days per week: Not on file    Minutes  per session: Not on file  . Stress: Not on file  Relationships  . Social connections:    Talks on phone: Not on file    Gets together: Not on file    Attends religious service: Not on file    Active member of club or organization: Not on file    Attends meetings of clubs or organizations: Not on file    Relationship status: Not on file  . Intimate partner violence:    Fear of current or ex partner: Not on file    Emotionally abused: Not on file    Physically abused: Not on file    Forced sexual activity: Not on file  Other Topics Concern  . Not on file  Social History Narrative   Work or School: on disability for brain injury      Home Situation:       Spiritual Beliefs:      Lifestyle: no regular exercise, poor diet       Family History  Problem Relation Age of Onset  . Hypertension Mother   . Hypertension Father   . Heart attack Maternal Grandmother   . Hypertension Maternal Grandmother   . Stroke Maternal Grandmother   . Heart attack Maternal Grandfather   . Hypertension Maternal Grandfather   . Hypertension Paternal Grandmother   . Hypertension Paternal Grandfather     Current Outpatient Medications on File Prior to Visit  Medication Sig Dispense Refill  . allopurinol (ZYLOPRIM) 100 MG tablet Take 2 tabs daily 60 tablet 0  . amLODipine (NORVASC) 10 MG tablet Take 1 tablet (10 mg total) by mouth daily. 90 tablet 3  . aspirin EC 81 MG tablet Take 1 tablet (81 mg total) by mouth daily. 90 tablet 3  . atorvastatin (LIPITOR) 80 MG tablet Take 0.5 tablets (40 mg total) by mouth daily. 45 tablet 3  . clopidogrel (PLAVIX) 75 MG tablet Take 1 tablet (75 mg total) by mouth daily with breakfast. Pt needs to make appt to get more refills 90 tablet 0  . fenofibrate (TRICOR) 145 MG tablet Take 1 tablet (145 mg total) by mouth daily. 90 tablet 3  . isosorbide mononitrate (IMDUR) 60 MG 24 hr tablet Take 1 tablet (60 mg total) by mouth daily. 90 tablet 3  .  losartan-hydrochlorothiazide (HYZAAR) 100-25 MG tablet TAKE 1 TABLET BY MOUTH EVERY DAY 90 tablet 1  . metoprolol succinate (TOPROL-XL) 100 MG 24 hr tablet Take 1 tablet (100 mg total) by mouth daily. Take with or immediately following a meal. 90 tablet 3  . Misc Natural Products (GINSENG COMPLEX PO) Take 1 tablet by mouth daily.    . nitroGLYCERIN (NITROSTAT) 0.4 MG SL tablet Place 1 tablet (0.4 mg total) under the tongue every 5 (five) minutes as needed for chest pain. 25 tablet 3  .  pantoprazole (PROTONIX) 40 MG tablet Take 1 tablet (40 mg total) by mouth daily. 90 tablet 3  . PARoxetine (PAXIL) 20 MG tablet Take 1 tablet (20 mg total) by mouth daily. 90 tablet 1   No current facility-administered medications on file prior to visit.      3.) Review of functional ability and level of safety:  Any difficulty hearing?  See scanned documentation  History of falling?  See scanned documentation  Any trouble with IADLs - using a phone, using transportation, grocery shopping, preparing meals, doing housework, doing laundry, taking medications and managing money?  See scanned documentation  Advance Directives?  Discussed briefly and offered more resources. He agrees to consider.  See summary of recommendations in Patient Instructions below.  4.) Physical Exam Vitals:   04/18/19 1709  BP: 128/85   Estimated body mass index is 46 kg/m as calculated from the following:   Height as of 03/06/19: 5\' 6"  (1.676 m).   Weight as of 03/06/19: 285 lb (129.3 kg).  EKG (optional): deferred  VITALS per patient if applicable: see above  GENERAL: alert, oriented, appears well and in no acute distress; visual acuity grossly intact, vision exam deferred  HEENT: atraumatic, conjunttiva clear, no obvious abnormalities on inspection of external nose and ears  NECK: normal movements of the head and neck  LUNGS: on inspection no signs of respiratory distress, breathing rate appears normal, no obvious  gross SOB, gasping or wheezing  CV: no obvious cyanosis  MS: moves all visible extremities without noticeable abnormality  PSYCH/NEURO: pleasant and cooperative, no obvious depression or anxiety, speech and thought processing grossly intact, Cognitive function grossly intact  See patient instructions for recommendations.  Education and counseling regarding the above review of health provided with a plan for the following: -see scanned patient completed form for further details -fall prevention strategies discussed  -healthy lifestyle discussed -importance and resources for completing advanced directives discussed -see patient instructions below for any other recommendations provided  4)The following written screening schedule of preventive measures were reviewed with assessment and plan made per below, orders and patient instructions:       Alcohol screening done     Obesity Screening and counseling done     STI screening (Hep C if born 80-[birth year]) offered and per pt wishes     Tobacco Screening done       Pneumococcal (PPSV23 -one dose after 64, one before if risk factors), influenza yearly and hepatitis B vaccines (if high risk - end stage renal disease, IV drugs, homosexual men, live in home for mentally retarded, hemophilia receiving factors) ASSESSMENT/PLAN: done if applicable      Colorectal cancer screening (FOBT yearly or flex sig q4y or colonoscopy q10y or barium enema q4y) ASSESSMENT/PLAN: n/a      Diabetes outpatient self-management training services ASSESSMENT/PLAN: utd or done       Screening for glaucoma(q1y if high risk - diabetes, FH, AA and > 50 or hispanic and > 65) ASSESSMENT/PLAN: utd or advised eye exam and optho referral placed      Medical nutritional therapy for individuals with diabetes or renal disease ASSESSMENT/PLAN: see orders      Cardiovascular screening blood tests (lipids q5y) ASSESSMENT/PLAN: see orders and labs      Diabetes screening  tests ASSESSMENT/PLAN: see orders and labs   7.) Summary:   Medicare annual wellness visit, subsequent -risk factors and conditions per above assessment were discussed and treatment, recommendations and referrals were offered per documentation above and  orders and patient instructions.  Hyperglycemia  - added hgba1c to lab orders, he agreed to do. -lifetsyle recs - see below  Mixed hyperlipidemia Essential hypertension Hx CAD -cont care with cardiology, he has some labs pending there -lifestyle recs (see belwo)  Morbid obesity (HCC) -encouraged to cut out alcohol, eat healthy and start getting daily exercise - goal to increase cycling to 5-6 days per week. He realized that when he used to exercise more he drank less and felt better. He is going to try to get on his bike when he wants a beer.  History of glaucoma -advised evaluation with optho and referal placed. Advised to  Call in 1 week if does not receive call regarding this eval.  Patient Instructions    Mr. Jolinda CroakDoggett , Thank you for taking time for your Medicare Wellness Visit. I appreciate your ongoing commitment to your health goals. Please review the following plan we discussed and let me know if I can assist you in the future.   These are the goals we discussed: Goals      Exercise   . Please increase to 20-30 minutes 6 days per week      General   . Cut out alcohol - ride bike when you think about a having a beer      Weight   . Goal to get Weight (lb) < 235 lb (106.6 kg)      -We placed a referral for you as discussed to the eye doctor. It usually takes about 1-2 weeks to process and schedule this referral. If you have not heard from us regarding this appointment in 2 weeks please contact our office.  This is a list of the screening recommended for you and due dates:  Health Maintenance  Topic Date Due  . HIV Screening  04/17/2021* declined  . Flu Shot  07/01/2019  . Tetanus Vaccine  03/17/2027  *Topic was  postponed. The date shown is not the original due date.    Terressa KoyanagiHannah R Ajayla Iglesias, DO   Follow up instructions: Advised assistant Ronnald CollumJo Anne to help patient arrange the following: -lab appt for diabetes lab check -phq9 in epic -CPE with Dr. Hassan RowanKoberlein in 3 months -follow up with Dr. Selena BattenKim in 6 months

## 2019-04-19 ENCOUNTER — Telehealth: Payer: Self-pay | Admitting: Interventional Cardiology

## 2019-04-19 ENCOUNTER — Telehealth: Payer: Self-pay | Admitting: *Deleted

## 2019-04-19 NOTE — Telephone Encounter (Signed)
-----   Message from Terressa Koyanagi, DO sent at 04/18/2019  5:34 PM EDT ----- -lab appt for diabetes lab check -phq9 in epic -CPE with Dr. Hassan Rowan in 3 months -follow up with Dr. Selena Batten in 6 months

## 2019-04-19 NOTE — Telephone Encounter (Signed)
New Message          Patient is returning Logan Sanchez's call would like a call back, patient states he will be available for the next 2 hours.

## 2019-04-19 NOTE — Telephone Encounter (Signed)
Spoke with patient regarding his atorvastatin refill. See refill note.

## 2019-04-19 NOTE — Telephone Encounter (Signed)
Spoke to patient. He has been taking atorvastatin 20 mg 2 tablets QD. Will approve RX for 40 mg tablet.

## 2019-04-19 NOTE — Telephone Encounter (Signed)
I called the pt and scheduled a lab appt for 6/30 and a physical appt with Dr Hassan Rowan in August.  Patient stated he will schedule the phone visit when he comes in for the physical exam.  PHQ-9 was completed on 5/19 and entered in the chart.

## 2019-05-15 NOTE — Telephone Encounter (Signed)
Spoke with [facility] and patient picked up his RX on 6/11 with no issues.

## 2019-05-29 ENCOUNTER — Other Ambulatory Visit: Payer: Medicare HMO

## 2019-05-29 ENCOUNTER — Other Ambulatory Visit: Payer: Self-pay

## 2019-05-29 DIAGNOSIS — E782 Mixed hyperlipidemia: Secondary | ICD-10-CM

## 2019-05-29 LAB — HEPATIC FUNCTION PANEL
ALT: 28 IU/L (ref 0–44)
AST: 62 IU/L — ABNORMAL HIGH (ref 0–40)
Albumin: 3.7 g/dL — ABNORMAL LOW (ref 4.0–5.0)
Alkaline Phosphatase: 132 IU/L — ABNORMAL HIGH (ref 39–117)
Bilirubin Total: 1 mg/dL (ref 0.0–1.2)
Bilirubin, Direct: 0.37 mg/dL (ref 0.00–0.40)
Total Protein: 6.4 g/dL (ref 6.0–8.5)

## 2019-05-29 LAB — LIPID PANEL
Chol/HDL Ratio: 5.7 ratio — ABNORMAL HIGH (ref 0.0–5.0)
Cholesterol, Total: 164 mg/dL (ref 100–199)
HDL: 29 mg/dL — ABNORMAL LOW (ref >39)
LDL Calculated: 58 mg/dL (ref 0–99)
Triglycerides: 387 mg/dL — ABNORMAL HIGH (ref 0–149)
VLDL Cholesterol Cal: 77 mg/dL — ABNORMAL HIGH (ref 5–40)

## 2019-05-30 ENCOUNTER — Other Ambulatory Visit: Payer: Self-pay

## 2019-05-30 ENCOUNTER — Other Ambulatory Visit (INDEPENDENT_AMBULATORY_CARE_PROVIDER_SITE_OTHER): Payer: Medicare HMO

## 2019-05-30 DIAGNOSIS — M1A9XX0 Chronic gout, unspecified, without tophus (tophi): Secondary | ICD-10-CM | POA: Diagnosis not present

## 2019-05-30 DIAGNOSIS — R739 Hyperglycemia, unspecified: Secondary | ICD-10-CM

## 2019-05-30 LAB — URIC ACID: Uric Acid, Serum: 10.3 mg/dL — ABNORMAL HIGH (ref 4.0–7.8)

## 2019-05-30 LAB — HEMOGLOBIN A1C: Hgb A1c MFr Bld: 6.6 % — ABNORMAL HIGH (ref 4.6–6.5)

## 2019-05-31 ENCOUNTER — Ambulatory Visit: Payer: Self-pay | Admitting: *Deleted

## 2019-05-31 NOTE — Telephone Encounter (Signed)
Pt had called in regarding his lab results.  After consulting the chart I see where Dr. Maudie Mercury has given Lahoma Crocker, CMA instructions for him.  I called the [place] office however Denice Paradise was at lunch and it was requested I send over a note for her and they will have her call pt when she returns from lunch.  I sent the request as requested to the office.

## 2019-05-31 NOTE — Telephone Encounter (Signed)
Pt called and states he would like to be called on his cell phone [phone]

## 2019-06-05 ENCOUNTER — Telehealth: Payer: Self-pay | Admitting: *Deleted

## 2019-06-05 NOTE — Telephone Encounter (Signed)
See results note. 

## 2019-06-05 NOTE — Telephone Encounter (Signed)
Copied from [place] [id number]. Topic: General - Inquiry >> Jun 01, 2019  9:30 AM Mathis Bud wrote: Reason for CRM: Patient missed a call from nurse to go over lab results.  Line was busy sent CRM. Patient call back [phone]

## 2019-06-08 ENCOUNTER — Ambulatory Visit (INDEPENDENT_AMBULATORY_CARE_PROVIDER_SITE_OTHER): Payer: Medicare HMO | Admitting: Family Medicine

## 2019-06-08 ENCOUNTER — Telehealth: Payer: Self-pay | Admitting: *Deleted

## 2019-06-08 ENCOUNTER — Other Ambulatory Visit: Payer: Self-pay

## 2019-06-08 ENCOUNTER — Encounter: Payer: Self-pay | Admitting: Family Medicine

## 2019-06-08 DIAGNOSIS — E790 Hyperuricemia without signs of inflammatory arthritis and tophaceous disease: Secondary | ICD-10-CM

## 2019-06-08 DIAGNOSIS — R739 Hyperglycemia, unspecified: Secondary | ICD-10-CM | POA: Diagnosis not present

## 2019-06-08 NOTE — Telephone Encounter (Signed)
I called the pt and scheduled a follow up visit for 11/10.

## 2019-06-08 NOTE — Telephone Encounter (Signed)
-----   Message from Lucretia Kern, DO sent at 06/08/2019 11:39 AM EDT ----- -lab appt day of his CPE with Dr. Ethlyn Gallery - I ordered uric acid to recheck-follow up with me in 4 months

## 2019-06-08 NOTE — Progress Notes (Signed)
Virtual Visit via Video Note  I connected with Logan Sanchez  on 06/08/19 at 10:20 AM EDT by a video enabled telemedicine application and verified that I am speaking with the correct person using two identifiers.  Location patient: home Location provider:work or home office Persons participating in the virtual visit: patient, provider  I discussed the limitations of evaluation and management by telemedicine and the availability of in person appointments. The patient expressed understanding and agreed to proceed.   HPI:  THis is an acute visit for follow up to discuss recent labs. His Uric acid and hgba1c were both up considerably on recent labs. Uric acid from ~ 8 --> 10 and HgbA1c from 5.8 --> 6.6. He admits that he has not been taking the allopurinol on a regular basis. Misses a lot of doses. He reports his diet is not good. Just finished hot cakes with with extra syrup. He eats a lot of sweets. Denies polyuria, vision changes, joint pains, swelling of joints.  ROS: See pertinent positives and negatives per HPI.  Past Medical History:  Diagnosis Date  . Alcohol abuse   . Angina pectoris (HCC) 12/09/2015  . Brain injury [facility](HCC)    report on disability for this, sees Dr. Arvilla MarketShwartz  . Coronary artery disease    a. cath 12/11/15 99% stenosed mild LAD s/p overlapping drug-eluting stents; mid Cx 80% stenosed s/p Synergy drug-eluting stent; pro RCA 70% - medical managment for now, could consider cath  . Depression   . Elevated LFTs   . Erectile dysfunction   . Hyperlipemia   . Hypertension   . Mood disorder (HCC)    depression and GAD  . Obesity   . Rheumatoid arthritis involving right hip [facility](HCC)     Past Surgical History:  Procedure Laterality Date  . CARDIAC CATHETERIZATION N/A 12/11/2015   Procedure: Left Heart Cath and Coronary Angiography;  Surgeon: Corky CraftsJayadeep S Varanasi, MD;  [facility];  Service: Cardiovascular;  Laterality: N/A;  . CARDIAC CATHETERIZATION N/A 12/11/2015   Procedure: Coronary Stent Intervention;  Surgeon: Corky CraftsJayadeep S Varanasi, MD;  [facility];  Service: Cardiovascular;  Laterality: N/A;  . CORONARY ANGIOPLASTY    . FOREARM FRACTURE SURGERY Right ~ 1998   "I've got a metal rod in it"  . FRACTURE SURGERY      Family History  Problem Relation Age of Onset  . Hypertension Mother   . Hypertension Father   . Heart attack Maternal Grandmother   . Hypertension Maternal Grandmother   . Stroke Maternal Grandmother   . Heart attack Maternal Grandfather   . Hypertension Maternal Grandfather   . Hypertension Paternal Grandmother   . Hypertension Paternal Grandfather     SOCIAL HX:see hpi   Current Outpatient Medications:  .  allopurinol (ZYLOPRIM) 100 MG tablet, Take 2 tabs daily, Disp: 60 tablet, Rfl: 0 .  amLODipine (NORVASC) 10 MG tablet, Take 1 tablet (10 mg total) by mouth daily., Disp: 90 tablet, Rfl: 3 .  aspirin EC 81 MG tablet, Take 1 tablet (81 mg total) by mouth daily., Disp: 90 tablet, Rfl: 3 .  atorvastatin (LIPITOR) 40 MG tablet, TAKE 1 TABLET (40 MG TOTAL) DAILY BY MOUTH., Disp: 90 tablet, Rfl: 3 .  clopidogrel (PLAVIX) 75 MG tablet, Take 1 tablet (75 mg total) by mouth daily with breakfast. Pt needs to make appt to get more refills, Disp: 90 tablet, Rfl: 0 .  fenofibrate (TRICOR) 145 MG tablet, Take 1 tablet (145 mg total) by  mouth daily., Disp: 90 tablet, Rfl: 3 .  isosorbide mononitrate (IMDUR) 60 MG 24 hr tablet, Take 1 tablet (60 mg total) by mouth daily., Disp: 90 tablet, Rfl: 3 .  losartan-hydrochlorothiazide (HYZAAR) 100-25 MG tablet, TAKE 1 TABLET BY MOUTH EVERY DAY, Disp: 90 tablet, Rfl: 1 .  metoprolol succinate (TOPROL-XL) 100 MG 24 hr tablet, Take 1 tablet (100 mg total) by mouth daily. Take with or immediately following a meal., Disp: 90 tablet, Rfl: 3 .  Misc Natural Products (GINSENG COMPLEX PO), Take 1 tablet by mouth daily., Disp: , Rfl:  .  nitroGLYCERIN (NITROSTAT) 0.4 MG SL tablet, Place 1  tablet (0.4 mg total) under the tongue every 5 (five) minutes as needed for chest pain., Disp: 25 tablet, Rfl: 3 .  pantoprazole (PROTONIX) 40 MG tablet, Take 1 tablet (40 mg total) by mouth daily., Disp: 90 tablet, Rfl: 3 .  PARoxetine (PAXIL) 20 MG tablet, Take 1 tablet (20 mg total) by mouth daily., Disp: 90 tablet, Rfl: 1  EXAM:  VITALS per patient if applicable:  GENERAL: alert, oriented, appears well and in no acute distress  HEENT: atraumatic, conjunttiva clear, no obvious abnormalities on inspection of external nose and ears  NECK: normal movements of the head and neck  LUNGS: on inspection no signs of respiratory distress, breathing rate appears normal, no obvious gross SOB, gasping or wheezing  CV: no obvious cyanosis  MS: moves all visible extremities without noticeable abnormality  PSYCH/NEURO: pleasant and cooperative, no obvious depression or anxiety, speech and thought processing grossly intact  ASSESSMENT AND PLAN:  Discussed the following assessment and plan:  Hyperglycemia  Morbid obesity (HCC)  Elevated blood uric acid level  Discussed implications, risks, management of the gout and diabetes at length. He is very anxious about the diabetes and this diagnosis seems to be a motivator for him to be more compliant with his medications and to also work on a healthy diet and adding some exercise. We discussed the components of a healthy low sugar diet and discussed some specific nurtient rich, low sugar meal options and beverage options. Also again advised using the pill box, putting it in a location he uses daily and discussed the risks of him missing his medication doses. He feels he can do better. He has a CPE and appt with Dr. Ethlyn Gallery soon. I put in a uric acid lab to check at his CPE. Advised follow up with me in about 4 months.   I discussed the assessment and treatment plan with the patient. The patient was provided an opportunity to ask questions and all were  answered. The patient agreed with the plan and demonstrated an understanding of the instructions.   The patient was advised to call back or seek an in-person evaluation if the symptoms worsen or if the condition fails to improve as anticipated.   Follow up instructions: Advised assistant Wendie Simmer to help patient arrange the following: -lab appt day of his CPE with Dr. Ethlyn Gallery - I ordered uric acid to recheck -follow up with me in 4 months  Lucretia Kern, DO

## 2019-06-08 NOTE — Patient Instructions (Signed)
TAKE YOUR MEDICATIONS EVERY DAY.   Eat a healthy clean diet.   TRY TO EAT: -at least 5-7 servings of low sugar, colorful, and nutrient rich vegetables per day  -berries are the best choice if you wish to eat fruit (only eat small amounts if trying to reduce weight)  -lean meets (fish, white meat of chicken or [place]) -vegan proteins for some meals - beans or tofu, whole grains, nuts and seeds -Replace bad fats with good fats - good fats include: fish, nuts and seeds, canola oil, olive oil -small amounts of low fat or non fat dairy -small amounts of100 % whole grains - check the lables -drink plenty of water  AVOID: -SUGAR, sweets, anything with added sugar, corn syrup or sweeteners - must read labels as even foods advertised as "healthy" often are loaded with sugar -if you must have a sweetener, small amounts of stevia may be best -sweetened beverages and artificially sweetened beverages -simple starches (rice, bread, corn, potatoes, pasta, chips, etc - small amounts of 100% whole grains are ok) -red meat, pork, butter -fried foods, fast food, processed food, excessive dairy, eggs and coconut.  Get at least 150 minutes of sweaty aerobic exercise per week.

## 2019-06-12 ENCOUNTER — Encounter: Payer: Self-pay | Admitting: Family Medicine

## 2019-06-12 DIAGNOSIS — H40023 Open angle with borderline findings, high risk, bilateral: Secondary | ICD-10-CM | POA: Diagnosis not present

## 2019-06-14 ENCOUNTER — Other Ambulatory Visit: Payer: Self-pay | Admitting: Interventional Cardiology

## 2019-06-19 ENCOUNTER — Telehealth: Payer: Self-pay | Admitting: Interventional Cardiology

## 2019-06-19 NOTE — Telephone Encounter (Signed)
New Message ° ° ° °Patient is returning call in reference to lab results. Please call.  °

## 2019-06-19 NOTE — Telephone Encounter (Signed)
Left message for patient to call back  

## 2019-06-23 ENCOUNTER — Ambulatory Visit (INDEPENDENT_AMBULATORY_CARE_PROVIDER_SITE_OTHER): Payer: Medicare HMO | Admitting: Family Medicine

## 2019-06-23 ENCOUNTER — Encounter: Payer: Self-pay | Admitting: Family Medicine

## 2019-06-23 ENCOUNTER — Other Ambulatory Visit: Payer: Self-pay

## 2019-06-23 DIAGNOSIS — I10 Essential (primary) hypertension: Secondary | ICD-10-CM

## 2019-06-23 DIAGNOSIS — R739 Hyperglycemia, unspecified: Secondary | ICD-10-CM | POA: Diagnosis not present

## 2019-06-23 DIAGNOSIS — F3342 Major depressive disorder, recurrent, in full remission: Secondary | ICD-10-CM | POA: Diagnosis not present

## 2019-06-23 DIAGNOSIS — E782 Mixed hyperlipidemia: Secondary | ICD-10-CM

## 2019-06-23 DIAGNOSIS — S069X9S Unspecified intracranial injury with loss of consciousness of unspecified duration, sequela: Secondary | ICD-10-CM

## 2019-06-23 NOTE — Progress Notes (Signed)
Virtual Visit via Video Note  I connected with Logan BrilliantBrian G Beumer  on 06/23/19 at 11:30 AM EDT by a video enabled telemedicine application and verified that I am speaking with the correct person using two identifiers.  Location patient: home Location provider:work office Persons participating in the virtual visit: patient, provider  I discussed the limitations of evaluation and management by telemedicine and the availability of in person appointments. The patient expressed understanding and agreed to proceed.   Logan Sanchez DOB: 08/03/1971 Encounter date: 06/23/2019  This is a 48 y.o. male who presents to establish care. Chief Complaint  Patient presents with  . Establish Care   Last visit with [facility] was 05/2019 for follow up on labwork. Sugar and uric acid were elevated.  Has cpe scheduled with me in August. Will get bloodwork at that time.   History of present illness:  CAD:on imdur,hyzaar,toprol. Follows with cardiology.   ZOX:[id number]:checks on occasion; norvasc 10mg , others above. Was taking double doses of medication because meds were getting refilled automatically and he just kept taking on bottle sig but didn't realize he was doubling up on meds.   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 Was partying/celebrating after getting job offer and he jumped [place] of car on [place] after getting mad about [employer] sandwich not being made properly. Took tumble out of car, brain swelling, had to induce coma to save him. Was in the hospital for a month. Was with specialist for follow up but was cleared for stability about 6 years ago.   Depression: does take paxil. Feels that mood is stable.  Alcohol abuse: does not drink daily. Does usually drink on weekends. Usually will drink about 2 on weekends. Used to go to liquor store daily but does not drink regularly any longer. There are multiple days during week he does not drink.   Hyperglycemia: Has a difficult time working on healthier  eating.  Drinks multiple sodas daily -at least 5 mellow yellows, snacks, difficulty with limiting carbohydrates.  Hyperlipidemia:Lipitor 40 mg.  Did get bike so has been trying to do this regularly. Trying to do 15-20 minutes/day. Tries to do every day.  Has been using this for the last couple of months.    Past Medical History:  Diagnosis Date  . Alcohol abuse   . Angina pectoris (HCC) 12/09/2015  . Brain injury [facility](HCC)    report on disability for this, sees Dr. Arvilla MarketShwartz  . Coronary artery disease    a. cath 12/11/15 99% stenosed mild LAD s/p overlapping drug-eluting stents; mid Cx 80% stenosed s/p Synergy drug-eluting stent; pro RCA 70% - medical managment for now, could consider cath  . Depression   . Elevated LFTs   . Erectile dysfunction   . Hyperlipemia   . Hypertension   . Mood disorder (HCC)    depression and GAD  . Obesity   . Rheumatoid arthritis involving right hip [facility](HCC)    Past Surgical History:  Procedure Laterality Date  . CARDIAC CATHETERIZATION N/A 12/11/2015   Procedure: Left Heart Cath and Coronary Angiography;  Surgeon: Corky CraftsJayadeep S Varanasi, MD;  [facility];  Service: Cardiovascular;  Laterality: N/A;  . CARDIAC CATHETERIZATION N/A 12/11/2015   Procedure: Coronary Stent Intervention;  Surgeon: Corky CraftsJayadeep S Varanasi, MD;  [facility];  Service: Cardiovascular;  Laterality: N/A;  . CORONARY ANGIOPLASTY    . FOREARM FRACTURE SURGERY Right ~ 1998   "I've got a metal rod in it"  .  FRACTURE SURGERY     No Known Allergies Current Meds  Medication Sig  . allopurinol (ZYLOPRIM) 100 MG tablet Take 2 tabs daily  . amLODipine (NORVASC) 10 MG tablet Take 1 tablet (10 mg total) by mouth daily.  Marland Kitchen. aspirin EC 81 MG tablet Take 1 tablet (81 mg total) by mouth daily.  Marland Kitchen. atorvastatin (LIPITOR) 40 MG tablet TAKE 1 TABLET (40 MG TOTAL) DAILY BY MOUTH.  . clopidogrel (PLAVIX) 75 MG tablet Take 1 tablet (75 mg total) by mouth daily with breakfast.  .  fenofibrate (TRICOR) 145 MG tablet Take 1 tablet (145 mg total) by mouth daily.  . isosorbide mononitrate (IMDUR) 60 MG 24 hr tablet Take 1 tablet (60 mg total) by mouth daily.  Marland Kitchen. losartan-hydrochlorothiazide (HYZAAR) 100-25 MG tablet TAKE 1 TABLET BY MOUTH EVERY DAY  . metoprolol succinate (TOPROL-XL) 100 MG 24 hr tablet Take 1 tablet (100 mg total) by mouth daily. Take with or immediately following a meal.  . Misc Natural Products (GINSENG COMPLEX PO) Take 1 tablet by mouth daily.  . nitroGLYCERIN (NITROSTAT) 0.4 MG SL tablet Place 1 tablet (0.4 mg total) under the tongue every 5 (five) minutes as needed for chest pain.  . pantoprazole (PROTONIX) 40 MG tablet Take 1 tablet (40 mg total) by mouth daily.  Marland Kitchen. PARoxetine (PAXIL) 20 MG tablet Take 1 tablet (20 mg total) by mouth daily.   Social History   Tobacco Use  . Smoking status: Former Smoker    Packs/day: 1.50    Years: 25.00    Pack years: 37.50    Types: Cigarettes    Quit date: 09/30/2014    Years since quitting: 4.7  . Smokeless tobacco: Never Used  Substance Use Topics  . Alcohol use: Yes    Alcohol/week: 6.0 standard drinks    Types: 6 Cans of beer per week    Comment: 03/08/2017 drinks small beers rarely; no return to heavy drinking    Family History  Problem Relation Age of Onset  . Hypertension Mother   . Hypertension Father   . Heart attack Maternal Grandmother   . Hypertension Maternal Grandmother   . Stroke Maternal Grandmother   . Heart attack Maternal Grandfather   . Hypertension Maternal Grandfather   . Hypertension Paternal Grandmother   . Hypertension Paternal Grandfather      Review of Systems  Constitutional: Negative for chills, fatigue and fever.  Respiratory: Negative for cough, chest tightness, shortness of breath and wheezing.   Cardiovascular: Negative for chest pain, palpitations and leg swelling.    Objective:  There were no vitals taken for this visit.      BP Readings from Last 3  Encounters:  04/18/19 128/85  01/10/19 136/78  12/27/18 102/82   Wt Readings from Last 3 Encounters:  03/06/19 285 lb (129.3 kg)  02/06/19 285 lb (129.3 kg)  01/10/19 285 lb (129.3 kg)    EXAM:  GENERAL: alert, oriented, appears well and in no acute distress  HEENT: atraumatic, conjunctiva clear, no obvious abnormalities on inspection of external nose and ears  NECK: normal movements of the head and neck  LUNGS: on inspection no signs of respiratory distress, breathing rate appears normal, no obvious gross SOB, gasping or wheezing  PSYCH/NEURO: pleasant and cooperative, no obvious depression or anxiety, speech and thought processing grossly intact   Assessment/Plan  1. Essential hypertension Continue current medications.  Will recheck at physical in August.  We did discuss keeping medications in a pillbox.  He has  discussed this with Dr. Maudie Mercury, but never started using one.  He has a pillbox at home.  We discussed that it can be very dangerous for him to take more than the prescribed doses of his medications (ie when he was recently doubling up on doses because he didn't realize that pharmacy was autorefilling meds for him), so sitting down and separating out his pills to help avoid confusion is very important.  2. Hyperglycemia We discussed cutting back or eliminating sodas.  He is interested in doing some naturally flavored waters.  He asked about purchasing these, but we discussed that he may be able to save money by making these on his own and using slices of lemon or cucumber to flavor waters.  We discussed that even with just eliminating this alone and no other changes in diet his sugars will be better and he should see some weight loss.  We also discussed that regular exercise is very helpful for controlling blood sugars.  Encouraged him to challenge himself and add on minutes to his bike riding or consider riding his bicycle twice daily to help increase amount of exercise.  3.  Mixed hyperlipidemia On Lipitor and fenofibrate.  4. Brain injury with loss of consciousness, sequela ([place]) He is on disability for this brain injury.  He is able to live independently.  Not working.  5. Recurrent major depressive disorder, in full remission ([place]) Mood has been stable.  Continue Paxil 20 mg.  6. Morbid obesity ([place]) Encouraged him to keep up with regular exercise.  Continue to try to cut back on excess calories in the diet.  Will recheck weight at upcoming visit.    Return august for physical.   I discussed the assessment and treatment plan with the patient. The patient was provided an opportunity to ask questions and all were answered. The patient agreed with the plan and demonstrated an understanding of the instructions.   The patient was advised to call back or seek an in-person evaluation if the symptoms worsen or if the condition fails to improve as anticipated.  I provided 32 minutes of non-face-to-face time during this encounter.   Micheline Rough, MD

## 2019-06-30 NOTE — Telephone Encounter (Signed)
Left message to call back  

## 2019-07-24 ENCOUNTER — Other Ambulatory Visit: Payer: Self-pay

## 2019-07-24 DIAGNOSIS — Z20822 Contact with and (suspected) exposure to covid-19: Secondary | ICD-10-CM

## 2019-07-25 LAB — NOVEL CORONAVIRUS, NAA: SARS-CoV-2, NAA: NOT DETECTED

## 2019-07-26 ENCOUNTER — Encounter: Payer: Medicare HMO | Admitting: Family Medicine

## 2019-07-31 ENCOUNTER — Telehealth: Payer: Self-pay | Admitting: Family Medicine

## 2019-08-01 NOTE — Telephone Encounter (Signed)
Received call this morning from EMT at residence of [place]. 2am found unresponsive.  States that patient was found to be in asystole. CPR/resuscitation was performed for 25 minutes without achieving rhythm.   He had been complaining of SOB for a few days but no other current symptoms/history known.   I did agree to sign death certificate.

## 2019-08-01 DEATH — deceased

## 2019-08-10 NOTE — Telephone Encounter (Signed)
Upon reviewing the patient's chart it appears the patient passed away on 08-02-23. We will send a sympathy card.

## 2019-10-10 ENCOUNTER — Ambulatory Visit: Payer: Medicare HMO | Admitting: Family Medicine
